# Patient Record
Sex: Female | Born: 1945
Health system: Southern US, Community
[De-identification: ages and names within clinical notes are randomized; demographics above are authoritative.]

## PROBLEM LIST (undated history)

## (undated) DIAGNOSIS — R51 Headache: Secondary | ICD-10-CM

## (undated) DIAGNOSIS — G8929 Other chronic pain: Secondary | ICD-10-CM

## (undated) DIAGNOSIS — M47815 Spondylosis without myelopathy or radiculopathy, thoracolumbar region: Secondary | ICD-10-CM

## (undated) DIAGNOSIS — Z860100 Personal history of colon polyps, unspecified: Secondary | ICD-10-CM

## (undated) DIAGNOSIS — R350 Frequency of micturition: Secondary | ICD-10-CM

## (undated) DIAGNOSIS — M549 Dorsalgia, unspecified: Secondary | ICD-10-CM

## (undated) DIAGNOSIS — R112 Nausea with vomiting, unspecified: Secondary | ICD-10-CM

## (undated) DIAGNOSIS — I219 Acute myocardial infarction, unspecified: Secondary | ICD-10-CM

## (undated) DIAGNOSIS — I10 Essential (primary) hypertension: Secondary | ICD-10-CM

## (undated) DIAGNOSIS — R252 Cramp and spasm: Secondary | ICD-10-CM

## (undated) DIAGNOSIS — E785 Hyperlipidemia, unspecified: Secondary | ICD-10-CM

## (undated) DIAGNOSIS — K769 Liver disease, unspecified: Secondary | ICD-10-CM

## (undated) DIAGNOSIS — M779 Enthesopathy, unspecified: Secondary | ICD-10-CM

## (undated) DIAGNOSIS — M4310 Spondylolisthesis, site unspecified: Secondary | ICD-10-CM

## (undated) DIAGNOSIS — E039 Hypothyroidism, unspecified: Secondary | ICD-10-CM

## (undated) HISTORY — DX: Hyperlipidemia, unspecified: E78.5

## (undated) HISTORY — DX: Spondylosis without myelopathy or radiculopathy, thoracolumbar region: M47.815

## (undated) HISTORY — DX: Liver disease, unspecified: K76.9

## (undated) HISTORY — DX: Hypothyroidism, unspecified: E03.9

## (undated) HISTORY — PX: COLONOSCOPY: SHX174

## (undated) NOTE — Unmapped External Note (Signed)
 Formatting of this note might be different from the original. VSS.  No acute changes.  Pt is on RA.  She has denied any chest pain, dizziness or heart palpitations.  Cleared to discharge by physician.  All belongings are w/ pt.  She has been educated on all new medications and follow-up appointments.  Problem: Infection Risk Goal: Stabilize- Infection Risk Description: Increased chance of contamination with disease-producing germs. Outcome: Adequate for Discharge  Problem: Fall Risk Goal: Stabilize- Fall Risk Description: Increased chance of conditions that results in falls. Outcome: Adequate for Discharge  Electronically signed by Almetta Fitch, RN at 10/19/2023  2:51 PM CDT

## (undated) NOTE — ED Provider Notes (Signed)
 Formatting of this note is different from the original.  History of Present Illness  Mode of arrival: EMS History was obtained from: Patient   Chief Complaint  Patient presents with   Chest Pain    Chest heaviness started during the night with diaphoresis, had MI in December 2024, stent in place   60 y.o. female patient visiting from [place] , with a PMHx of MI 01/2023 with 2 stent placement requirement, who is brought to ED by EMS due to chest heaviness that started this morning at around 4 AM. States she felt non-radiating chest heaviness, nausea, did vomit, and became diaphoretic. She became concerned as her prior MI presented somewhat similarly. Endorses SOB stating she has been SOB ever since last MI. States she recently had an appointment with a pulmonologist back in [place] to establish care. Denies use of CPAP and/or other oxygen  supplementation requirement. Denies cough, sore throat, fever, chills. Denies falls, and/or chest wall trauma. Denies abdominal pain, urinary symptoms. No other concerns.   My review of outside medical records: Unavailable.  PMHx   Past Medical History[1] No current outpatient medications on file.   PSHx   Past Surgical History[2]  Family Hx   Reviewed. None pertinent.  Family History[3]  Social Hx   Non-smoker, Denies drug use, DeniesEtOH use Social History[4]  Allergies   Reviewed by me.  Allergies[5]   Review of Systems   Please see HPI for all pertinent positives and negatives. All other systems reviewed and are negative.  Physical Exam  BP 128/75 (BP Location: Right arm, Patient Position: Lying)   Pulse 69   Temp 98.8 F (37.1 C) (Oral)   Resp 17   Ht 1.626 m (5' 4)   Wt 47.5 kg (104 lb 12.8 oz)   SpO2 97%   BMI 17.99 kg/m   Constitutional: Alert and Oriented x 3. Appears well-developed and well-nourished. No acute distress.  HEENT:  Head: Normocephalic and atraumatic.  Eyes: Conjunctivae without abnormality. EOM  intact.  Ears: Normal external inspection.  Nose: Normal external inspection. Mouth/Throat: External mouth normal.  Neck: Normal range of motion. Neck supple.  Cardiovascular: Normal rate, regular rhythm. No murmurs, rubs or gallops. Bilateral radial pulses present and equal. Pulmonary/Chest: No respiratory distress. Clear to ascultation bilaterally. No wheezes, rales, or rhonchi. Abdominal: abdomen soft, non-distended, non-tender to palpation.  Musculoskeletal: Normal ROM. Steady gait. No edema.  Neurological: Alert. Normal coordination.  Skin: Skin is warm. No rash noted.  Psychiatric: Affect is appropriate.   Nursing notes and vitals reviewed.  LABS   Labs Reviewed  COMPLETE BLOOD COUNT WITH DIFFERENTIAL - Abnormal; Notable for the following components:      Result Value   WBC 12.5 (*)    Neutrophils Absolute 9.34 (*)    Monocytes # 1.04 (*)    All other components within normal limits  COMPREHENSIVE METABOLIC PANEL - Abnormal; Notable for the following components:   Glucose 153 (*)    Anion Gap 16 (*)    All other components within normal limits  D-DIMER QUANTITATIVE - Abnormal; Notable for the following components:   D Dimer Quant 0.64 (*)    All other components within normal limits   Narrative:    The quantitative D-Dimer assay has a high NEGATIVE predictive value for deep vein thrombosis and acute pulmonary embolus, however, the positive predictive value is poor. If the quantitative D-Dimer is less than 0.5 mg/L FEU then there is a low probability of PE and DVT.  Elevated D-Dimer  may have multiple other etiologies, for example, surgery, liver disease, trauma, DIC, pregnancy, etc.  For work-up of DIC, please order the DIC evaluation.   TROPONIN I HIGH SENSITIVITY - Abnormal; Notable for the following components:   Troponin I High Sensitivity 37 (*)    All other components within normal limits  TROPONIN I HIGH SENSITIVITY - Abnormal; Notable for the following components:    Troponin I High Sensitivity 43 (*)    All other components within normal limits  TSH + T4 THYROXINE FREE - Abnormal; Notable for the following components:   Thyroid  Stimulating Hormone 0.054 (*)    All other components within normal limits  TROPONIN I HIGH SENSITIVITY - Abnormal; Notable for the following components:   Troponin I High Sensitivity 42 (*)    All other components within normal limits  COMPLETE BLOOD COUNT WITH DIFFERENTIAL - Abnormal; Notable for the following components:   Monocytes # 0.90 (*)    All other components within normal limits  BASIC METABOLIC PANEL - Abnormal; Notable for the following components:   Glucose 169 (*)    All other components within normal limits  TROPONIN I HIGH SENSITIVITY - Normal  VENOUS BLOOD GASES, LAB - Normal  NT-PRO B-TYPE BNP   IMAGING   CTA Chest PE protocol w Contrast  Final Result  Impression:  No evidence of pulmonary embolus.     Note: This CT exam was performed using one or more of the following  dose reduction techniques: Automated exposure control, adjustment of  the mA and/or kV according to patient size, or use of iterative  reconstruction technique   Signed by: Dorn Finney MD on 10/19/2023 11:08 AM MDT  Reading Workstation:[id number]   X-ray Portable Chest 1 view  Final Result  Findings and Impression:       No evidence of focal consolidation. The cardiomediastinal  silhouette is within normal limits.  Leads overlie the right lower  chest and there appears to be a spinal stimulator in the midthoracic  region.     Signed by: Dorn Finney MD on 10/18/2023 11:24 AM MDT  Reading Workstation:[id number]    EKG   10/18/2023 10:54 AM - [facility], [facility] Orders And Imaging Results In      Result Text     Sinus rhythm with premature atrial complexes Anterior infarct , age undetermined Abnormal ECG No previous ECGs available Confirmed by Carlette Rigg ([id number]) on 10/18/2023 10:54:23  AM      ED MEDS   All Medication Orders (From admission, onward)    Start Ordered     Status Ordering Provider   10/19/23 1003 10/19/23 1003  iopamidol (ISOVUE-370) 76 % injection 100 mL  IMG once as needed       Route: Intravenous  Ordered Dose: 100 mL    Last MAR action: Given PITEO, BRIAN   10/19/23 0900 10/18/23 1513  tiotropium (Spiriva  HandiHaler) capsule 18 mcg  Daily (Respiratory Use)       Route: Inhalation  Ordered Dose: 18 mcg    Last MAR action: Given DE SOUZA SANTOS, MARIA   10/19/23 0900 10/19/23 0800  amLODIPine  (Norvasc ) tablet 5 mg  Daily       Route: Oral  Ordered Dose: 5 mg    Last MAR action: Given Late PITEO, BRIAN   10/19/23 0800 10/18/23 1513  gabapentin  (Neurontin ) capsule 300 mg  3 times daily       Route: Oral  Ordered Dose: 300 mg    Last MAR action: Given  DE SOUZA SANTOS, MARIA   10/19/23 0630 10/18/23 1513  levothyroxine  (Synthroid ) tablet 100 mcg  Every morning at 0630       Route: Oral  Ordered Dose: 100 mcg    Last MAR action: Given DE SOUZA SANTOS, MARIA   10/18/23 2100 10/18/23 1513  losartan  (Cozaar ) tablet 100 mg  Nightly       Route: Oral  Ordered Dose: 100 mg    Last MAR action: Given DE SOUZA SANTOS, MARIA   10/18/23 2100 10/18/23 1513  metoprolol  succinate (Toprol -XL) 24 hr tablet 25 mg  Nightly       Route: Oral  Ordered Dose: 25 mg    Last MAR action: Given DE SOUZA SANTOS, MARIA   10/18/23 2100 10/18/23 1513  prasugrel  (Effient ) tablet 10 mg  Nightly       Route: Oral  Ordered Dose: 10 mg    Last MAR action: Given DE SOUZA SANTOS, MARIA   10/18/23 2100 10/18/23 1513  hydrOXYzine  (Atarax ) tablet 25 mg  Nightly PRN       Route: Oral  Ordered Dose: 25 mg    Last MAR action: Given DE SOUZA SANTOS, MARIA   10/18/23 1536 10/18/23 1536  ondansetron  (Zofran ) injection 4 mg  Every 6 hours PRN       Route: Intravenous  Ordered Dose: 4 mg    Acknowledged DE SOUZA SANTOS, MARIA   10/18/23 1515 10/18/23 1513  aspirin  chewable tablet 81  mg  Daily       Route: Oral  Ordered Dose: 81 mg    Last MAR action: Given DE SOUZA SANTOS, MARIA   10/18/23 1515 10/18/23 1513  DULoxetine  (Cymbalta ) DR capsule 60 mg  Daily       Route: Oral  Ordered Dose: 60 mg    Last MAR action: Given DE SOUZA SANTOS, MARIA   10/18/23 1515 10/18/23 1513  rosuvastatin  (Crestor ) tablet 20 mg  Daily       Route: Oral  Ordered Dose: 20 mg    Last MAR action: Given DE SOUZA SANTOS, MARIA   10/18/23 1512 10/18/23 1513  oxyCODONE -acetaminophen  (Percocet) 5-325 MG per tablet 1 tablet  2 times daily PRN       Route: Oral  Ordered Dose: 1 tablet    Last MAR action: Given DE SOUZA SANTOS, MARIA   10/18/23 1512 10/18/23 1513  hydrALAZINE  (Apresoline ) tablet 25 mg  3 times daily PRN       Route: Oral  Ordered Dose: 25 mg    Last MAR action: Given DE SOUZA SANTOS, MARIA   10/18/23 1510 10/18/23 1507  enoxaparin (Lovenox) syringe 40 mg  Daily       Route: Subcutaneous  Ordered Dose: 40 mg    Last MAR action: Given DE ELIN HEROLD IP     ED COURSE     ED Course as of 10/19/23 1340  Sun Oct 18, 2023  0924 Complete Blood Count with Differential(!) CBC/Diff with mild leukocytosis, negative for anemia requiring ED intervention. [CM]  1019 Plan of care discussed with Dr. Ottie [CM]  1023 EKG 12 lead My independent review and interpretation of EKG indicative of sinus rhythm, heart rate of 82 bpm, no ST elevations.  Left axis deviation. [CM]  1024 Comprehensive Metabolic Panel(!) CMP largely within normal limits.  Good renal function.  Negative for transaminitis. [CM]  1024 Troponin I High Sensitivity High-sensitivity troponin 34  [CM]  1122 Pro BNP [CM]  1123 D-Dimer Quantitative(!): 0.64 D-dimer mildly elevated per  years algorithm, PE can be excluded.  Given patient's clinical presentation there is very low index suspicion for PE. [CM]  1124 X-ray Portable Chest 1 view My independent review interpretation of chest x-ray imaging, bilateral lungs are  visualized and well-expanded. Negative for pneumothorax.  Negative for osseous misalignment.  Cardiomediastinal silhouette within normal limits.  [CM]  1212 Troponin I High Sensitivity(!!): 37 Repeat troponin 37, slightly higher than previous obtained which was at 34.  Discussed with patient.  Shared decision making conversation regarding admission.  Patient is agreeable to plan. [CM]  1243 Plan for admission discussed with hospitalist.  All amenable to plan. [CM]   ED Course User Index [CM] Marin-Villalobos, Will RIGGERS   MDM  This is a 72 y.o. female patient visiting from [place] , with a PMHx of HLD, HTN, MI 01/2023 with 2 stent placement requirement, who is brought to ED by EMS due to chest heaviness that started this morning. Patient's history and presentation raise concern for MI/ACS, vs./Less likely PE, vs. pneumonia, and/or other acute infectious process. Considering patient's history of MI, and high risk for ACS as well as patient subjectively reporting her symptoms are similar to current symptoms. Patient is considered for admission for further cardiology evaluation.  Please see ED course for my independent review of radiology images, laboratory results, and disposition.   The decision to admit the patient involves complex medical judgment, taking into account their medical history, comorbidities, symptom severity, potential risks of non-admission, and current medical needs. Mrs. Cerreta's signs and symptoms are severe enough to require inpatient care that can be provided safely and effectively. Shared decision making conversation with the patient on decisions regarding care took place. She is amenable to plan.  My interpretation of bedside monitor: Normal sinus rhythm. Normal rate. No cardiac events noted during ED stay.   ED return precautions: Patient was advised to return to Emergency Department with new, worsening, and/or concerning symptoms.      Clinical Impression   1.  Chest heaviness     2. History of MI (myocardial infarction)      ED Dispo  Admit  Dr.Kinlaw was available for supervision.  Current Discharge Medication List     Follow-up Information     [facility]. Go today.   Specialty: Emergency Medicine Why: If symptoms worsen Contact information: [address] [place]  [address] [phone]          Dragon Disclaimer: This document was created using Dragon voice to text technology. Please excuse minor typos, word substitutions and errors in grammar and punctuation. Barring these minor misprints, the content of this document is true to the context of patient history and the care episode. Please contact me if corrections or clarifications are necessary.     [1] History reviewed.  No pertinent past medical history. [2] No past surgical history on file. [3] No family history on file. [4]  Social History Tobacco Use   Smoking status: Never   Smokeless tobacco: Never  Substance Use Topics   Alcohol  use: Never   Drug use: Never  [5]  Allergies Allergen Reactions   Penicillins Itching   Bee Venom Itching, Rash and Swelling   Sulfisoxazole Itching    Marin-Villalobos, Will, PA-C 10/19/23 1340  Electronically signed by Mina Will, PA-C at 10/19/2023  2:40 PM CDT

## (undated) NOTE — Progress Notes (Signed)
 Formatting of this note might be different from the original. Care Management Initial Assessment Patient Name: Amanda Kim MRN: [id number] Visit Number (CSN): [id number] Insurance: Payor: /   Plan:  Patient's information was reviewed by chart review. Patient is being followed for Chest heaviness [R07.89] NSTEMI (non-ST elevated myocardial infarction) (HC Category) [I21.4] History of MI (myocardial infarction) [I25.2] .   She is admitted under Observation  status. Current risk of readmission is 9 %.   Met with patient at bedside. She is a 54 y/o A/O Female who resides in [place]  . Patient lives with spouse . Verbal consent was given from patient to speak to husband if needed.  Patient states She has a good support system with her husband.   Patient reports that prior to this admission he was with Independent  ADL's and used No DME      Primary care physician is not collected. Health care coverage is through  Medicare advantage plan .    Discharge plan to be determined dependent on pt's clinical progress. CM discussed discharge care levels which include IRU ,SNF, outpatient therapy and home health services (guide to the next level of care given).    CM anticipating discharge Home     Family to Provide Transport  on discharge.   CM updated white board with Case Manager's name, contact information, and discharge planning information.   CM will continue to follow patient for any discharge needs.   Assessed Patient By Assessed the patient by: (P) In person interview;Medical record review Name: (P) Emmarie Contact's Relationship to Patient: (P) Patient  Interpreter Services Does the patient or family require interpretation?: (P) No Did the patient decline [facility] approved interpretation?: (P) No  Demographics Admitted from: (P) ED Who assists you with performing your ADLs?: (P) Self  Patient Information ADL Prior to Hospitalization: (P) Independent Current  living situation: (P) House Living Arrangements: (P) Spouse / Significant Other Support System: (P) Immediate family Responsibilities/Dependents at home: (P) No Currently using DME?: (P) No Needs additional DME?: (P) No Current Post-Acute Services: (P) N/A Currently receiving outpatient dialysis?: (P) No  Discharge Plan Anticipated discharge plan: (P) Home Transport to discharge destination is: (P) Health and safety inspector arrangements finalized?: (P) Yes  Financial Information Insurance listed correct?: (P) No Insurance actions taken: (P) Registration needs to collect information. Barriers to obtaining healthcare: (P) No Resources provided?: (P) No  Clinical Information / Patient History Have you been hospitalized within the last year?: (P) Yes How long ago?: (P) 6 months What were you admitted for?: (P) Heart attack and neck surgery At what hospital were you admitted?: (P) [facility] Patient and/or family were provided with choice of facilities/services that are available and appropriate to meet post hospital care needs?: (P) Home - no needs Patient/Family agree with discharge plan?: (P) Yes  Completed by: Fonda Bales, LMSW   [facility] [address] [place] [address] Phone: [phone] Fax: [phone]  Electronically signed by Bales Fonda, LMSW at 10/19/2023 10:29 AM CDT

## (undated) NOTE — ED Notes (Signed)
 Formatting of this note might be different from the original. Bed: ED23 Expected date:  Expected time:  Means of arrival:  Comments: ems  Detra Iha, RN 10/18/23 9145  Electronically signed by Detra Iha, RN at 10/18/2023  9:54 AM CDT

## (undated) NOTE — Unmapped External Note (Signed)
 Formatting of this note might be different from the original.  Problem: Infection Risk Goal: Stabilize- Infection Risk Description: Increased chance of contamination with disease-producing germs. Outcome: Stabilized  Problem: Fall Risk Goal: Stabilize- Fall Risk Description: Increased chance of conditions that results in falls. Outcome: Stabilized  Electronically signed by Laymon Seltzer., RN at 10/18/2023  6:41 PM CDT

## (undated) NOTE — Discharge Summary (Signed)
 Formatting of this note is different from the original.  Physician Discharge Summary  Patient ID: Amanda Kim [id number] 78 y.o. [birth year]  Admit date: 10/18/2023  Discharge date:10/19/2023  Admitting Physician: Hadassah everitt Elin Herold, PA-C   Discharge Physician: Redell Muir, DO  Admission Diagnoses: Chest heaviness [R07.89] NSTEMI (non-ST elevated myocardial infarction) (HC Category) [I21.4] History of MI (myocardial infarction) [I25.2]  Discharge Diagnoses: Principal Problem:   NSTEMI (non-ST elevated myocardial infarction) (HC Category) Active Problems:   CAD (coronary artery disease)   Essential hypertension   Hx of chronic arthritis   Hypothyroid   Hyperlipidemia   Acute anxiety   Chronic pain   Depression  RR:[id number] (non-ST elevated myocardial infarction) [facility] Category)  Hospital Course: Patient presented to the emergency department with general unwellness and nausea.  These are symptoms similar to when she had previous MI.  She went out to dinner and did not take her blood pressure medication.  When she checked her blood pressures her systolics were in the 200s.  In the emergency department she had a slight elevation in her troponins that was thought to be secondary to her elevated blood pressure.  CT PA was negative.  Patient was back to her normal state of health on discharge and her blood pressures were controlled.  At this point, I feel patient has reached maximum benefit from this hospitalization and can be safely discharged. Patient and family were counseled about the diagnosis, prognosis and medications side effects. They have been asked to return to ER with any new or worsening symptoms. They voiced understanding and agree to comply.  Patient was seen and examined on the day of discharge and voices no complaints  Follow-up needed: -Follow-up with cardiology  Physical Exam: Vitals: Temp:  [36.7 C (98.1 F)-37.2 C (99 F)] 37.1 C (98.8 F) Heart  Rate:  [69-78] 69 Resp:  [17-18] 17 BP: (128-175)/(68-102) 128/75 GEN: well developed, well nourished, in NAD  EYES:  EOMI, no scleral icterus ENT: MMM HEART: Regular rate LUNGS: respiratory effort non-labored ABDOMEN: Nondistended SKIN: warm, dry, NEURO: No aphasia no facial asymmetry  Discharged Condition: good  Discharge Prognosis:  good  Diet: The patient is asked to make an attempt to improve diet and exercise patterns to aid in medical management of this problem. cardiac diet  Follow up with: PCP  Discharge Meds:   Medication List    CONTINUE taking these medications    aspirin  81 mg chewable tablet  DULoxetine  60 MG delayed-release particle capsule Commonly known as: Cymbalta   Effient  10 MG tablet Generic drug: prasugrel   gabapentin  300 MG capsule Commonly known as: Neurontin   hydrALAZINE  25 MG tablet Commonly known as: Apresoline   hydrOXYzine  25 MG tablet Commonly known as: Atarax   levothyroxine  100 MCG tablet Commonly known as: Synthroid   losartan  100 MG tablet Commonly known as: Cozaar   metoprolol  succinate 25 MG 24 hr sustained-release tablet Commonly known as: Toprol -XL  oxyCODONE -acetaminophen  5-325 MG tablet Commonly known as: Percocet  rosuvastatin  20 MG tablet Commonly known as: Crestor   Spiriva  Respimat 1.25 MCG/ACT aerosol inhaler Generic drug: tiotropium     Radiology/Imaging CTA Chest PE protocol w Contrast Result Date: 10/19/2023 CTA chest with contrast Clinical History: Chest heaviness;History of MI (myocardial infarction) Technique: CT angiogram of the chest with 100cc of intravenous Isovue 370 contrast. Coronal MIP images also obtained. Comparison: None. Findings: There is no pulmonary embolus. Small mediastinal and hilar lymph nodes. No pulmonary nodules or masses. Mild bilateral atelectasis. Coronary atherosclerotic disease with coronary stent in the  LAD. Gallbladder is surgically absent.  Impression: No evidence of  pulmonary embolus. Note: This CT exam was performed using one or more of the following dose reduction techniques: Automated exposure control, adjustment of the mA and/or kV according to patient size, or use of iterative reconstruction technique Signed by: Dorn Finney MD on 10/19/2023 11:08 AM MDT Reading Workstation:[id number]  X-ray Portable Chest 1 view Result Date: 10/18/2023  XR CHEST 1 VIEW PORTABLE History:  Chest pain Chest pain Comparison:  none   Findings and Impression:     No evidence of focal consolidation. The cardiomediastinal silhouette is within normal limits.  Leads overlie the right lower chest and there appears to be a spinal stimulator in the midthoracic region. Signed by: Dorn Finney MD on 10/18/2023 11:24 AM MDT Reading Workstation:[id number]  Activity: activity as tolerated  I have seen and examined this patient on the day of discharge.  Total time spent on med rec, discharge planning, with over half of the time in direct patient care >71min  Signed: Brian Piteo, DO 10/19/2023 1:49 PM MDT  Speech recognition transcription software was used to create portions of this document. An attempt at proofreading has been made to minimize errors. Please call for corrections.  Electronically signed by Laine Rogue, DO at 10/19/2023  2:54 PM CDT

## (undated) NOTE — ED Provider Notes (Signed)
 Formatting of this note is different from the original.  Attending Physician Addendum Note   I provided a substantive portion of the care of this patient. I personally performed the entirety of the MDM for this encounter. See below for pertinent documentation to the extent needed to support the assigned E/M code.  MDM   I personally made the management plan and take responsibility for the patient management.  I have interviewed the patient, reviewed the case with my physicians assistant Oscar Boas.  Patient relatively high risk for acute coronary syndrome.  Presents with chest pain nausea and vomiting.  Troponins stable however due to the patient's high heart score recommend admission to the hospital for continued observation, serial troponins and telemetry.  The patient is comfortable with this admission plan    Diagnosis   1. Chest heaviness     2. History of MI (myocardial infarction)      Disposition  Admit  Elsie MARLA Macadam, MD  Macadam Elsie MARLA, MD Physician 10/18/23 [phone]  Electronically signed by Macadam Elsie MARLA, MD at 10/18/2023  7:05 PM CDT

## (undated) NOTE — H&P (Signed)
 Formatting of this note is different from the original. Hospitalist Admission History & Physical  PCP: Csv No Pcp, Per Patient  Chief Complaint: Nausea chest pressure  HPI: Amanda Kim is a 53 y.o. female with a past medical history that includes coronary artery disease (reports 2 heart attacks within the past year, second was in December and required catheterization with placement of 2 stents), dyslipidemia, hypertension, hypothyroidism, chronic pain, COPD(?), depression who is presenting to the emergency room this morning for feeling of general unwellness and nausea.  Reports symptoms are similar to what she has had when she had her prior MI.   States went out to dinner last night and had a drink with friends, did not take her blood pressure medications.  Measured her blood pressure and it was in the 200s. This morning went to [facility] and was feeling nauseous and just felt that something was off so told her husband that she thought that should go to the hospital.  Denies chest pain but does endorse some chest pressure.   Note is visiting from [place] .   Past Medical History: Past Medical History[1]  Past Surgical History: Past Surgical History[2]  Allergies: Allergies[3]  Meds:  Prior to Admission medications  Not on File   Social History:  Social History   Tobacco Use   Smoking status: Not on file   Smokeless tobacco: Not on file  Substance Use Topics   Alcohol  use: Not on file   No existing history information found.No existing history information found.No existing history information found.No existing history information found.  Family History: Family History[4]    Review of Systems: Review of Systems  Constitutional: Negative.   HENT: Negative.    Respiratory: Negative.    Cardiovascular:  Negative for chest pain.  Gastrointestinal:  Positive for nausea.  Genitourinary: Negative.   Neurological: Negative.    Physical Exam: Vitals:Temp:   [36.8 C (98.2 F)] 36.8 C (98.2 F) Heart Rate:  [78-94] 94 Resp:  [14-16] 16 BP: (109-172)/(74-94) 172/82  Constitutional: She  is alert. No distress.  HENT:  Head: Normocephalic.  Eyes: Pupils are equal, round, and reactive to light.  Cardiovascular: Normal rate, regular rhythm and intact distal pulses.  Pulmonary/Chest: Effort normal. No respiratory distress.  Abdominal: Soft. Bowel sounds are normal. There is no abdominal tenderness.  Musculoskeletal:        General: No edema.     Cervical back: Normal range of motion.   Labs: CBC Lab Results  Component Value Date   WBC 12.5 (H) 10/18/2023   HGB 14.0 10/18/2023   HCT 39.7 10/18/2023   MCV 88.8 10/18/2023   PLT 283 10/18/2023   RENAL PROFILE Recent Labs  Lab 10/18/23 0907  GLU 153*  CALCIUM  9.6  NA 141  K 3.4  CO2 21  CL 104  BUN 13  CREATININE 0.80   Labs reviewed  Imaging:  X-ray Portable Chest 1 view Narrative:  XR CHEST 1 VIEW PORTABLE  History:  Chest pain Chest pain  Comparison:  none Impression: Findings and Impression:      No evidence of focal consolidation. The cardiomediastinal silhouette is within normal limits.  Leads overlie the right lower chest and there appears to be a spinal stimulator in the midthoracic region.  Signed by: Dorn Finney MD on 10/18/2023 11:24 AM MDT Reading Workstation:[id number] EKG 12 lead Sinus rhythm with premature atrial complexes Anterior infarct , age undetermined Abnormal ECG No previous ECGs available Confirmed by Carlette Rigg ([id number]) on 10/18/2023  10:54:23 AM  Imaging and radiology reports reviewed  Assessment & Plan:  78 y.o. female with a past medical history that includes coronary artery disease, hypertension who is presenting for nausea and chest pressure-with slight elevation in troponin.   NSTEMI History of coronary artery disease Dyslipidemia - Type II - Troponin 34-37, continue to trend troponin -Monitor on telemetry -  Continue with aspirin  and Effient  - Continue with statin - Continue with home antihypertensives: metoprolol , Cozaar , hydralazine  as needed - Suspect elevation is secondary to increased blood pressure  Chronic pain Depression Insomnia Anxiety -Continue with home duloxetine , hydroxyzine  as needed - Continue with Percocet 3 times daily as needed  Possible COPD - [place] was recently started on tiotropium - Is unclear if she has an official diagnosis of COPD however mentions emphysema - Continue with tiotropium  Hypothyroidism -Continue with home Synthroid  -Check thyroid  function studies  DVT prophylaxis: Lovenox SQ Code status: Patient is full code. Surrogate decision maker is Sister Glendale Burkes [phone]  Please also refer to Dr. Leann attestation.  Hadassah Everitt Elin Herold, [place]  OBSERVATION STATEMENT:  I certify that based upon my best clinical judgment that the patient's condition as documented meets the requirement for OBSERVATION level of care.  Teale Goodgame Knag[place] requires treatment and I anticipate the need for hospital services to be less than 2 midnights at this time.  Speech recognition transcription software was used to create portions of this document. An attempt at proofreading has been made to minimize errors. Please call for corrections.    [1] History reviewed.  No pertinent past medical history. [2] No past surgical history on file. [3]  Allergies Allergen Reactions   Penicillins Itching   Bee Venom Itching, Rash and Swelling   Sulfisoxazole Itching  [4] No family history on file.  Cosigned by Leann Fonda BIRCH, MD at 10/20/2023  5:32 PM MDT Electronically signed by everitt Elin Herold Hadassah, PA-C at 10/19/2023  8:02 AM CDT Electronically signed by Leann Fonda BIRCH, MD at 10/20/2023  6:32 PM CDT  Associated attestation - Sifuentes, Fonda BIRCH, MD - 10/20/2023  6:32 PM CDT Formatting of this note might be different from the original. .I  have discussed the plan of care with de Elin Herold, Hadassah PA I have reviewed the patient's documentation, and agree with the plan of care as written below.

---

## 1979-02-18 HISTORY — PX: TUBAL LIGATION: SHX77

## 1997-11-27 ENCOUNTER — Other Ambulatory Visit: Admission: RE | Admit: 1997-11-27 | Discharge: 1997-11-27 | Payer: Self-pay | Admitting: *Deleted

## 1998-02-12 ENCOUNTER — Ambulatory Visit (HOSPITAL_COMMUNITY): Admission: RE | Admit: 1998-02-12 | Discharge: 1998-02-12 | Payer: Self-pay | Admitting: *Deleted

## 2007-06-18 ENCOUNTER — Encounter: Admission: RE | Admit: 2007-06-18 | Discharge: 2007-06-18 | Payer: Self-pay | Admitting: Family Medicine

## 2008-02-29 ENCOUNTER — Emergency Department (HOSPITAL_COMMUNITY): Admission: EM | Admit: 2008-02-29 | Discharge: 2008-02-29 | Payer: Self-pay | Admitting: Emergency Medicine

## 2008-03-07 ENCOUNTER — Emergency Department (HOSPITAL_COMMUNITY): Admission: EM | Admit: 2008-03-07 | Discharge: 2008-03-07 | Payer: Self-pay | Admitting: Family Medicine

## 2008-07-13 ENCOUNTER — Encounter: Admission: RE | Admit: 2008-07-13 | Discharge: 2008-07-13 | Payer: Self-pay | Admitting: *Deleted

## 2008-07-14 ENCOUNTER — Encounter: Admission: RE | Admit: 2008-07-14 | Discharge: 2008-07-14 | Payer: Self-pay | Admitting: *Deleted

## 2008-07-21 ENCOUNTER — Encounter: Admission: RE | Admit: 2008-07-21 | Discharge: 2008-07-21 | Payer: Self-pay | Admitting: *Deleted

## 2010-03-11 ENCOUNTER — Encounter: Payer: Self-pay | Admitting: *Deleted

## 2010-04-30 ENCOUNTER — Other Ambulatory Visit: Payer: Self-pay | Admitting: *Deleted

## 2010-04-30 DIAGNOSIS — Z1231 Encounter for screening mammogram for malignant neoplasm of breast: Secondary | ICD-10-CM

## 2010-05-02 ENCOUNTER — Ambulatory Visit
Admission: RE | Admit: 2010-05-02 | Discharge: 2010-05-02 | Disposition: A | Discharge status: Home / Self Care | Payer: BC Managed Care – PPO | Source: Ambulatory Visit | Attending: *Deleted | Admitting: *Deleted

## 2010-05-02 DIAGNOSIS — Z1231 Encounter for screening mammogram for malignant neoplasm of breast: Secondary | ICD-10-CM

## 2010-05-06 ENCOUNTER — Other Ambulatory Visit: Payer: Self-pay | Admitting: *Deleted

## 2010-05-06 DIAGNOSIS — R928 Other abnormal and inconclusive findings on diagnostic imaging of breast: Secondary | ICD-10-CM

## 2010-06-13 ENCOUNTER — Ambulatory Visit
Admission: RE | Admit: 2010-06-13 | Discharge: 2010-06-13 | Disposition: A | Discharge status: Home / Self Care | Payer: BC Managed Care – PPO | Source: Ambulatory Visit | Attending: *Deleted | Admitting: *Deleted

## 2010-06-13 ENCOUNTER — Other Ambulatory Visit: Payer: Self-pay | Admitting: Family Medicine

## 2010-06-13 DIAGNOSIS — R928 Other abnormal and inconclusive findings on diagnostic imaging of breast: Secondary | ICD-10-CM

## 2010-12-23 ENCOUNTER — Other Ambulatory Visit: Payer: Self-pay | Admitting: Family Medicine

## 2010-12-23 ENCOUNTER — Other Ambulatory Visit (HOSPITAL_COMMUNITY): Payer: Self-pay | Admitting: Family Medicine

## 2010-12-23 DIAGNOSIS — N63 Unspecified lump in unspecified breast: Secondary | ICD-10-CM

## 2010-12-23 DIAGNOSIS — Z1231 Encounter for screening mammogram for malignant neoplasm of breast: Secondary | ICD-10-CM

## 2011-01-03 ENCOUNTER — Ambulatory Visit
Admission: RE | Admit: 2011-01-03 | Discharge: 2011-01-03 | Disposition: A | Discharge status: Home / Self Care | Payer: BC Managed Care – PPO | Source: Ambulatory Visit | Attending: Family Medicine | Admitting: Family Medicine

## 2011-01-03 DIAGNOSIS — N63 Unspecified lump in unspecified breast: Secondary | ICD-10-CM

## 2011-04-22 ENCOUNTER — Other Ambulatory Visit: Payer: Self-pay | Admitting: Family Medicine

## 2011-04-22 DIAGNOSIS — N63 Unspecified lump in unspecified breast: Secondary | ICD-10-CM

## 2011-05-02 ENCOUNTER — Ambulatory Visit
Admission: RE | Admit: 2011-05-02 | Discharge: 2011-05-02 | Disposition: A | Discharge status: Home / Self Care | Payer: Medicare Other | Source: Ambulatory Visit | Attending: Family Medicine | Admitting: Family Medicine

## 2011-05-02 DIAGNOSIS — N63 Unspecified lump in unspecified breast: Secondary | ICD-10-CM

## 2011-05-02 DIAGNOSIS — R928 Other abnormal and inconclusive findings on diagnostic imaging of breast: Secondary | ICD-10-CM | POA: Diagnosis not present

## 2011-05-07 ENCOUNTER — Encounter: Payer: Self-pay | Admitting: Family Medicine

## 2011-05-07 ENCOUNTER — Other Ambulatory Visit: Payer: Self-pay | Admitting: Family Medicine

## 2011-05-07 DIAGNOSIS — E039 Hypothyroidism, unspecified: Secondary | ICD-10-CM | POA: Insufficient documentation

## 2011-05-07 DIAGNOSIS — G8929 Other chronic pain: Secondary | ICD-10-CM | POA: Insufficient documentation

## 2011-05-07 HISTORY — DX: Hypothyroidism, unspecified: E03.9

## 2011-05-07 MED ORDER — AMITRIPTYLINE HCL 25 MG PO TABS: 25.0000 mg | ORAL_TABLET | Freq: Every day | ORAL | Status: DC

## 2011-05-07 MED ORDER — LEVOTHYROXINE SODIUM 100 MCG PO TABS: 100.0000 ug | ORAL_TABLET | Freq: Every day | ORAL | Status: DC

## 2011-05-07 MED ORDER — CYCLOBENZAPRINE HCL 10 MG PO TABS: 10.0000 mg | ORAL_TABLET | Freq: Three times a day (TID) | ORAL | Status: DC | PRN

## 2011-05-07 MED ORDER — TRAMADOL HCL 50 MG PO TABS: 50.0000 mg | ORAL_TABLET | Freq: Three times a day (TID) | ORAL | Status: DC | PRN

## 2011-05-20 ENCOUNTER — Ambulatory Visit (INDEPENDENT_AMBULATORY_CARE_PROVIDER_SITE_OTHER): Payer: Medicare Other | Admitting: Family Medicine

## 2011-05-20 ENCOUNTER — Encounter: Payer: Self-pay | Admitting: Family Medicine

## 2011-05-20 ENCOUNTER — Other Ambulatory Visit: Payer: Self-pay | Admitting: Family Medicine

## 2011-05-20 VITALS — BP 122/80 | HR 74 | Temp 98.3°F | Ht 63.75 in | Wt 159.0 lb

## 2011-05-20 DIAGNOSIS — Z13220 Encounter for screening for lipoid disorders: Secondary | ICD-10-CM

## 2011-05-20 DIAGNOSIS — R739 Hyperglycemia, unspecified: Secondary | ICD-10-CM

## 2011-05-20 DIAGNOSIS — E559 Vitamin D deficiency, unspecified: Secondary | ICD-10-CM | POA: Diagnosis not present

## 2011-05-20 DIAGNOSIS — F17200 Nicotine dependence, unspecified, uncomplicated: Secondary | ICD-10-CM

## 2011-05-20 DIAGNOSIS — R5383 Other fatigue: Secondary | ICD-10-CM

## 2011-05-20 DIAGNOSIS — Z136 Encounter for screening for cardiovascular disorders: Secondary | ICD-10-CM

## 2011-05-20 DIAGNOSIS — R7309 Other abnormal glucose: Secondary | ICD-10-CM | POA: Diagnosis not present

## 2011-05-20 DIAGNOSIS — E039 Hypothyroidism, unspecified: Secondary | ICD-10-CM

## 2011-05-20 DIAGNOSIS — Z720 Tobacco use: Secondary | ICD-10-CM

## 2011-05-20 DIAGNOSIS — R5381 Other malaise: Secondary | ICD-10-CM | POA: Diagnosis not present

## 2011-05-20 DIAGNOSIS — G8929 Other chronic pain: Secondary | ICD-10-CM

## 2011-05-20 LAB — CBC WITH DIFFERENTIAL/PLATELET
Eosinophils Absolute: 0.1 10*3/uL (ref 0.0–0.7)
Eosinophils Relative: 1 % (ref 0–5)
HCT: 43.8 % (ref 36.0–46.0)
Lymphs Abs: 2.2 10*3/uL (ref 0.7–4.0)
MCH: 31.1 pg (ref 26.0–34.0)
MCV: 94.6 fL (ref 78.0–100.0)
Monocytes Absolute: 0.8 10*3/uL (ref 0.1–1.0)
Platelets: 290 10*3/uL (ref 150–400)
RBC: 4.63 MIL/uL (ref 3.87–5.11)
RDW: 13.2 % (ref 11.5–15.5)

## 2011-05-20 LAB — LIPID PANEL
Cholesterol: 229 mg/dL — ABNORMAL HIGH (ref 0–200)
Total CHOL/HDL Ratio: 5.9 Ratio
Triglycerides: 205 mg/dL — ABNORMAL HIGH (ref <150)
VLDL: 41 mg/dL — ABNORMAL HIGH (ref 0–40)

## 2011-05-20 LAB — COMPREHENSIVE METABOLIC PANEL
Alkaline Phosphatase: 104 U/L (ref 39–117)
BUN: 14 mg/dL (ref 6–23)
Creat: 0.85 mg/dL (ref 0.50–1.10)
Glucose, Bld: 122 mg/dL — ABNORMAL HIGH (ref 70–99)
Total Bilirubin: 0.5 mg/dL (ref 0.3–1.2)

## 2011-05-20 LAB — TSH: TSH: 2.518 u[IU]/mL (ref 0.350–4.500)

## 2011-05-20 MED ORDER — VENLAFAXINE HCL ER 37.5 MG PO CP24: ORAL_CAPSULE | ORAL | Status: DC

## 2011-05-20 NOTE — Patient Instructions (Signed)
It is very nice to meet you neighbor. I will get some labs and I will call you with the results. I'm giving you a new medicine to try called Effexor. I want you to take one pill daily for the first week then 2 pills daily thereafter. If you need a refill of any of your other medications please just call or have the pharmacy call please. I when she to schedule appointment with Rosalita Chessman for you're welcome to Medicare visit. Let's make this visit in 2-4 weeks Also make an appointment with Dr. Raymondo Band to help you with quit smoking. Remember your quit date is June 1.

## 2011-05-20 NOTE — Assessment & Plan Note (Signed)
We'll get TSH today and adjust accordingly will call patient with results.

## 2011-05-20 NOTE — Assessment & Plan Note (Signed)
Patient is a relatively healthy 66 year old female other than her continuing to smoke. Patient still concerned that she might gain weight after smoking. Hopefully the Effexor will help with that potential side effect. Patient will follow up with Dr. Raymondo Band in the near future for smoking cessation.

## 2011-05-20 NOTE — Telephone Encounter (Signed)
Rx for Vagifem 1 per day was missing, also for generic Synthroid.  She did get the Rx for Effexor, it would better to go through mail order also.  [facility] ID [id number], [phone].

## 2011-05-20 NOTE — Assessment & Plan Note (Signed)
Patient still has the pain intermittently We will attempt to use Effexor for chronic pain syndrome. Continue Flexeril as needed at night as well as tramadol.

## 2011-05-20 NOTE — Progress Notes (Signed)
  Subjective:    Patient ID: Amanda Kim, female    DOB: Feb 10, 1946, 66 y.o.   MRN: [id number]  HPI 66 year old female here to establish care.  #1 hypothyroidism-patient states she was diagnosed with this decades ago, has had this problem in her family as well. Patient has been on Synthroid for multiple years out from 100 mcg 225 mcg Bextra 100 mcg. Last time TSH was checked and was greater than a year ago. Patient also states that she has had some weight gain and increased fatigue but denies any large or any swelling or any hoarseness of voice.  #2 tobacco dependence-patient is still smoking approximately half pack per day. Patient has quit in the past and even quit for approximately 10 years before. Patient states that she didn't based on willpower. Patient has attempted to quit after those 10 years multiple times including the use of Chantix and Wellbutrin but did not work. Patient has never tried patches. Patient is motivated to try to quit he would like to discuss this with Dr. Raymondo Band patient is putting a quit date of June 1.  #3 chronic pain-patient fell off a horse back in 1981 had significant back pain lower back pain and significant hematoma. Patient had been taking pain medications including Flexeril as well as Vicodin on an as needed basis. Patient attempted to take amitriptyline as well but did not notice any benefit. Patient states that the pain is mostly right lower back intermittent with mild radiation down the leg but no numbness weakness and no bowel or bladder incontinence. Patient is able to do all activities of daily living without any trouble including working out 3-4 times a week and gardening.   Review of Systems Denies fever, chills, nausea vomiting abdominal pain, dysuria, chest pain, shortness of breath dyspnea on exertion or numbness in extremities Past Medical History  Diagnosis Date  . Hypothyroid 05/07/2011    Dx in 2003 on synthroid 100   /psh Family History    Problem Relation Age of Onset  . Hypertension Mother   . Alcohol abuse Mother   . Depression Father   . Bipolar disorder Father   . Colon cancer Paternal Grandmother   . Cancer Paternal Grandmother     colon  . Heart attack Paternal Grandfather   . Heart disease Paternal Uncle   . Hyperlipidemia Paternal Uncle        Objective:   Physical Exam  Constitutional: She is oriented to person, place, and time. She appears well-developed and well-nourished.  HENT:  Head: Normocephalic.  Right Ear: External ear normal.  Left Ear: External ear normal.  Eyes: Conjunctivae and EOM are normal. Pupils are equal, round, and reactive to light.  Neck: Normal range of motion. Neck supple. No thyromegaly present.  Cardiovascular: Normal rate, regular rhythm and normal heart sounds.   No murmur heard. Pulmonary/Chest: Effort normal and breath sounds normal. No respiratory distress.  Abdominal: Soft. Bowel sounds are normal.  Musculoskeletal: Normal range of motion. She exhibits no edema.  Lymphadenopathy:    She has no cervical adenopathy.  Neurological: She is alert and oriented to person, place, and time. No cranial nerve deficit.  Skin: Skin is warm and dry.  Psychiatric: She has a normal mood and affect.      Assessment & Plan:

## 2011-05-21 MED ORDER — LEVOTHYROXINE SODIUM 100 MCG PO TABS: 100.0000 ug | ORAL_TABLET | Freq: Every day | ORAL | Status: DC

## 2011-05-21 MED ORDER — ESTRADIOL 10 MCG VA TABS: ORAL_TABLET | VAGINAL | Status: DC

## 2011-05-21 NOTE — Telephone Encounter (Signed)
Done I will call patient and tell her. Left message.

## 2011-05-23 ENCOUNTER — Telehealth: Payer: Self-pay | Admitting: Family Medicine

## 2011-05-23 NOTE — Telephone Encounter (Signed)
Message copied by Judi Saa on Fri May 23, 2011  4:59 PM ------      Message from: [place], SARAH T      Created: Wed May 21, 2011  9:43 PM                   ----- Message -----         From: [facility] Interface         Sent: 05/20/2011   9:48 PM           To: Sarah T [place], MD

## 2011-05-23 NOTE — Telephone Encounter (Signed)
Called pt back and told her that the labs show elevation in cholesterol and to try red yeast rice and fish oil with diet and exercise and will recheck in 6-8 weeks.  Will run A1c on reflex,  Have pt back in 8 weeks.

## 2011-05-23 NOTE — Progress Notes (Signed)
Addended by: Judi Saa on: 05/23/2011 05:11 PM   Modules accepted: Orders

## 2011-06-02 ENCOUNTER — Ambulatory Visit (INDEPENDENT_AMBULATORY_CARE_PROVIDER_SITE_OTHER): Payer: Medicare Other | Admitting: Pharmacist

## 2011-06-02 ENCOUNTER — Encounter: Payer: Self-pay | Admitting: Pharmacist

## 2011-06-02 VITALS — BP 150/76 | HR 71 | Temp 99.1°F | Ht 63.75 in | Wt 155.0 lb

## 2011-06-02 DIAGNOSIS — Z720 Tobacco use: Secondary | ICD-10-CM

## 2011-06-02 DIAGNOSIS — F17200 Nicotine dependence, unspecified, uncomplicated: Secondary | ICD-10-CM | POA: Diagnosis not present

## 2011-06-02 MED ORDER — VARENICLINE TARTRATE 0.5 MG X 11 & 1 MG X 42 PO MISC: ORAL | Status: DC

## 2011-06-02 NOTE — Assessment & Plan Note (Signed)
moderate Nicotine Dependence of >45 years duration in a patient who is good candidate for success b/c of current level of motivation AND success in the past with quitting.  Multiple successful attempts AND relapses is concerning.  Called new Rx for varenicline tx to be used at 1mg  daily after fist week to attempt to avoid dysphoric symptoms experienced on second use. Patient counseled on purpose, proper use, and potential adverse effects, including GI upset, and potential change in mood.   Written information provided. Provided information on 1 800-QUIT NOW support program.  F/U Rx Clinic Visit May 14th.   Total time in face-to-face counseling 60 minutes.

## 2011-06-02 NOTE — Patient Instructions (Signed)
Chantix to start after pick-up.  Decrease cigarettes to 10 per day by May 1st.   Then follow up in May with Pharmacy Clinic May 14th  Quit date is scheduled for June 1st.

## 2011-06-02 NOTE — Progress Notes (Signed)
  Subjective:    Patient ID: Amanda Kim, female    DOB: [birth year], 66 y.o.   MRN: [id number]  HPI  Age when started using tobacco on a daily basis 21 (started trying cigs at age 58). Number of Cigarettes per day 13-15. Brand smoked Vantage (lights) now [employer]. . Estimated Nicotine Content per Cigarette (mg) 0.5-0.8.  Estimated Nicotine intake per day 8-12 mg.   Smokes first cigarette 15 minutes after waking. Denies waking to smoke.   First Cigarette with IPAD use in AM   Stress response may include relaxation with READING.  Estimated Fagerstrom Score 5-6/10.  Most recent quit attempt 1 year ago (with Chantix) Longest time ever been tobacco free 10 years - many years ago. . What Medications (NRT, bupropion, varenicline) used in past includes nicotine patches for stress - has used chantix in the past with success. Chantix quit attempt #1 (6 years ago) quit for 8 months AND lost job and more recent quit attempt~1 year ago (did NOT quit at all). Rates IMPORTANCE of quitting tobacco on 1-10 scale of 9. Rates READINESS of quitting tobacco on 1-10 scale of 10. Rates CONFIDENCE of quitting tobacco on 1-10 scale of 7. Triggers to use tobacco include; after meals, stress, boredom  Very concerned with the hygiene of smoking.   Hates feeling addicted.   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   Has used Chantix in the past and QUIT for 8 months with use.  Then failed with second attempted use of Chantix due to dysphoria.   Review of Systems     Objective:   Physical Exam        Assessment & Plan:  moderate Nicotine Dependence of >45 years duration in a patient who is good candidate for success b/c of current level of motivation AND success in the past with quitting.  Multiple successful attempts AND relapses is concerning.  Called new Rx for varenicline tx to be used at 1mg  daily after fist week to attempt to avoid dysphoric symptoms experienced on second  use. Patient counseled on purpose, proper use, and potential adverse effects, including GI upset, and potential change in mood.   Written information provided. Provided information on 1 800-QUIT NOW support program.  F/U Rx Clinic Visit May 14th.   Total time in face-to-face counseling 60 minutes.

## 2011-06-05 NOTE — Progress Notes (Signed)
  Subjective:    Patient ID: Amanda Kim, female    DOB: May 31, 1945, 66 y.o.   MRN: [id number]  HPI Reviewed and agree with Dr. Macky Lower management    Review of Systems     Objective:   Physical Exam        Assessment & Plan:

## 2011-06-06 ENCOUNTER — Telehealth: Payer: Self-pay | Admitting: Pharmacist

## 2011-06-06 NOTE — Telephone Encounter (Signed)
Patient needs mail order prescription letter to help change copay level for a medication to generic.

## 2011-06-09 ENCOUNTER — Other Ambulatory Visit: Payer: Self-pay | Admitting: Family Medicine

## 2011-06-09 MED ORDER — VENLAFAXINE HCL ER 37.5 MG PO CP24: 75.0000 mg | ORAL_CAPSULE | Freq: Every day | ORAL | Status: DC

## 2011-06-09 MED ORDER — ESTRADIOL 10 MCG VA TABS: ORAL_TABLET | VAGINAL | Status: DC

## 2011-06-09 MED ORDER — LEVOTHYROXINE SODIUM 100 MCG PO TABS: 100.0000 ug | ORAL_TABLET | Freq: Every day | ORAL | Status: DC

## 2011-06-16 ENCOUNTER — Telehealth: Payer: Self-pay | Admitting: *Deleted

## 2011-06-16 NOTE — Telephone Encounter (Signed)
Pt called back telephone line today.  States that the med's that we sent in the other day, need to be the generic instead of name brand.  States that the generic name must be on the Rx and it also must say DAW (dispense as written).  She will need new rx for Effexor and Synthroid sent in.

## 2011-06-17 MED ORDER — VENLAFAXINE HCL ER 37.5 MG PO CP24: 75.0000 mg | ORAL_CAPSULE | Freq: Every day | ORAL | Status: DC

## 2011-06-17 MED ORDER — LEVOTHYROXINE SODIUM 100 MCG PO TABS: 100.0000 ug | ORAL_TABLET | Freq: Every day | ORAL | Status: DC

## 2011-06-17 NOTE — Telephone Encounter (Signed)
Will try again. Please call patient to tell her.

## 2011-06-18 ENCOUNTER — Other Ambulatory Visit: Payer: Self-pay | Admitting: Family Medicine

## 2011-06-18 ENCOUNTER — Encounter: Payer: Self-pay | Admitting: Home Health Services

## 2011-06-18 ENCOUNTER — Ambulatory Visit (INDEPENDENT_AMBULATORY_CARE_PROVIDER_SITE_OTHER): Payer: Medicare Other | Admitting: Home Health Services

## 2011-06-18 VITALS — BP 117/75 | HR 60 | Temp 98.0°F | Ht 63.75 in | Wt 153.5 lb

## 2011-06-18 DIAGNOSIS — Z139 Encounter for screening, unspecified: Secondary | ICD-10-CM | POA: Diagnosis not present

## 2011-06-18 DIAGNOSIS — Z0000 Encounter for general adult medical examination without abnormal findings: Secondary | ICD-10-CM

## 2011-06-18 DIAGNOSIS — Z23 Encounter for immunization: Secondary | ICD-10-CM

## 2011-06-18 MED ORDER — GABAPENTIN 100 MG PO CAPS: 100.0000 mg | ORAL_CAPSULE | Freq: Three times a day (TID) | ORAL | Status: DC

## 2011-06-18 MED ORDER — CYCLOBENZAPRINE HCL 5 MG PO TABS: 5.0000 mg | ORAL_TABLET | Freq: Three times a day (TID) | ORAL | Status: DC | PRN

## 2011-06-18 NOTE — Telephone Encounter (Signed)
Pt informed. ,  Amanda Kim  

## 2011-06-18 NOTE — Patient Instructions (Signed)
1. Continue to focus on eating fruits and vegetables.  2. Considering working out 150 minutes a week. 3. Continue to work on reducing cigarettes smoked daily.  Quit date July 19, 2011. 4. Please bring in a copy of your living will for Dr. Katrinka Blazing.

## 2011-06-18 NOTE — Progress Notes (Signed)
Patient here for annual wellness visit, patient reports: Risk Factors/Conditions needing evaluation or treatment: Pt does not have any risk factors that need evaluation. Home Safety: Pt lives by self in 1 story home. Pt reports having smoke detectors and adaptive equipment in bathroom. Other Information: Corrective lens: Pt wears daily corrective lens.  Pt visits eye dr every 2 years. Dentures: Pt does not have dentures.  Pt sees her brother Engineer, agricultural) every year. Memory: Pt denies memory problems. Patient's Mini Mental Score (recorded in doc. flowsheet): 30  Balance/Gait: Pt does not have any noticeable impairment Balance Abnormal Patient value  Sitting balance    Sit to stand    Attempts to arise    Immediate standing balance    Standing balance    Nudge    Eyes closed- Romberg    Tandem stance    Back lean    Neck Rotation    360 degree turn    Sitting down     Gait Abnormal Patient value  Initiation of gait    Step length-left    Step length-right    Step height-left    Step height-right    Step symmetry    Step continuity    Path deviation    Trunk movement    Walking stance        Annual Wellness Visit Requirements Recorded Today In  Medical, family, social history Past Medical, Family, Social History Section  Current providers Care team  Current medications Medications  Wt, BP, Ht, BMI Vital signs  Visual acuity (welcome visit) declined  Hearing assessment (welcome visit) declined  Tobacco, alcohol, illicit drug use History  ADL Nurse Assessment  Depression Screening Nurse Assessment  Cognitive impairment Nurse Assessment  Mini Mental Status Document Flowsheet  Fall Risk Nurse Assessment  Home Safety Progress Note  End of Life Planning (welcome visit) Social Documentation  Medicare preventative services Progress Note  Risk factors/conditions needing evaluation/treatment Progress Note  Personalized health advice Patient Instructions, goals, letter  Diet  & Exercise Social Documentation  Emergency Contact Social Documentation  Seat Belts Social Documentation  Sun exposure/protection Social Documentation    Prevention Plan:   Recommended Medicare Prevention Screenings Women over 65 Test For Frequency Date of Last- BOLD if needed  Breast Cancer 1-2 yrs 3/13  Cervical Cancer 1-3 yrs discussed  Colorectal Cancer 1-10 yrs Pt reported done in 2010  Osteoporosis once recommended  Cholesterol 5 yrs 4/13  Diabetes yearly 4/13  HIV yearly declined  Influenza Shot yearly 2012  Pneumonia Shot once 06/18/11  Zostavax Shot once Reported done, asked to bring in immunization record

## 2011-06-18 NOTE — Progress Notes (Signed)
Patient ID: Amanda Kim, female   DOB: Apr 23, 1945, 66 y.o.   MRN: [id number]  I have reviewed this visit and discussed with Arlys John and agree with her documentation.  please see orders only for medication changes. Added Neurontin for patient's leg pain and piriformis syndrome.

## 2011-07-01 ENCOUNTER — Encounter: Payer: Self-pay | Admitting: Pharmacist

## 2011-07-01 ENCOUNTER — Other Ambulatory Visit: Payer: Self-pay | Admitting: Family Medicine

## 2011-07-01 ENCOUNTER — Ambulatory Visit (INDEPENDENT_AMBULATORY_CARE_PROVIDER_SITE_OTHER): Payer: Medicare Other | Admitting: Pharmacist

## 2011-07-01 VITALS — BP 129/79 | HR 64 | Ht 64.0 in | Wt 155.2 lb

## 2011-07-01 DIAGNOSIS — F17200 Nicotine dependence, unspecified, uncomplicated: Secondary | ICD-10-CM | POA: Diagnosis not present

## 2011-07-01 DIAGNOSIS — Z720 Tobacco use: Secondary | ICD-10-CM

## 2011-07-01 NOTE — Patient Instructions (Addendum)
Good to see you today! - Target date of June 1st as stop date - Continue Chantix 2 mg daily (1mg  twice daily with food) as long as no side effects occur - Plan for E-cigarettes set goal of using the "zero" in the month of June and goal to eventually discontinue after June - Nicotine patches - goal of using only in emergencies, i.e. When out all day. Can cut these in half and after June 1st goal of no more than using half of a 21 mg patch - Smoke away and herbal supplements can be continued as helpful to you. Goal to continue through the end of June - continue to work on diet control and exercise with a goal of 1-2 lb weight loss per week - try to avoid triggers and stressors by staying active. Triggers:being around other smokers, driving, sitting down, and family stress - Return for follow up June 25th @ 11:00

## 2011-07-01 NOTE — Progress Notes (Signed)
  Subjective:    Patient ID: Amanda Kim, female    DOB: 01/19/1946, 66 y.o.   MRN: [id number]  HPI  Patient arrives in great spirits and appears to have met goal of smoking < 10 cigs per day by mid May.    She reports stress with her daughter that has increased her smoking on certain days in the last week but she notes smoking as little as 4 per days in the last two weeks when she is busy and out of the house.   She denies smoking while away from her home.   Her son provided her Smoke Away and she is continuing to use the e-cigarette as well.   She continues to use her nicotine patch on busy days when she is working.    She is tolerating the higher dose (1mg  BID) of varenicline and desires to continue this higher dose at this time.   She plans to completely stop smoking on SaturdayJune 1st.    Review of Systems     Objective:   Physical Exam        Assessment & Plan:  moderate Nicotine Dependence of many years duration in a patient who is good candidate for success b/c of current level of motivation and well developed treatment plan.    Continue varenicline tx. Patient denies any adverse effects.  Patient will minimize use of patches and attempt to only use 1/2 patch as dose if she uses this in June.   She will consider using Smoke Away. Patient to only use e-cigs with zero mg of nicotine after June 1st.  Written information provided.  F/U Rx Clinic Visit at the end of June.    Total time in face-to-face counseling 40  minutes.  Patient seen with Melynda Ripple, PharmD. Candidate and Janace Litten, PharmD, Pharmacy Resident.

## 2011-07-01 NOTE — Progress Notes (Signed)
Patient ID: Amanda Kim, female   DOB: 04-Feb-1946, 66 y.o.   MRN: [id number] Reviewed and agree with Dr. Macky Lower management.

## 2011-07-01 NOTE — Assessment & Plan Note (Signed)
moderate Nicotine Dependence of many years duration in a patient who is good candidate for success b/c of current level of motivation and well developed treatment plan.    Continue varenicline tx. Patient denies any adverse effects.  Patient will minimize use of patches and attempt to only use 1/2 patch as dose if she uses this in June.   She will consider using Smoke Away. Patient to only use e-cigs with zero mg of nicotine after June 1st.  Written information provided.  F/U Rx Clinic Visit at the end of June.    Total time in face-to-face counseling 40  minutes.  Patient seen with Melynda Ripple, PharmD. Candidate and Janace Litten, PharmD, Pharmacy Resident.

## 2011-07-03 NOTE — Telephone Encounter (Signed)
Pt is going out of town tomorrow and needs a refill on this

## 2011-07-04 ENCOUNTER — Other Ambulatory Visit: Payer: Self-pay | Admitting: Family Medicine

## 2011-07-04 DIAGNOSIS — Z720 Tobacco use: Secondary | ICD-10-CM

## 2011-07-04 MED ORDER — VARENICLINE TARTRATE 1 MG PO TABS: 1.0000 mg | ORAL_TABLET | Freq: Two times a day (BID) | ORAL | Status: DC

## 2011-08-12 ENCOUNTER — Ambulatory Visit: Payer: Medicare Other | Admitting: Pharmacist

## 2011-08-25 ENCOUNTER — Encounter: Payer: Self-pay | Admitting: Pharmacist

## 2011-08-25 ENCOUNTER — Ambulatory Visit (INDEPENDENT_AMBULATORY_CARE_PROVIDER_SITE_OTHER): Payer: Medicare Other | Admitting: Pharmacist

## 2011-08-25 VITALS — BP 143/77 | HR 60 | Ht 64.0 in | Wt 157.3 lb

## 2011-08-25 DIAGNOSIS — F17200 Nicotine dependence, unspecified, uncomplicated: Secondary | ICD-10-CM | POA: Diagnosis not present

## 2011-08-25 DIAGNOSIS — Z720 Tobacco use: Secondary | ICD-10-CM

## 2011-08-25 NOTE — Progress Notes (Signed)
Patient ID: Amanda Kim, female   DOB: 31-Oct-1945, 66 y.o.   MRN: [id number] Reviewed and agree with Dr. Macky Lower documentation and management.

## 2011-08-25 NOTE — Assessment & Plan Note (Signed)
moderate Nicotine Dependence of many years duration in a patient who is excellent candidate for long term - success with recent quit of ~5 weeks. She has stopped varenicline. Encouraged continued abstinence and discussed major triggers for restart or relapse.  Written information provided.   F/U phone call in 4-6 weeks.   F/U Rx Clinic Visit as needed at the request of Dr. Konrad Dolores.   Total time in face-to-face counseling 35 minutes.

## 2011-08-25 NOTE — Patient Instructions (Addendum)
Congratulations on Quitting!  Keep up the great work on staying quit.   Next visit with Dr. Konrad Dolores.

## 2011-08-25 NOTE — Progress Notes (Signed)
  Subjective:    Patient ID: Amanda Kim, female    DOB: 09-02-1945, 66 y.o.   MRN: [id number]  HPI  Patient with long history of smoking AND previous quit of > 10 years arrives to visit in good mood.   She states she has quit smoking since May 30th 2013.   She is no longer using Chantix.   She used this for ~ 6 weeks total.   She has been going to a laser therapy pain treatment center in [facility] and has stopped gabapentin, flexeril and venlafaxine. She may restart these in the future.    Review of Systems     Objective:   Physical Exam        Assessment & Plan:  moderate Nicotine Dependence of many years duration in a patient who is excellent candidate for long term - success with recent quit of ~5 weeks. She has stopped varenicline. Encouraged continued abstinence and discussed major triggers for restart or relapse.  Written information provided.   F/U phone call in 4-6 weeks.   F/U Rx Clinic Visit as needed at the request of Dr. Konrad Dolores.   Total time in face-to-face counseling 35 minutes.

## 2011-09-05 ENCOUNTER — Encounter: Payer: Self-pay | Admitting: Family Medicine

## 2011-09-05 ENCOUNTER — Ambulatory Visit (INDEPENDENT_AMBULATORY_CARE_PROVIDER_SITE_OTHER): Payer: Medicare Other | Admitting: Family Medicine

## 2011-09-05 VITALS — BP 134/78 | HR 58 | Temp 98.5°F | Ht 64.0 in | Wt 160.0 lb

## 2011-09-05 DIAGNOSIS — G8929 Other chronic pain: Secondary | ICD-10-CM | POA: Diagnosis not present

## 2011-09-05 DIAGNOSIS — R635 Abnormal weight gain: Secondary | ICD-10-CM

## 2011-09-05 DIAGNOSIS — F17200 Nicotine dependence, unspecified, uncomplicated: Secondary | ICD-10-CM

## 2011-09-05 DIAGNOSIS — E785 Hyperlipidemia, unspecified: Secondary | ICD-10-CM | POA: Diagnosis not present

## 2011-09-05 DIAGNOSIS — R7309 Other abnormal glucose: Secondary | ICD-10-CM

## 2011-09-05 DIAGNOSIS — Z720 Tobacco use: Secondary | ICD-10-CM

## 2011-09-05 DIAGNOSIS — E039 Hypothyroidism, unspecified: Secondary | ICD-10-CM

## 2011-09-05 DIAGNOSIS — R739 Hyperglycemia, unspecified: Secondary | ICD-10-CM

## 2011-09-05 NOTE — Patient Instructions (Addendum)
Thank you for coming in today. Please come in to have your labwork drawn sometime next week. Please come fasting. Dr. Gerilyn Pilgrim will call you to set up a time to review nutrition and exercise.  I will let you know what your lab results are if they are abnormal Please let me know what dermatologist you would like to go see and I will fill out a referral.   Have a great day.

## 2011-09-09 NOTE — Assessment & Plan Note (Signed)
Fasting lipid panel ordered. Will followup.

## 2011-09-09 NOTE — Assessment & Plan Note (Signed)
May 155 pounds June 1 160 pounds. Likely secondary to decreased activity and unchanged caloric intake as patient has not exercised in 5 weeks. Patient very interested in formal nutrition counseling. Referral to inventory dietitian, Dr. Wyona Almas.

## 2011-09-09 NOTE — Progress Notes (Signed)
  Subjective:    Patient ID: Amanda Kim, female    DOB: 10-Mar-1945, 66 y.o.   MRN: [id number]  HPI Chief complaint: Weight gain  Weight gain: Patient reports smoking cessation as of 07/18/2011. Feels better overall, but is concerned about weight gain. Patient reports approximately 5 pound weight gain over this period of time. Patient has not been to the gym in 5 weeks. Patient has noticed an increase in her appetite as well as an increase in her ability to taste food which has made food more desirable since she stopped smoking. Denies decrease in energy level or fatigue. Interested in diet pills. Has never had any formal dietary dictation. Has tried extensive laser treatments outside facility for chronic pain complaints..  Hyperglycemia: Patient concerned for diabetes as previously noted to have elevated blood sugars on lab work. No family history of diabetes. Weight gain as noted above. Denies signs or symptoms associated with hypo-or height her glycemic events. No previous A1c.  Hyperlipidemia: Patient concerned about previously elevated lipids as noted in lab results from April 2013. Denies chest pain, shortness of breath, lightheadedness, syncope, palpitations. Family history reviewed and positive for paternal uncle with heart disease. Patient has been compliant with red yeast, fish oil, and coenzyme Q10 3 times a day since her last appointment. Tolerating these well. Denies previous use of statin.   Review of Systems Per history of present illness    Objective:   Physical Exam   General: No acute distress, pleasant, obese Cardiovascular: Regular rate and rhythm, no murmurs rubs or gallops     Assessment & Plan:

## 2011-09-09 NOTE — Assessment & Plan Note (Signed)
A1c and BMP

## 2011-09-09 NOTE — Assessment & Plan Note (Signed)
[place]ively 2 [place] Patient likely to stop going to these

## 2011-09-09 NOTE — Assessment & Plan Note (Signed)
Last thyroid level reviewed and normal. Likely not the cause of patient's weight gain.

## 2011-09-11 ENCOUNTER — Other Ambulatory Visit (INDEPENDENT_AMBULATORY_CARE_PROVIDER_SITE_OTHER): Payer: Medicare Other

## 2011-09-11 DIAGNOSIS — R7309 Other abnormal glucose: Secondary | ICD-10-CM | POA: Diagnosis not present

## 2011-09-11 DIAGNOSIS — E785 Hyperlipidemia, unspecified: Secondary | ICD-10-CM

## 2011-09-11 DIAGNOSIS — R739 Hyperglycemia, unspecified: Secondary | ICD-10-CM

## 2011-09-11 LAB — POCT GLYCOSYLATED HEMOGLOBIN (HGB A1C): Hemoglobin A1C: 5.9

## 2011-09-11 LAB — BASIC METABOLIC PANEL
CO2: 26 mEq/L (ref 19–32)
Calcium: 9.2 mg/dL (ref 8.4–10.5)
Creat: 0.7 mg/dL (ref 0.50–1.10)

## 2011-09-11 LAB — LIPID PANEL
HDL: 42 mg/dL (ref >39)
LDL Cholesterol: 105 mg/dL — ABNORMAL HIGH (ref 0–99)
Triglycerides: 138 mg/dL (ref <150)

## 2011-09-11 NOTE — Progress Notes (Signed)
Drew BMP, FLP, A1c BAJORDAN, MLS

## 2011-09-30 ENCOUNTER — Telehealth: Payer: Self-pay | Admitting: Family Medicine

## 2011-09-30 NOTE — Telephone Encounter (Signed)
Pt is asking for results of her labs from this time and last time.  Would like them sent to her. [address] [place] [phone]

## 2011-09-30 NOTE — Telephone Encounter (Signed)
Amanda Kim, Amanda Kim

## 2011-10-07 ENCOUNTER — Telehealth: Payer: Self-pay | Admitting: Pharmacist

## 2011-10-07 NOTE — Telephone Encounter (Signed)
Patient continues to remai smoke free.   she moved last week and this was stressful however, she did NOT smoke.   Encouraged continued abstinence.

## 2011-10-10 DIAGNOSIS — H02839 Dermatochalasis of unspecified eye, unspecified eyelid: Secondary | ICD-10-CM | POA: Diagnosis not present

## 2011-11-01 DIAGNOSIS — Z23 Encounter for immunization: Secondary | ICD-10-CM | POA: Diagnosis not present

## 2011-11-06 ENCOUNTER — Ambulatory Visit (INDEPENDENT_AMBULATORY_CARE_PROVIDER_SITE_OTHER): Payer: Medicare Other | Admitting: Family Medicine

## 2011-11-06 ENCOUNTER — Encounter: Payer: Self-pay | Admitting: Family Medicine

## 2011-11-06 VITALS — BP 126/73 | HR 74 | Temp 99.1°F | Ht 64.0 in | Wt 161.0 lb

## 2011-11-06 DIAGNOSIS — L570 Actinic keratosis: Secondary | ICD-10-CM

## 2011-11-06 DIAGNOSIS — M25579 Pain in unspecified ankle and joints of unspecified foot: Secondary | ICD-10-CM | POA: Insufficient documentation

## 2011-11-06 DIAGNOSIS — F17200 Nicotine dependence, unspecified, uncomplicated: Secondary | ICD-10-CM | POA: Diagnosis not present

## 2011-11-06 DIAGNOSIS — H16139 Photokeratitis, unspecified eye: Secondary | ICD-10-CM | POA: Insufficient documentation

## 2011-11-06 DIAGNOSIS — Z720 Tobacco use: Secondary | ICD-10-CM

## 2011-11-06 MED ORDER — MELOXICAM 15 MG PO TABS: 15.0000 mg | ORAL_TABLET | Freq: Every day | ORAL | Status: DC

## 2011-11-06 NOTE — Patient Instructions (Signed)
Thank you for coming in today Wrap your ankle as needed for support and wear shoes with lots of support I think your ankle should feel much better with rest and meloxicam Please come back to have your nose treated Have a great vacation

## 2011-11-11 ENCOUNTER — Encounter: Payer: Self-pay | Admitting: Family Medicine

## 2011-11-11 NOTE — Assessment & Plan Note (Signed)
AK of L nares. Plan cryodestruction after return from vacation

## 2011-11-11 NOTE — Assessment & Plan Note (Signed)
Using E cigarettes to stave off temptation to restart

## 2011-11-11 NOTE — Progress Notes (Signed)
  Subjective:    Patient ID: Amanda Kim, female    DOB: 09-02-1945, 66 y.o.   MRN: [id number]  HPI CC: Ankle pain. L nares bleeding  ANkle pain: started on Saturday suddenly Now constant ache. But improving. Denies any recent or pas trauma to ankle. Aleve 400mg  w/o much benefit. Improves w/ rest. No h/o arthritis. Denies any recent fever, rash, trauma as above  NOse bleed: Small growing scaly portion of L nares for last couple of years. Became sore last week and started to bleed. Denies any unintentional wt loss, fevers, night sweats. No h/o skin malignancy but signigficant sun exposure over lifetime  Past medical and social history reviwed and documented  Review of Systems Per hpi    Objective:   Physical Exam  Gen: NAD Musc: mild pain on deep palpation of maleous in ankle joint. No effusion. Non-erythematous SKin: SMall scaly patches on L lateral nares w/ scab formation and minimal erythema.       Assessment & Plan:

## 2011-12-01 ENCOUNTER — Encounter: Payer: Self-pay | Admitting: Family Medicine

## 2011-12-01 ENCOUNTER — Ambulatory Visit (INDEPENDENT_AMBULATORY_CARE_PROVIDER_SITE_OTHER): Payer: Medicare Other | Admitting: Family Medicine

## 2011-12-01 VITALS — BP 138/70 | HR 62 | Temp 98.9°F | Wt 164.0 lb

## 2011-12-01 DIAGNOSIS — M79622 Pain in left upper arm: Secondary | ICD-10-CM | POA: Insufficient documentation

## 2011-12-01 DIAGNOSIS — M79609 Pain in unspecified limb: Secondary | ICD-10-CM | POA: Diagnosis not present

## 2011-12-01 DIAGNOSIS — M25579 Pain in unspecified ankle and joints of unspecified foot: Secondary | ICD-10-CM | POA: Diagnosis not present

## 2011-12-01 MED ORDER — MELOXICAM 15 MG PO TABS: 15.0000 mg | ORAL_TABLET | Freq: Every day | ORAL | Status: DC

## 2011-12-01 MED ORDER — AZITHROMYCIN 250 MG PO TABS: 500.0000 mg | ORAL_TABLET | Freq: Every day | ORAL | Status: DC

## 2011-12-01 NOTE — Progress Notes (Signed)
  Subjective:    Patient ID: Amanda Kim, female    DOB: 04-15-1945, 66 y.o.   MRN: [id number]  HPI  1. left axillary pain. Patient noticed a "trigger point" in her left axillary area near the breast margin several days ago. She has trouble feeling a lump, but notes a sharp pain whenever she runs her fingers across the area. She also notes a smaller area on the right breast margin that correlates to a tiny scratch. She also complains of some mild left arm/finger edema. She has a new cat for the past few months that has been scratching her arm and bit her last week. She endorses some fatigue, chills at night, and is generally anxious regarding her health after she discovered that her friend was diagnosed with late stage cancer unexpectedly.   Does have history of small nodule on left mid-breast followed by [place] and mammograms yearly-no change on March testing, this area is separate from the area of interest today.   Review of Systems Denies any fever, cough, sore throat, night sweats, nipple discharge, neck lymph nodes, ulcerations, leg edema, abdominal pain, nausea.     Objective:   Physical Exam  Vitals reviewed. Constitutional: She is oriented to person, place, and time. She appears well-developed and well-nourished. No distress.  HENT:  Head: Normocephalic and atraumatic.  Nose: Nose normal.  Mouth/Throat: Oropharynx is clear and moist. No oropharyngeal exudate.  Eyes: EOM are normal. Pupils are equal, round, and reactive to light.  Neck: Normal range of motion. Neck supple. No thyromegaly present.  Cardiovascular: Normal rate, regular rhythm and normal heart sounds.   No murmur heard. Pulmonary/Chest: Effort normal and breath sounds normal. No respiratory distress. She has no wheezes. She has no rales.  Abdominal: Soft. Bowel sounds are normal. There is no tenderness.  Musculoskeletal: She exhibits no edema and no tenderness.       No evidence of edema, her ring fits tightly. No  joint tenderness or decreased ROM in hands. Has multiple shallow healing scratches on bilateral hands and forearms. No forearm or axillary LAD palpated.   She is TTP in one area of left axillae adjacent to breast tissue at 1-2 oclock position. No lumps palpated.   Tiny superficial ulceration on right breast margin/chest wall at 6 oclock.   No lumps/masses or skin changes on breasts. No nipple discharge. No cervical or supraclavicular LAD.    Lymphadenopathy:    She has no cervical adenopathy.  Neurological: She is alert and oriented to person, place, and time.  Skin: No rash noted. She is not diaphoretic.       Superficial scratches on bilateral UEs. Forearms.   Psychiatric: She has a normal mood and affect.       Assessment & Plan:

## 2011-12-01 NOTE — Patient Instructions (Addendum)
Nice to meet you. Its possible your pain is related to a lymph node, reaction from cat scratches. Try to use warm compresses three times a day. If not improving in 2 days, start antibiotic. Make appointment in 1-2 weeks with Dr. Konrad Dolores. If this does not go away, you would likely need another ultrasound or other study.  Cat Scratch Disease Cats often injure people by scratching or biting. This site of injury can become infected with a particular germ or bacteria present in the mouth of or on the cat. This germ is called Bartonella henselae. This infection is identified by the common name cat scratch disease (CSD).  SYMPTOMS  A red and sore pimple or bump, with or without pus, on the skin where the cat scratched or bit. The pimple or sore may be present for as long as three weeks after the scratch or bite occurred.  One or more enlarged lymph glands located toward the center of the body from where the injury occurred.  Less common symptoms include low-grade fever, tiredness, fatigue, headache and/or sore throat. DIAGNOSIS  The diagnosis is typically made by your caregiver who notes the history of a scratch or bite from a cat, and finds the skin sore and swollen lymph glands in the described area.  Culture of any drainage or pus from the injury site, or a needle aspiration or piece of tissue (biopsy) from a swollen lymph gland may also be done to confirm the diagnosis and assure that a different infection or disease is not causing your illness. Rare but serious complications may occur, they include:  Parinaud's syndrome - fever, swollen lymph glands and inflammation of the eye (conjunctivitis).  Infection of the brain (encephalitis).  Infection of the nerve of the eye (neuroretinitis).  Infection of the bone (osteomyelitis). TREATMENT  Usually treatment is not necessary or helpful, especially if you have a normal immune system. When infection is very severe, it may be treated with a  medicine that kills the bacteria (antibiotic).  People with immune system problems (such as having AIDS or an organ transplant, or being on steroids or other immune modifying drugs) should be treated with antibiotics. HOME CARE INSTRUCTIONS   Avoid injury while playing with cats.  Wash well after playing with cats.  Do not let your cat lick sores on your body.  Do not let your cat roam around outside of your house.  Keep the area of the cat scratch clean. Wash it with soap and water or apply an antiseptic solution such as povidone iodine.  You should get a tetanus shot if you have not had one in the past 5 or 10 years. If you receive one, your arm may get swollen and red and warm to the touch at the shot site. This is a common response to the medication in the shot. If you did not receive a tetanus shot here because you did not recall when your last one was given, make sure to check with your caregiver's office and determine if one is needed. Generally, for a "dirty" wound, you should receive a tetanus booster if you have not had one in the last five years. If you have a "clean" wound, you should receive a tetanus booster if you have not had one in the last ten years. SEEK IMMEDIATE MEDICAL CARE IF:   You have worsening signs of infection, such as more redness, increased pain, red streaking or pus coming from the wound, or warmth or swelling around the area  of the scratch.  You develop worsening swollen lymph glands.  You develop abdominal pain, have problems with your vision or develop a skin rash.  You have a fever.  You become more tired or dizzy, or have a worsening headache.  You develop inflammation of your eye or have increasing vision problems.  You have pain in one of your bones.  You develop a stiff neck.  You pass out. MAKE SURE YOU:   Understand these instructions.  Will watch your condition.  Will get help right away if you are not doing well or get  worse. Document Released: 02/01/2000 Document Revised: 04/28/2011 Document Reviewed: 03/15/2008 [facility] Patient Information 2013 [place], [place].

## 2011-12-01 NOTE — Assessment & Plan Note (Signed)
Differential includes cat scratch fever, lymphadenitis, neuropathic pain or less likely breast pathology/malignancy. We discussed trial of warm compresses and azithromycin course if not improving in next few days. She does have quite a few cat scratches and a good history for early cat scratch disease. If symptoms do not improve, would consider breast/chest wall ultrasound (does not seem to be in the breast tissue currently). Monitor for systemic signs, worsening symptoms and f/u in 2 weeks with PCP or sooner if needed.

## 2011-12-11 ENCOUNTER — Other Ambulatory Visit: Payer: Self-pay | Admitting: Family Medicine

## 2011-12-16 ENCOUNTER — Ambulatory Visit: Payer: Medicare Other | Admitting: Family Medicine

## 2011-12-18 DIAGNOSIS — R5381 Other malaise: Secondary | ICD-10-CM | POA: Diagnosis not present

## 2011-12-18 DIAGNOSIS — M1991 Primary osteoarthritis, unspecified site: Secondary | ICD-10-CM | POA: Diagnosis not present

## 2011-12-18 DIAGNOSIS — R5383 Other fatigue: Secondary | ICD-10-CM | POA: Diagnosis not present

## 2011-12-18 DIAGNOSIS — M549 Dorsalgia, unspecified: Secondary | ICD-10-CM | POA: Diagnosis not present

## 2011-12-19 DIAGNOSIS — R5383 Other fatigue: Secondary | ICD-10-CM | POA: Diagnosis not present

## 2011-12-19 DIAGNOSIS — M1991 Primary osteoarthritis, unspecified site: Secondary | ICD-10-CM | POA: Diagnosis not present

## 2011-12-25 DIAGNOSIS — R7309 Other abnormal glucose: Secondary | ICD-10-CM | POA: Diagnosis not present

## 2012-01-02 ENCOUNTER — Other Ambulatory Visit: Payer: Self-pay | Admitting: Family Medicine

## 2012-01-05 ENCOUNTER — Telehealth: Payer: Self-pay | Admitting: Family Medicine

## 2012-01-05 DIAGNOSIS — M25579 Pain in unspecified ankle and joints of unspecified foot: Secondary | ICD-10-CM

## 2012-01-05 NOTE — Telephone Encounter (Signed)
Spoke to pt Pt to continue mobic until next appt Will consider short course of narcotics for flares Will also treat skin lesion of nose at next appt Pt agreable w/ plan

## 2012-01-05 NOTE — Telephone Encounter (Signed)
Pt was told to call back in regards to the meds that Dr Konrad Dolores - pls call her

## 2012-01-05 NOTE — Telephone Encounter (Signed)
Patient is calling back because her pharmacist told her that Dr. Konrad Dolores wanted to speak to her.  She was hoping to get this resolved today.

## 2012-01-06 MED ORDER — MELOXICAM 15 MG PO TABS: 15.0000 mg | ORAL_TABLET | Freq: Every day | ORAL | Status: DC

## 2012-01-06 NOTE — Telephone Encounter (Signed)
Can I call this in, david?

## 2012-01-06 NOTE — Telephone Encounter (Signed)
Pt made appt for next week (11/26) to see Konrad Dolores - is asking if the Mobic has been called in?

## 2012-01-06 NOTE — Telephone Encounter (Signed)
Mobic reordered

## 2012-01-13 ENCOUNTER — Encounter: Payer: Self-pay | Admitting: Family Medicine

## 2012-01-13 ENCOUNTER — Ambulatory Visit (INDEPENDENT_AMBULATORY_CARE_PROVIDER_SITE_OTHER): Payer: Medicare Other | Admitting: Family Medicine

## 2012-01-13 VITALS — BP 134/82 | HR 64 | Ht 64.0 in | Wt 164.0 lb

## 2012-01-13 DIAGNOSIS — L570 Actinic keratosis: Secondary | ICD-10-CM

## 2012-01-13 DIAGNOSIS — F17200 Nicotine dependence, unspecified, uncomplicated: Secondary | ICD-10-CM

## 2012-01-13 DIAGNOSIS — R7309 Other abnormal glucose: Secondary | ICD-10-CM

## 2012-01-13 DIAGNOSIS — M549 Dorsalgia, unspecified: Secondary | ICD-10-CM | POA: Diagnosis not present

## 2012-01-13 DIAGNOSIS — Z720 Tobacco use: Secondary | ICD-10-CM

## 2012-01-13 DIAGNOSIS — M5412 Radiculopathy, cervical region: Secondary | ICD-10-CM | POA: Diagnosis not present

## 2012-01-13 DIAGNOSIS — M5410 Radiculopathy, site unspecified: Secondary | ICD-10-CM | POA: Insufficient documentation

## 2012-01-13 DIAGNOSIS — R739 Hyperglycemia, unspecified: Secondary | ICD-10-CM

## 2012-01-13 NOTE — Patient Instructions (Addendum)
Thank you for coming in today The physical therapist will call you to set up an appointment Please continue using the flexeril, meloxicam, and gabapentin as needed for your pain Use heat and massage to help with the muscle spasms The spots on your face will blister initially and then heal within 2 weeks Come back to see me as needed.  Good luck and have a great Thanksgiving

## 2012-01-13 NOTE — Assessment & Plan Note (Signed)
AK of L Nare x2 and R cheek frozen today w/ liquid nitrogen

## 2012-01-14 ENCOUNTER — Encounter: Payer: Self-pay | Admitting: Family Medicine

## 2012-01-14 NOTE — Progress Notes (Signed)
Amanda Kim is a 66 y.o. female who presents to [facility] today for Facial lesions and neck and back pain  Facial lesions: Previously evaluated for these. Significant h/o sun exposure and sundburns. Lesion at edge of nare that bleeds from time to time. Continues to increase in size. Lesion on cheek is new over the last several months.   Back pain: Chronic condition for pt since horse riding accident in 1987. Comes and goes. Pain will involve LE bilat from time to time w/ shooting pains down pesterior and lateral thighs. No loss of strength or sensation. Attacks have increased in freqyuency lately. Multiple per week. Worse w/ movement, especially after being on feet all day for work or after long plane flights. Stretching exercises at home helps. Takes Meloxicam and flexeril w/ good rlief.    Hand pain: Starts in neck and travels to hands some of the times. Other times just involves lower arms and hands. Numbness to a burning sensation. Often relieved w/ "shaking it out" when it wakes her up at night. No loss of tstrength or sensaiton in hands. Not taking Gabapentin    The following portions of the patient's history were reviewed and updated as appropriate: allergies, current medications, past medical history, family and social history, and problem list.  Patient is a nonsmoker   Past Medical History  Diagnosis Date  . Hypothyroid 05/07/2011    Dx in 2003 on synthroid 100    ROS as above otherwise neg.    Medications reviewed. Current Outpatient Prescriptions  Medication Sig Dispense Refill  . azithromycin (ZITHROMAX) 250 MG tablet Take 2 tablets (500 mg total) by mouth daily.  10 tablet  0  . CINNAMON PO Take 2 capsules by mouth.      . Coenzyme Q10-Red Yeast Rice 60-600 MG CAPS Take 3 capsules by mouth. Combination includes fish oil as well.       . cyclobenzaprine (FLEXERIL) 5 MG tablet Take 1 tablet (5 mg total) by mouth 3 (three) times daily as needed.  270 tablet  1  . Estradiol  (VAGIFEM) 10 MCG TABS Placed vaginally twice weekly. If continued dryness consider daily. Tier exception  90 tablet  6  . ferrous sulfate 325 (65 FE) MG EC tablet Take 325 mg by mouth daily with breakfast.      . gabapentin (NEURONTIN) 100 MG capsule Take 1 capsule (100 mg total) by mouth 3 (three) times daily.  90 capsule  3  . levothyroxine (SYNTHROID, LEVOTHROID) 100 MCG tablet Take 1 tablet (100 mcg total) by mouth daily. Tier exception, dispense as written, generic  90 tablet  3  . meloxicam (MOBIC) 15 MG tablet Take 1 tablet (15 mg total) by mouth daily.  30 tablet  0    Exam:  BP 134/82  Pulse 64  Ht 5\' 4"  (1.626 m)  Wt 164 lb (74.39 kg)  BMI 28.15 kg/m2 Gen: Well NAD HEENT: EOMI,  MMM Skin: 0.5cm round lesion of the skin fold of the L nares that is slightly raised and erythematous w/ adjoining lesion. R cheek lesion that is erythematous and scaly Musc: No bony abnormality of Lower back, arms. Spurlings test negative bilat despite mild pain on palpation of vervical spine. Normal ROM in Neck, upper, and lower extremities.  Exts: Non edematous BL  LE, warm and well perfused.   No results found for this or any previous visit (from the past 72 hour(s)).

## 2012-01-14 NOTE — Progress Notes (Signed)
Cryotherapy Procedure Note  Pre-operative Diagnosis: Actinic keratosis  Post-operative Diagnosis: Actinic keratosis and possible cancerous lesion  Locations: L Nare and R cheek  Indications: intermittent inflammation and bleeding w/ scaling, and changing nature  Anesthesia: not required   Procedure Details  History of allergy to iodine: no. Pacemaker? no.  Patient informed of risks (permanent scarring, infection, light or dark discoloration, bleeding, infection, weakness, numbness and recurrence of the lesion) and benefits of the procedure and verbal informed consent obtained.  The areas (3 lesions ) are treated with liquid nitrogen therapy, frozen. The patient tolerated procedure well.  The patient was instructed on post-op care, warned that there may be blister formation, redness and pain. Recommend OTC analgesia as needed for pain.  Condition: Stable  Complications: none.  Plan: 1. Instructed to keep the area dry and covered for 24-48h and clean thereafter. 2. Warning signs of infection were reviewed.   3. Recommended that the patient use her pain medications as needed for pain.  4. Return in as needed.

## 2012-01-14 NOTE — Assessment & Plan Note (Signed)
Continue to be tobacco free

## 2012-01-14 NOTE — Assessment & Plan Note (Signed)
Disc displacement along w/ sciatica.  Warning signs of critical nerve impingement reviewed Continue meloxicam and flexeril Will refer for PT Lumbar Xray

## 2012-01-14 NOTE — Assessment & Plan Note (Signed)
A1c from July 5.9 No further checking at this time

## 2012-01-14 NOTE — Assessment & Plan Note (Addendum)
Multifactorial Positional vs arthitic changes vs cervical radiculopathy Cspine film Continue meloxicam and flexeril

## 2012-01-14 NOTE — Addendum Note (Signed)
Addended by: Konrad Dolores, DAVID J on: 01/14/2012 01:46 PM   Modules accepted: Level of Service

## 2012-01-16 ENCOUNTER — Other Ambulatory Visit: Payer: Self-pay | Admitting: Family Medicine

## 2012-01-19 ENCOUNTER — Ambulatory Visit (HOSPITAL_COMMUNITY)
Admission: RE | Admit: 2012-01-19 | Discharge: 2012-01-19 | Disposition: A | Discharge status: Home / Self Care | Payer: Medicare Other | Source: Ambulatory Visit | Attending: Family Medicine | Admitting: Family Medicine

## 2012-01-19 DIAGNOSIS — M5410 Radiculopathy, site unspecified: Secondary | ICD-10-CM

## 2012-01-19 DIAGNOSIS — M47812 Spondylosis without myelopathy or radiculopathy, cervical region: Secondary | ICD-10-CM | POA: Diagnosis not present

## 2012-01-19 DIAGNOSIS — G8929 Other chronic pain: Secondary | ICD-10-CM | POA: Diagnosis not present

## 2012-01-19 DIAGNOSIS — M48061 Spinal stenosis, lumbar region without neurogenic claudication: Secondary | ICD-10-CM | POA: Diagnosis not present

## 2012-01-19 DIAGNOSIS — R209 Unspecified disturbances of skin sensation: Secondary | ICD-10-CM | POA: Diagnosis not present

## 2012-01-19 DIAGNOSIS — M5412 Radiculopathy, cervical region: Secondary | ICD-10-CM | POA: Insufficient documentation

## 2012-01-19 DIAGNOSIS — M549 Dorsalgia, unspecified: Secondary | ICD-10-CM | POA: Diagnosis not present

## 2012-01-19 DIAGNOSIS — M542 Cervicalgia: Secondary | ICD-10-CM | POA: Diagnosis not present

## 2012-01-20 ENCOUNTER — Ambulatory Visit: Payer: Medicare Other | Attending: Family Medicine | Admitting: Physical Therapy

## 2012-01-20 DIAGNOSIS — M2560 Stiffness of unspecified joint, not elsewhere classified: Secondary | ICD-10-CM | POA: Insufficient documentation

## 2012-01-20 DIAGNOSIS — IMO0001 Reserved for inherently not codable concepts without codable children: Secondary | ICD-10-CM | POA: Insufficient documentation

## 2012-01-20 DIAGNOSIS — M2550 Pain in unspecified joint: Secondary | ICD-10-CM | POA: Insufficient documentation

## 2012-01-22 ENCOUNTER — Ambulatory Visit: Payer: Medicare Other | Admitting: Physical Therapy

## 2012-01-27 ENCOUNTER — Ambulatory Visit: Payer: Medicare Other | Admitting: Physical Therapy

## 2012-01-27 ENCOUNTER — Encounter: Payer: Self-pay | Admitting: Family Medicine

## 2012-01-27 ENCOUNTER — Ambulatory Visit (INDEPENDENT_AMBULATORY_CARE_PROVIDER_SITE_OTHER): Payer: Medicare Other | Admitting: Family Medicine

## 2012-01-27 VITALS — BP 176/80 | HR 60 | Temp 98.8°F | Ht 64.0 in | Wt 166.0 lb

## 2012-01-27 DIAGNOSIS — E039 Hypothyroidism, unspecified: Secondary | ICD-10-CM | POA: Diagnosis not present

## 2012-01-27 DIAGNOSIS — L570 Actinic keratosis: Secondary | ICD-10-CM

## 2012-01-27 DIAGNOSIS — M5410 Radiculopathy, site unspecified: Secondary | ICD-10-CM

## 2012-01-27 DIAGNOSIS — R635 Abnormal weight gain: Secondary | ICD-10-CM | POA: Diagnosis not present

## 2012-01-27 DIAGNOSIS — M5412 Radiculopathy, cervical region: Secondary | ICD-10-CM

## 2012-01-27 DIAGNOSIS — G8929 Other chronic pain: Secondary | ICD-10-CM | POA: Diagnosis not present

## 2012-01-27 MED ORDER — CYCLOBENZAPRINE HCL 5 MG PO TABS: 5.0000 mg | ORAL_TABLET | Freq: Two times a day (BID) | ORAL | Status: DC | PRN

## 2012-01-27 MED ORDER — OXYCODONE-ACETAMINOPHEN 5-325 MG PO TABS: 1.0000 | ORAL_TABLET | ORAL | Status: DC | PRN

## 2012-01-27 NOTE — Patient Instructions (Addendum)
Thank you for coming in today Please fill your percocet adn use only as needed Please continue taking your other medications as prescribed Please meet with Dr. Gerilyn Pilgrim to discuss nutrition and exercise Please continue working with physical therapy Please have a great day and Christmas

## 2012-01-29 ENCOUNTER — Ambulatory Visit: Payer: Medicare Other | Admitting: Physical Therapy

## 2012-01-29 ENCOUNTER — Encounter: Payer: Self-pay | Admitting: Family Medicine

## 2012-01-29 NOTE — Assessment & Plan Note (Signed)
Reviewed imaging w/ pt and informed of diagnoses of spondylosis and DDD

## 2012-01-29 NOTE — Assessment & Plan Note (Signed)
Resolved, May need further treatmetn in future

## 2012-01-29 NOTE — Progress Notes (Signed)
Amanda Kim is a 66 y.o. female who presents to [facility] today for back pain, wt gain.  AK: healing well. No further skin irritation since cryotherapy. Mild bubbling of skin after treatment but has healed well per pt.   Back pain: Pt is helping. Tramadol not working. Pt has hd increased work demands lately and increased travel. Pt on feet throughout the day for training. Pain worsens w/ ambulation and from long travel. Denies any trauma. Denies loss of sensation or muscle strength. Reviewed xrays w/ pt. Taking the flexeril, neurontin, and meloxicam w/ benefit  Wt. Gain: 13lb wt gain since May. Same diet. Goes to gym 2x wkly. Only of aerobic. Tries to eat healthy. Denies palpitations, cold/heat intolerance, low energy, brittle hair. Taking levothyroxine  HTN: hypertensive today on recheck. Never previously on BP meds. Checks at work are in normal range. Denies CP, SOB, syncope, HA  The following portions of the patient's history were reviewed and updated as appropriate: allergies, current medications, past medical history, family and social history, and problem list.  Patient is a nonsmoker   Past Medical History  Diagnosis Date  . Hypothyroid 05/07/2011    Dx in 2003 on synthroid 100    ROS as above otherwise neg.    Medications reviewed. Current Outpatient Prescriptions  Medication Sig Dispense Refill  . azithromycin (ZITHROMAX) 250 MG tablet Take 2 tablets (500 mg total) by mouth daily.  10 tablet  0  . CINNAMON PO Take 2 capsules by mouth.      . Coenzyme Q10-Red Yeast Rice 60-600 MG CAPS Take 3 capsules by mouth. Combination includes fish oil as well.       . cyclobenzaprine (FLEXERIL) 5 MG tablet Take 1 tablet (5 mg total) by mouth 2 (two) times daily as needed for muscle spasms.  90 tablet  3  . Estradiol (VAGIFEM) 10 MCG TABS Placed vaginally twice weekly. If continued dryness consider daily. Tier exception  90 tablet  6  . ferrous sulfate 325 (65 FE) MG EC tablet Take 325 mg  by mouth daily with breakfast.      . gabapentin (NEURONTIN) 100 MG capsule Take 1 capsule (100 mg total) by mouth 3 (three) times daily.  90 capsule  3  . levothyroxine (SYNTHROID, LEVOTHROID) 100 MCG tablet Take 1 tablet (100 mcg total) by mouth daily. Tier exception, dispense as written, generic  90 tablet  3  . meloxicam (MOBIC) 15 MG tablet Take 1 tablet (15 mg total) by mouth daily.  30 tablet  0  . oxyCODONE-acetaminophen (PERCOCET/ROXICET) 5-325 MG per tablet Take 1 tablet by mouth every 4 (four) hours as needed for pain.  30 tablet  0    Exam:  BP 176/80  Pulse 60  Temp 98.8 F (37.1 C) (Oral)  Ht 5\' 4"  (1.626 m)  Wt 166 lb (75.297 kg)  BMI 28.49 kg/m2 Gen: Well NAD Psych: tearful today  No results found for this or any previous visit (from the past 72 hour(s)).

## 2012-01-29 NOTE — Assessment & Plan Note (Signed)
Last check in April was normal Compliant w/ therapy Recheck in April 2014

## 2012-01-29 NOTE — Assessment & Plan Note (Addendum)
MSK in nature. DDD and spondylosis of spine on film.  Pt compliant w/ other therapies but still in pain. Percocet x1 to get pt through current episode and through PT conitnue flexeril, meloxicam, and neurontin

## 2012-01-29 NOTE — Assessment & Plan Note (Signed)
Continued wt gain. Distressing to pt Will refer to Dr. Gerilyn Pilgrim for coaching

## 2012-02-02 ENCOUNTER — Ambulatory Visit: Payer: Medicare Other | Admitting: Physical Therapy

## 2012-02-09 ENCOUNTER — Encounter: Payer: Self-pay | Admitting: Family Medicine

## 2012-02-09 ENCOUNTER — Ambulatory Visit: Payer: Medicare Other | Admitting: Physical Therapy

## 2012-02-10 ENCOUNTER — Encounter: Payer: Self-pay | Admitting: Family Medicine

## 2012-02-12 ENCOUNTER — Telehealth: Payer: Self-pay | Admitting: *Deleted

## 2012-02-12 NOTE — Telephone Encounter (Signed)
Route to MD

## 2012-02-13 ENCOUNTER — Encounter: Payer: Medicare Other | Admitting: Physical Therapy

## 2012-02-17 ENCOUNTER — Ambulatory Visit: Payer: Medicare Other | Admitting: Physical Therapy

## 2012-02-19 ENCOUNTER — Ambulatory Visit: Payer: Medicare Other | Attending: Family Medicine | Admitting: Physical Therapy

## 2012-02-19 ENCOUNTER — Other Ambulatory Visit: Payer: Self-pay | Admitting: *Deleted

## 2012-02-19 ENCOUNTER — Encounter: Payer: Self-pay | Admitting: Family Medicine

## 2012-02-19 DIAGNOSIS — IMO0001 Reserved for inherently not codable concepts without codable children: Secondary | ICD-10-CM | POA: Diagnosis not present

## 2012-02-19 DIAGNOSIS — M2560 Stiffness of unspecified joint, not elsewhere classified: Secondary | ICD-10-CM | POA: Insufficient documentation

## 2012-02-19 DIAGNOSIS — M2550 Pain in unspecified joint: Secondary | ICD-10-CM | POA: Diagnosis not present

## 2012-02-19 DIAGNOSIS — M25579 Pain in unspecified ankle and joints of unspecified foot: Secondary | ICD-10-CM

## 2012-02-19 MED ORDER — MELOXICAM 15 MG PO TABS: 15.0000 mg | ORAL_TABLET | Freq: Every day | ORAL | Status: DC

## 2012-02-20 ENCOUNTER — Telehealth: Payer: Self-pay | Admitting: Family Medicine

## 2012-02-20 DIAGNOSIS — M25579 Pain in unspecified ankle and joints of unspecified foot: Secondary | ICD-10-CM

## 2012-02-20 MED ORDER — MELOXICAM 15 MG PO TABS: 15.0000 mg | ORAL_TABLET | Freq: Every day | ORAL | Status: DC

## 2012-02-20 NOTE — Telephone Encounter (Signed)
Pt is now out and needs this called into [facility] pharmacy

## 2012-02-20 NOTE — Telephone Encounter (Signed)
Sending Rx to [facility] for mobic 15mg  daily 30 tablets no refills.

## 2012-02-20 NOTE — Telephone Encounter (Signed)
Med was refilled to [facility].  Will route request to Dr. Gwenlyn Saran for approval to fill at [facility] pharmacy.  Gaylene Brooks, RN

## 2012-02-22 ENCOUNTER — Encounter: Payer: Self-pay | Admitting: Family Medicine

## 2012-02-24 ENCOUNTER — Encounter: Payer: Self-pay | Admitting: Family Medicine

## 2012-03-02 ENCOUNTER — Other Ambulatory Visit: Payer: Self-pay | Admitting: Family Medicine

## 2012-03-02 DIAGNOSIS — L719 Rosacea, unspecified: Secondary | ICD-10-CM | POA: Diagnosis not present

## 2012-03-02 DIAGNOSIS — D485 Neoplasm of uncertain behavior of skin: Secondary | ICD-10-CM | POA: Diagnosis not present

## 2012-03-02 DIAGNOSIS — L219 Seborrheic dermatitis, unspecified: Secondary | ICD-10-CM | POA: Diagnosis not present

## 2012-03-02 DIAGNOSIS — D239 Other benign neoplasm of skin, unspecified: Secondary | ICD-10-CM | POA: Diagnosis not present

## 2012-03-02 DIAGNOSIS — L821 Other seborrheic keratosis: Secondary | ICD-10-CM | POA: Diagnosis not present

## 2012-03-04 ENCOUNTER — Other Ambulatory Visit: Payer: Self-pay | Admitting: Family Medicine

## 2012-03-04 DIAGNOSIS — M25579 Pain in unspecified ankle and joints of unspecified foot: Secondary | ICD-10-CM

## 2012-03-04 NOTE — Telephone Encounter (Signed)
Spoke with patient . Pharmacy dose not have gabapentin RX she states.  I called pharmacy and was told they do have RX but patient has to call them to release it and give OK for delivery. Patient notified.   Also she wants a new  RX sent for meloxicam for #90 day supply. Will forward to Dr. Konrad Dolores.

## 2012-03-04 NOTE — Telephone Encounter (Signed)
Patient is calling because the refill request for her Gabapentin was sent in on 1/14 and the pharmacy still hasn't heard back from Dr. Konrad Dolores.  She is out of her meds and needs the refill as soon as possible.

## 2012-03-10 MED ORDER — MELOXICAM 15 MG PO TABS: 15.0000 mg | ORAL_TABLET | Freq: Every day | ORAL | Status: DC

## 2012-03-10 NOTE — Addendum Note (Signed)
Addended by: Konrad Dolores,  J on: 03/10/2012 04:50 PM   Modules accepted: Orders

## 2012-03-15 ENCOUNTER — Ambulatory Visit: Payer: Medicare Other | Admitting: Family Medicine

## 2012-03-18 DIAGNOSIS — R112 Nausea with vomiting, unspecified: Secondary | ICD-10-CM | POA: Diagnosis not present

## 2012-03-18 DIAGNOSIS — K2990 Gastroduodenitis, unspecified, without bleeding: Secondary | ICD-10-CM | POA: Diagnosis not present

## 2012-03-18 DIAGNOSIS — K2970 Gastritis, unspecified, without bleeding: Secondary | ICD-10-CM | POA: Diagnosis not present

## 2012-03-18 DIAGNOSIS — R109 Unspecified abdominal pain: Secondary | ICD-10-CM | POA: Diagnosis not present

## 2012-03-18 DIAGNOSIS — R197 Diarrhea, unspecified: Secondary | ICD-10-CM | POA: Diagnosis not present

## 2012-03-23 ENCOUNTER — Other Ambulatory Visit: Payer: Self-pay | Admitting: Family Medicine

## 2012-03-23 DIAGNOSIS — N63 Unspecified lump in unspecified breast: Secondary | ICD-10-CM

## 2012-03-25 ENCOUNTER — Other Ambulatory Visit: Payer: Self-pay | Admitting: Family Medicine

## 2012-03-25 NOTE — Telephone Encounter (Signed)
Patient is calling for a refill on Meloxicam, she does not want it sent to [facility], but to [facility] instead.  She only has 1 left so she needs this done as soon as possible.

## 2012-03-26 NOTE — Telephone Encounter (Signed)
Spoke to pt by phione Intermittent stomach pain recently. Discussed risks benefits of prolonged Mobic use adn would like to DC Pt in agreeance.  Pain is improving w/ PT.

## 2012-03-31 ENCOUNTER — Encounter: Payer: Self-pay | Admitting: Family Medicine

## 2012-03-31 ENCOUNTER — Other Ambulatory Visit: Payer: Self-pay | Admitting: Internal Medicine

## 2012-03-31 ENCOUNTER — Ambulatory Visit
Admission: RE | Admit: 2012-03-31 | Discharge: 2012-03-31 | Disposition: A | Discharge status: Home / Self Care | Payer: Medicare Other | Source: Ambulatory Visit | Attending: Internal Medicine | Admitting: Internal Medicine

## 2012-03-31 DIAGNOSIS — R7989 Other specified abnormal findings of blood chemistry: Secondary | ICD-10-CM | POA: Diagnosis not present

## 2012-03-31 DIAGNOSIS — K8020 Calculus of gallbladder without cholecystitis without obstruction: Secondary | ICD-10-CM | POA: Diagnosis not present

## 2012-03-31 DIAGNOSIS — K7689 Other specified diseases of liver: Secondary | ICD-10-CM | POA: Diagnosis not present

## 2012-03-31 DIAGNOSIS — R945 Abnormal results of liver function studies: Secondary | ICD-10-CM

## 2012-03-31 DIAGNOSIS — K759 Inflammatory liver disease, unspecified: Secondary | ICD-10-CM | POA: Diagnosis not present

## 2012-04-06 DIAGNOSIS — K8020 Calculus of gallbladder without cholecystitis without obstruction: Secondary | ICD-10-CM | POA: Diagnosis not present

## 2012-04-06 DIAGNOSIS — M549 Dorsalgia, unspecified: Secondary | ICD-10-CM | POA: Diagnosis not present

## 2012-04-06 DIAGNOSIS — K7689 Other specified diseases of liver: Secondary | ICD-10-CM | POA: Diagnosis not present

## 2012-04-13 ENCOUNTER — Ambulatory Visit (INDEPENDENT_AMBULATORY_CARE_PROVIDER_SITE_OTHER): Payer: Self-pay | Admitting: Family Medicine

## 2012-04-13 ENCOUNTER — Encounter (INDEPENDENT_AMBULATORY_CARE_PROVIDER_SITE_OTHER): Payer: Self-pay | Admitting: General Surgery

## 2012-04-13 ENCOUNTER — Ambulatory Visit (INDEPENDENT_AMBULATORY_CARE_PROVIDER_SITE_OTHER): Payer: Medicare Other | Admitting: General Surgery

## 2012-04-13 VITALS — BP 140/84 | HR 64 | Temp 97.7°F | Resp 20 | Ht 64.0 in | Wt 166.0 lb

## 2012-04-13 DIAGNOSIS — K8020 Calculus of gallbladder without cholecystitis without obstruction: Secondary | ICD-10-CM | POA: Insufficient documentation

## 2012-04-13 DIAGNOSIS — Z711 Person with feared health complaint in whom no diagnosis is made: Secondary | ICD-10-CM

## 2012-04-13 NOTE — Progress Notes (Signed)
Subjective:     Patient ID: Amanda Kim, female   DOB: 11/14/1945, 67 y.o.   MRN: [id number]  HPI We're asked to see the patient in consultation by Dr. Konrad Dolores to evaluate her for gallstones. The patient is a 67 year old white female who was recently at a conference in [place] when she developed some epigastric pain. The pain gradually resolved. A couple weeks ago she then developed pain again at her boyfriend. The pain would come and go. She denies any nausea or vomiting. She went to see her medical doctor and was found to have elevated liver functions. On subsequent blood draws her liver functions return to normal. She also had an ultrasound that showed stones in the gallbladder but no gallbladder wall thickening or ductal dilatation  Review of Systems  Constitutional: Negative.   HENT: Negative.   Eyes: Negative.   Respiratory: Negative.   Cardiovascular: Negative.   Gastrointestinal: Positive for abdominal pain.  Endocrine: Negative.   Genitourinary: Negative.   Musculoskeletal: Negative.   Skin: Negative.   Allergic/Immunologic: Negative.   Neurological: Negative.   Hematological: Negative.   Psychiatric/Behavioral: Negative.        Objective:   Physical Exam  Constitutional: She is oriented to person, place, and time. She appears well-developed and well-nourished.  HENT:  Head: Normocephalic and atraumatic.  Eyes: Conjunctivae and EOM are normal. Pupils are equal, round, and reactive to light.  Neck: Normal range of motion. Neck supple.  Cardiovascular: Normal rate, regular rhythm and normal heart sounds.   Pulmonary/Chest: Effort normal and breath sounds normal.  Abdominal: Soft. Bowel sounds are normal. There is tenderness.  Musculoskeletal: Normal range of motion.  Neurological: She is alert and oriented to person, place, and time.  Skin: Skin is warm and dry.  Psychiatric: She has a normal mood and affect. Her behavior is normal.       Assessment:     The  patient has what appears to be symptomatic gallstones. Because of the risk of further painful episodes and possible pancreatitis I think she would benefit from having her gallbladder removed. She would also like to have this done. I discussed with her in detail the risks and benefits of the operation to remove the gallbladder as well as some of the technical aspects and she understands and wishes to proceed     Plan:     Plan for laparoscopic cholecystectomy with intraoperative cholangiogram

## 2012-04-13 NOTE — Patient Instructions (Signed)
Plan for laparoscopic cholecystectomy with intraoperative cholangiogram 

## 2012-04-16 ENCOUNTER — Other Ambulatory Visit: Payer: Self-pay | Admitting: Family Medicine

## 2012-04-20 ENCOUNTER — Telehealth: Payer: Self-pay | Admitting: *Deleted

## 2012-04-20 NOTE — Telephone Encounter (Addendum)
PA required for Flexeril. Form placed in MD box.

## 2012-04-20 NOTE — Telephone Encounter (Signed)
Form faxed to insurance company. 

## 2012-05-03 ENCOUNTER — Ambulatory Visit
Admission: RE | Admit: 2012-05-03 | Discharge: 2012-05-03 | Disposition: A | Discharge status: Home / Self Care | Payer: Medicare Other | Source: Ambulatory Visit | Attending: Family Medicine | Admitting: Family Medicine

## 2012-05-03 DIAGNOSIS — R928 Other abnormal and inconclusive findings on diagnostic imaging of breast: Secondary | ICD-10-CM | POA: Diagnosis not present

## 2012-05-04 ENCOUNTER — Ambulatory Visit: Payer: Self-pay | Admitting: Family Medicine

## 2012-05-06 ENCOUNTER — Encounter: Payer: Self-pay | Admitting: Family Medicine

## 2012-05-06 ENCOUNTER — Ambulatory Visit (INDEPENDENT_AMBULATORY_CARE_PROVIDER_SITE_OTHER): Payer: Medicare Other | Admitting: Family Medicine

## 2012-05-06 VITALS — BP 144/79 | HR 71 | Temp 99.0°F | Ht 64.0 in | Wt 166.0 lb

## 2012-05-06 DIAGNOSIS — M549 Dorsalgia, unspecified: Secondary | ICD-10-CM | POA: Diagnosis not present

## 2012-05-06 DIAGNOSIS — K8020 Calculus of gallbladder without cholecystitis without obstruction: Secondary | ICD-10-CM

## 2012-05-06 DIAGNOSIS — I10 Essential (primary) hypertension: Secondary | ICD-10-CM

## 2012-05-06 DIAGNOSIS — R635 Abnormal weight gain: Secondary | ICD-10-CM

## 2012-05-06 DIAGNOSIS — G8929 Other chronic pain: Secondary | ICD-10-CM | POA: Diagnosis not present

## 2012-05-06 MED ORDER — OXYCODONE-ACETAMINOPHEN 5-325 MG PO TABS: 1.0000 | ORAL_TABLET | ORAL | Status: DC | PRN

## 2012-05-06 MED ORDER — PROPRANOLOL HCL 10 MG PO TABS: 10.0000 mg | ORAL_TABLET | Freq: Two times a day (BID) | ORAL | Status: DC

## 2012-05-06 NOTE — Assessment & Plan Note (Addendum)
Pt w/ HTN in office and at home.   Underwent long discussion regarding various therapies and pt would like to try propranolol given anxiety component (did not tolerate antidepressants in the past)  Rx sent for propranolol If symptomatic brady or hypotensive. Will DC and change to ACEi.

## 2012-05-06 NOTE — Assessment & Plan Note (Deleted)
Multifactorial. Leg length discrepancy greater than 1cm (L<R), muscle spasm, DDD. PT w/o benefit Percocet x30 ONCE. Pt aware that this will not be refilled by this clinic Pt previously tried amitriptyline and currently on flexeril, ibuprofen, and neurontin w/ some benefit SM referral.

## 2012-05-06 NOTE — Progress Notes (Signed)
Amanda Kim is a 67 y.o. female who presents to [facility] today for back pain.   Gallstones: Scheduled for surgery on 06/06/12. Eveluated by Internal medicine and surgery.  Back pain: taking gabapentin, flexeril and 800mg  ibuprofen w/ some benefit. On feet all day. Physical therapy w/o benefit.   BP at home: typically greater than 150. Very stressed lately. Difficulty w/ relationships, no work, waking up at night anxious.   Wt: meeting w/ Dr. Gerilyn Pilgrim in nutrition class.   The following portions of the patient's history were reviewed and updated as appropriate: allergies, current medications, past medical history, family and social history, and problem list.  Patient is a nonsmoker  Past Medical History  Diagnosis Date  . Hypothyroid 05/07/2011    Dx in 2003 on synthroid 100  . Spondylosis of thoracolumbar region without myelopathy or radiculopathy   . Hyperlipidemia     ROS as above otherwise neg.    Medications reviewed. Current Outpatient Prescriptions  Medication Sig Dispense Refill  . CINNAMON PO Take 2 capsules by mouth.      . Coenzyme Q10-Red Yeast Rice 60-600 MG CAPS Take 3 capsules by mouth. Combination includes fish oil as well.       . cyclobenzaprine (FLEXERIL) 5 MG tablet Take 1 tablet (5 mg total) by mouth 2 (two) times daily as needed for muscle spasms.  90 tablet  3  . cyclobenzaprine (FLEXERIL) 5 MG tablet TAKE 1 TABLET 3 TIMES A DAYAS NEEDED  90 tablet  1  . Estradiol (VAGIFEM) 10 MCG TABS Placed vaginally twice weekly. If continued dryness consider daily. Tier exception  90 tablet  6  . ferrous sulfate 325 (65 FE) MG EC tablet Take 325 mg by mouth daily with breakfast.      . fish oil-omega-3 fatty acids 1000 MG capsule Take 2 g by mouth daily.      Marland Kitchen gabapentin (NEURONTIN) 100 MG capsule TAKE 1 CAPSULE 3 TIMES A   DAY  90 capsule  3  . levothyroxine (SYNTHROID, LEVOTHROID) 100 MCG tablet Take 1 tablet (100 mcg total) by mouth daily. Tier exception, dispense as  written, generic  90 tablet  3  . oxyCODONE-acetaminophen (PERCOCET/ROXICET) 5-325 MG per tablet Take 1 tablet by mouth every 4 (four) hours as needed for pain.  30 tablet  0  . propranolol (INDERAL) 10 MG tablet Take 1 tablet (10 mg total) by mouth 2 (two) times daily.  60 tablet  3  . vitamin E 600 UNIT capsule Take 2 Units by mouth.       No current facility-administered medications for this visit.    Exam:  BP 144/79  Pulse 71  Temp(Src) 99 F (37.2 C) (Oral)  Ht 5\' 4"  (1.626 m)  Wt 75.297 kg (166 lb)  BMI 28.48 kg/m2 Gen: Well NAD HEENT: EOMI,  MMM MSK: perispinal muscle tenderness and tightness on palpation in the lumbar region. No sacral point tenderness. Leg length discrepancy w/ LLL >1cm shorter than RLL. FROM of LE bilat. FABER's, straight leg raise nml bilat.   Exts: Non edematous BL  LE, warm and well perfused.   No results found for this or any previous visit (from the past 72 hour(s)).

## 2012-05-06 NOTE — Patient Instructions (Signed)
You are doing well Your weight is stable.  Please continue working with Dr. Gerilyn Pilgrim Please go see the physicians at sports medicine Please continue with your physical therapy if you feel like you will benefit Please continue taking flexeril, neurontin, and ibuprofen as needed for your back Please start taking propranolol for your blood pressure and anxiousness.  Life will get better If you are still feeling depressed or anxious in a few months come to see me to discuss starting a medicine.

## 2012-05-10 ENCOUNTER — Encounter: Payer: Self-pay | Admitting: Family Medicine

## 2012-05-10 NOTE — Assessment & Plan Note (Signed)
Wt is stable. Continue working with Dr. Gerilyn Pilgrim on nutrition and exercise plans.

## 2012-05-10 NOTE — Assessment & Plan Note (Signed)
Likely multifactorial. DDD on plain film w/ muscle spasm, biomechanical component secondary to unequal leg length.  Pt w/ little benefit from formal PT, flexeril, ibuprofen, and gabapentin. Has tried amitriptyline in the past but did not tolerate.  Will refer to [facility] for formal evaluation, possible OM therapy (as has benefitted from this int he past), and likely orthotics.

## 2012-05-10 NOTE — Assessment & Plan Note (Signed)
OK for elective cholecystectomy. Planned for 06/06/12

## 2012-05-12 ENCOUNTER — Encounter: Payer: Self-pay | Admitting: Sports Medicine

## 2012-05-12 ENCOUNTER — Telehealth: Payer: Self-pay | Admitting: Family Medicine

## 2012-05-12 ENCOUNTER — Ambulatory Visit (INDEPENDENT_AMBULATORY_CARE_PROVIDER_SITE_OTHER): Payer: Medicare Other | Admitting: Sports Medicine

## 2012-05-12 VITALS — BP 144/84 | HR 73 | Ht 64.0 in | Wt 166.0 lb

## 2012-05-12 DIAGNOSIS — I10 Essential (primary) hypertension: Secondary | ICD-10-CM

## 2012-05-12 DIAGNOSIS — M545 Low back pain, unspecified: Secondary | ICD-10-CM | POA: Diagnosis not present

## 2012-05-12 DIAGNOSIS — Q762 Congenital spondylolisthesis: Secondary | ICD-10-CM | POA: Diagnosis not present

## 2012-05-12 DIAGNOSIS — M4310 Spondylolisthesis, site unspecified: Secondary | ICD-10-CM

## 2012-05-12 DIAGNOSIS — M2170 Unequal limb length (acquired), unspecified site: Secondary | ICD-10-CM | POA: Diagnosis not present

## 2012-05-12 MED ORDER — PROPRANOLOL HCL 10 MG PO TABS: 10.0000 mg | ORAL_TABLET | Freq: Two times a day (BID) | ORAL | Status: DC

## 2012-05-12 NOTE — Patient Instructions (Addendum)
DR Marzetta Merino 3.27.14 845PM [facility] [address] [phone]

## 2012-05-12 NOTE — Telephone Encounter (Signed)
Patient has misplaced her medication and  is wanting to know if you can call in a rx to [facility] for her blood pressure medication.

## 2012-05-12 NOTE — Progress Notes (Signed)
Subjective: Amanda Kim is a 67-y.o. new patient who presents to clinic c/o low back pain.  Her pain has been consistent since an injury in 1987 in which she was thrown off of a horse and landed on her back with her camera underneath her.  At that time, she was told she had fracture one of her vertebrae.  Since that time, she has tried numerous treatments to attempt to alleviate her pain [massage, facet joint injections, yoga/exercise, laser therapy, acupuncture], all giving her minimal or short-term relief.  Her pain is mainly localized to her right lower back/buttock and right hip.  It is constant and varies in severity depending upon activity.  She notes some radiation of pain down to the top of her right foot at times.  She currently does yoga and goes to the gym 2 to 3 times per week.  Works in [employer] and does a lot of sitting and standing, which both seem to equally exacerbate her symptoms.  She notes that she especially feels the pain when she attempts to bend forward at the waist.  Denies paresthesias or weakness. Of all of her previous treatment she has noticed the most benefit from facet injections. She would be interested in considering these again.  PMHx is significant for chronic pain, HTN, hypothyroidism, hyperlipidemia, and gallstones.  She is scheduled to have her gallbladder removed on 06/06/2012.  Current medications include Synthroid, Gabapentin, Flexeril, Ibuprofen, and vitamins.  She is allergic to penicillins.  Review of Systems: Negative except as noted above.  Objective: GENERAL - Well-developed, well-nourished.  NAD. BACK - No apparent deformity on inspection.  No palpable "step off" of spine.  Normal spine curvature.  Mild TTP over right lower back/buttock and right hip.  Full range of motion with some discomfort on flexion at the waist. Seated and supine SLR produce mild "pulling" sensation in lower back. NEURO - Upper and lower extremity muscle strength equal.  DTRs  intact B/L.  No focal neuro deficits.  Normal muscle tone.  Sensation intact. GAIT - Normal.  No limp.  Mild difference in limb length with right leg slightly shorter than left (approx. 1/2 cm). This is present both supine and with sitting.  LUMBAR SPINE RADIOGRAPH - Complete 4+ view of lumbar spine from 01/13/2012 was reviewed today.  There is prominent anterior T10-T11 disc space narrowing with surrounding sclerosis and anterior osteophyte.  There are bilateral L5 pars defects with 3 mm anterior slip of L5.  L2-3 and L3-4 mild disc space narrowing and osteophyte formation.  L4-5 show very mild disc space narrowing.  Assessment: 1. Low back pain likely secondary to grade 1 spondylolisthesis of L5-S1 and bilateral L5 pars defects.  This may be exacerbated by her mild limb length discrepancy.  Plan: We discussed the results of the lumbar spine radiographs with the patient today.  She was given green sport insoles with a 3/16-inch heel lift in the right shoe.  Since she has gotten pain relief from facet injections in the past, she was also given a referral to Dr. Chong Sicilian for evaluation and possible facet injection.  She will follow-up with our office as needed.  Dictated by Lonzo Candy, MS4.

## 2012-05-13 DIAGNOSIS — M4310 Spondylolisthesis, site unspecified: Secondary | ICD-10-CM | POA: Diagnosis not present

## 2012-05-13 DIAGNOSIS — IMO0002 Reserved for concepts with insufficient information to code with codable children: Secondary | ICD-10-CM | POA: Diagnosis not present

## 2012-05-13 DIAGNOSIS — M76899 Other specified enthesopathies of unspecified lower limb, excluding foot: Secondary | ICD-10-CM | POA: Diagnosis not present

## 2012-05-14 ENCOUNTER — Encounter (HOSPITAL_COMMUNITY): Payer: Self-pay | Admitting: Pharmacy Technician

## 2012-05-14 ENCOUNTER — Telehealth: Payer: Self-pay | Admitting: Family Medicine

## 2012-05-14 ENCOUNTER — Encounter: Payer: Self-pay | Admitting: Family Medicine

## 2012-05-14 ENCOUNTER — Ambulatory Visit (INDEPENDENT_AMBULATORY_CARE_PROVIDER_SITE_OTHER): Payer: Medicare Other | Admitting: Family Medicine

## 2012-05-14 VITALS — BP 149/83 | HR 82 | Temp 99.2°F | Ht 64.0 in | Wt 163.0 lb

## 2012-05-14 DIAGNOSIS — R109 Unspecified abdominal pain: Secondary | ICD-10-CM

## 2012-05-14 DIAGNOSIS — R112 Nausea with vomiting, unspecified: Secondary | ICD-10-CM | POA: Insufficient documentation

## 2012-05-14 LAB — COMPREHENSIVE METABOLIC PANEL
Albumin: 4.4 g/dL (ref 3.5–5.2)
CO2: 29 mEq/L (ref 19–32)
Chloride: 102 mEq/L (ref 96–112)
Glucose, Bld: 135 mg/dL — ABNORMAL HIGH (ref 70–99)
Potassium: 4.1 mEq/L (ref 3.5–5.3)
Sodium: 139 mEq/L (ref 135–145)
Total Protein: 6.8 g/dL (ref 6.0–8.3)

## 2012-05-14 LAB — CBC WITH DIFFERENTIAL/PLATELET
Hemoglobin: 15 g/dL (ref 12.0–15.0)
Lymphocytes Relative: 5 % — ABNORMAL LOW (ref 12–46)
Lymphs Abs: 0.6 10*3/uL — ABNORMAL LOW (ref 0.7–4.0)
MCH: 29.2 pg (ref 26.0–34.0)
Monocytes Relative: 3 % (ref 3–12)
Neutro Abs: 11.4 10*3/uL — ABNORMAL HIGH (ref 1.7–7.7)
Neutrophils Relative %: 91 % — ABNORMAL HIGH (ref 43–77)
RBC: 5.13 MIL/uL — ABNORMAL HIGH (ref 3.87–5.11)

## 2012-05-14 LAB — LIPASE: Lipase: 13 U/L (ref 0–75)

## 2012-05-14 MED ORDER — ONDANSETRON HCL 4 MG PO TABS: 4.0000 mg | ORAL_TABLET | Freq: Three times a day (TID) | ORAL | Status: DC | PRN

## 2012-05-14 MED ORDER — PROMETHAZINE HCL 25 MG/ML IJ SOLN
12.5000 mg | Freq: Once | INTRAMUSCULAR | Status: AC
  Administered 2012-05-14: 12.5 mg via INTRAMUSCULAR

## 2012-05-14 MED ORDER — PROMETHAZINE HCL 12.5 MG PO TABS: 12.5000 mg | ORAL_TABLET | Freq: Three times a day (TID) | ORAL | Status: DC | PRN

## 2012-05-14 MED ORDER — PROMETHAZINE HCL 12.5 MG RE SUPP: 12.5000 mg | Freq: Four times a day (QID) | RECTAL | Status: DC | PRN

## 2012-05-14 NOTE — Telephone Encounter (Signed)
Returned call to patient.  Vomiting as we were talking on phone.  Patient's surgery has been rescheduled from next week.  Has been vomiting "all day" and unable to keep anything down.  States, "I'm sure it's my gallbladder."  Work-in appt scheduled for this afternoon.  Patient will find someone to bring her here and will try to be here no later than 4:00 pm.  Gaylene Brooks, RN

## 2012-05-14 NOTE — Progress Notes (Signed)
  Subjective:    Patient ID: Amanda Kim, female    DOB: 19-Aug-1945, 67 y.o.   MRN: [id number]  HPI # Nausea, vomiting this morning around breakfast time She has not been able to keep anything down Vomitus is yellow  She denies fevers, chills, significant abdominal pain. She does have abdominal discomfort. She feels cold laying in in the bed.  She denies diarrhea.  She denies any unusual foods. She ate out at [employer] last night; a salad; she eats there once a week.   She had an appointment for gallbladder removal on 04/16 which she got moved to next Thursday. She was diagnosed after presenting with stomach burning. 03/2012 ultrasound showing multiple gallstones. She was told her liver tests were significantly elevated. Dr. Carolynne Edouard is performing the surgery. Dr. Barrie Dunker internal medicine physician saw her as an outpatient. She is followed by him as well as here; she wanted to be seen by someone consistently because of constantly having her doctors changed here.   Medications tried: none  Review of Systems Denies worsening of chronic back pain, dysuria/frequency/urgency, chest pain, difficulty breathing, rash Denies abnormal vaginal discharge or irritation. She is sexually active with boyfriend only.   Allergies, medication, past medical history reviewed.  Smoking status noted.     Objective:   Physical Exam GEN: appears uncomfortable; laying on her side, not actively vomiting but she does have trash can pulled to her side MOUTH: mildly dry MM CV: RRR PULM: NI WOB BACK: no CVA tenderness ABD: soft, NT, ND, obese SKIN: 2-3 sec capillary refill    Assessment & Plan:

## 2012-05-14 NOTE — Patient Instructions (Addendum)
We will check blood tests today to evaluate liver and pancreas  Use anti-emetics as needed Zofran up to every 8 hours as needed Phenergan up to every 8 hours as needed (use suppository if not able to tolerate PO)  Go to the ED if you are not able to tolerate liquids despite the anti-emetics  Go to the ED or go to clinic if you have fevers (temeprature 101.5 or greater), significant abdominal/back pain  Diet as tolerated, however, very important you stay hydrated

## 2012-05-14 NOTE — Assessment & Plan Note (Signed)
Her nausea was improved with IM Phenergan 12.5 and she was able to keep down water.  She is comfortable with going home. Rx for Zofran and Phenergan, including Phenergan suppositories sent.  Checks labs: CMET, CBC, lipase. Consider antibiotics if indicative of cholecystitis. She does not appear to have infectious process at this time. Presentation more consistent with biliary colic.  Follow-up as needed. Go to the ED if unable to keep down fluids, symptoms of infection.

## 2012-05-14 NOTE — Telephone Encounter (Signed)
Pt is having gall bladder surgery and today she just can't keep anything down - she is wanting to know what she can do - nausea - she has tried ginger ale and crackers and nothing helps.

## 2012-05-17 ENCOUNTER — Telehealth: Payer: Self-pay | Admitting: Family Medicine

## 2012-05-17 NOTE — Telephone Encounter (Signed)
Discussed lab results. Not consistent with cholecystitis or pancreatitis.  Nausea almost fully resolved. She also had diarrhea over the weekend. With her mildly elevated WBC, she may have had gastroenteritis.

## 2012-05-18 ENCOUNTER — Other Ambulatory Visit (INDEPENDENT_AMBULATORY_CARE_PROVIDER_SITE_OTHER): Payer: Self-pay | Admitting: General Surgery

## 2012-05-18 ENCOUNTER — Encounter (HOSPITAL_COMMUNITY): Payer: Self-pay

## 2012-05-18 ENCOUNTER — Encounter (HOSPITAL_COMMUNITY)
Admission: RE | Admit: 2012-05-18 | Discharge: 2012-05-18 | Disposition: A | Discharge status: Home / Self Care | Payer: Medicare Other | Source: Ambulatory Visit | Attending: General Surgery | Admitting: General Surgery

## 2012-05-18 ENCOUNTER — Encounter (HOSPITAL_COMMUNITY)
Admission: RE | Admit: 2012-05-18 | Discharge: 2012-05-18 | Disposition: A | Discharge status: Home / Self Care | Payer: Medicare Other | Source: Ambulatory Visit | Attending: Anesthesiology | Admitting: Anesthesiology

## 2012-05-18 DIAGNOSIS — K8010 Calculus of gallbladder with chronic cholecystitis without obstruction: Secondary | ICD-10-CM | POA: Diagnosis not present

## 2012-05-18 DIAGNOSIS — Z01818 Encounter for other preprocedural examination: Secondary | ICD-10-CM | POA: Diagnosis not present

## 2012-05-18 DIAGNOSIS — E039 Hypothyroidism, unspecified: Secondary | ICD-10-CM | POA: Diagnosis not present

## 2012-05-18 DIAGNOSIS — M47817 Spondylosis without myelopathy or radiculopathy, lumbosacral region: Secondary | ICD-10-CM | POA: Diagnosis not present

## 2012-05-18 DIAGNOSIS — J984 Other disorders of lung: Secondary | ICD-10-CM | POA: Diagnosis not present

## 2012-05-18 DIAGNOSIS — I10 Essential (primary) hypertension: Secondary | ICD-10-CM | POA: Diagnosis not present

## 2012-05-18 HISTORY — DX: Enthesopathy, unspecified: M77.9

## 2012-05-18 HISTORY — DX: Spondylolisthesis, site unspecified: M43.10

## 2012-05-18 HISTORY — DX: Other chronic pain: G89.29

## 2012-05-18 HISTORY — DX: Frequency of micturition: R35.0

## 2012-05-18 HISTORY — DX: Dorsalgia, unspecified: M54.9

## 2012-05-18 HISTORY — DX: Cramp and spasm: R25.2

## 2012-05-18 HISTORY — DX: Nausea with vomiting, unspecified: R11.2

## 2012-05-18 HISTORY — DX: Essential (primary) hypertension: I10

## 2012-05-18 HISTORY — DX: Personal history of colon polyps: Z86.010

## 2012-05-18 HISTORY — DX: Headache: R51

## 2012-05-18 HISTORY — DX: Personal history of colon polyps, unspecified: Z86.0100

## 2012-05-18 LAB — BASIC METABOLIC PANEL
BUN: 16 mg/dL (ref 6–23)
CO2: 25 mEq/L (ref 19–32)
GFR calc non Af Amer: 87 mL/min — ABNORMAL LOW (ref >90)
Glucose, Bld: 125 mg/dL — ABNORMAL HIGH (ref 70–99)
Potassium: 3.6 mEq/L (ref 3.5–5.1)

## 2012-05-18 LAB — CBC
Hemoglobin: 14.5 g/dL (ref 12.0–15.0)
MCH: 30.6 pg (ref 26.0–34.0)
MCHC: 35.3 g/dL (ref 30.0–36.0)
MCV: 86.7 fL (ref 78.0–100.0)

## 2012-05-18 MED ORDER — CHLORHEXIDINE GLUCONATE 4 % EX LIQD: 1.0000 "application " | Freq: Once | CUTANEOUS | Status: DC

## 2012-05-18 NOTE — Pre-Procedure Instructions (Signed)
Amanda Kim ZOXWRUE  05/18/2012   Your procedure is scheduled on: Thurs, April 3 @ 2:15 PM  Report to [facility] at 12:15 PM.  Call this number if you have problems the morning of surgery: [phone]   Remember:   Do not eat food or drink liquids after midnight.   Take these medicines the morning of surgery with A SIP OF WATER: Propranolol(Inderal),Promethazine(Phenergan),Pain Pill(if needed),Gabapentin(Neurontin),and Synthroid(Levothyroxine)              Stop taking your Fish Oil,Ibuprofen,Aleve,and COQ10.No Aspirin,Goody's,BC's,or Herbal Medications   Do not wear jewelry, make-up or nail polish.  Do not wear lotions, powders, or perfumes. You may wear deodorant.  Do not shave 48 hours prior to surgery.  Do not bring valuables to the hospital.  Contacts, dentures or bridgework may not be worn into surgery.  Leave suitcase in the car. After surgery it may be brought to your room.  For patients admitted to the hospital, checkout time is 11:00 AM the day of  discharge.   Patients discharged the day of surgery will not be allowed to drive  home.    Special Instructions: Shower using CHG 2 nights before surgery and the night before surgery.  If you shower the day of surgery use CHG.  Use special wash - you have one bottle of CHG for all showers.  You should use approximately 1/3 of the bottle for each shower.   Please read over the following fact sheets that you were given: Pain Booklet, Coughing and Deep Breathing, MRSA Information and Surgical Site Infection Prevention

## 2012-05-18 NOTE — Progress Notes (Signed)
Pt doesn't have a cardiologist  Denies ever having an echo/stress test/heart cath  Medical Md is with [facility] Dr. Shelly Flatten  Denies EKG or CXR within past yr

## 2012-05-18 NOTE — Progress Notes (Signed)
Anesthesia Chart Review:  Patient is a 67 year old female scheduled for laparoscopic cholecystectomy on 05/20/12 by Dr. Carolynne Edouard.  History includes former smoker, HLD, HTN, hypothyroidism, chronic pain, lumbar spondylolisthesis. PCP is Dr. Shelly Flatten with [facility], who is aware of upcoming surgery (see office note 05/06/12).    CXR report on 05/18/12 showed minimal subsegmental atelectasis versus scarring in right upper lobe.  Preoperative labs noted.  EKG on 05/18/12 showed SB @ 57 bpm, PACs, cannot rule out anterior infarct (age undetermined), non-specific T wave flattening.  There are no comparison EKGs available in Epic, Muse, or at her PCP office. No CV symptoms were documented at her PAT visit.  She has no known history of MI/CHF or DM.  She will be evaluated by her assigned anesthesiologist on the day of surgery.  If no significant change or new CV symptoms then I would anticipate that she could proceed as planned.  Velna Ochs [facility]/Anesthesiology Phone [phone] 05/18/2012 1:15 PM

## 2012-05-19 MED ORDER — CIPROFLOXACIN IN D5W 400 MG/200ML IV SOLN
400.0000 mg | INTRAVENOUS | Status: AC
  Administered 2012-05-20: 400 mg via INTRAVENOUS
  Filled 2012-05-19 (×2): qty 200

## 2012-05-20 ENCOUNTER — Encounter (HOSPITAL_COMMUNITY): Payer: Self-pay | Admitting: *Deleted

## 2012-05-20 ENCOUNTER — Encounter (HOSPITAL_COMMUNITY): Payer: Self-pay | Admitting: Vascular Surgery

## 2012-05-20 ENCOUNTER — Ambulatory Visit (HOSPITAL_COMMUNITY)
Admission: RE | Admit: 2012-05-20 | Discharge: 2012-05-20 | Disposition: A | Discharge status: Home / Self Care | Payer: Medicare Other | Source: Ambulatory Visit | Attending: General Surgery | Admitting: General Surgery

## 2012-05-20 ENCOUNTER — Encounter (HOSPITAL_COMMUNITY)
Admission: RE | Disposition: A | Discharge status: Home / Self Care | Payer: Self-pay | Source: Ambulatory Visit | Attending: General Surgery

## 2012-05-20 ENCOUNTER — Ambulatory Visit (HOSPITAL_COMMUNITY): Payer: Medicare Other

## 2012-05-20 ENCOUNTER — Ambulatory Visit (HOSPITAL_COMMUNITY): Payer: Medicare Other | Admitting: Certified Registered"

## 2012-05-20 DIAGNOSIS — I10 Essential (primary) hypertension: Secondary | ICD-10-CM | POA: Insufficient documentation

## 2012-05-20 DIAGNOSIS — E039 Hypothyroidism, unspecified: Secondary | ICD-10-CM | POA: Diagnosis not present

## 2012-05-20 DIAGNOSIS — K8010 Calculus of gallbladder with chronic cholecystitis without obstruction: Secondary | ICD-10-CM | POA: Insufficient documentation

## 2012-05-20 DIAGNOSIS — K8020 Calculus of gallbladder without cholecystitis without obstruction: Secondary | ICD-10-CM | POA: Diagnosis not present

## 2012-05-20 HISTORY — PX: CHOLECYSTECTOMY: SHX55

## 2012-05-20 SURGERY — LAPAROSCOPIC CHOLECYSTECTOMY WITH INTRAOPERATIVE CHOLANGIOGRAM
Anesthesia: General | Site: Abdomen | Wound class: Clean Contaminated

## 2012-05-20 MED ORDER — LIDOCAINE HCL (CARDIAC) 20 MG/ML IV SOLN
INTRAVENOUS | Status: DC | PRN
  Administered 2012-05-20: 100 mg via INTRAVENOUS

## 2012-05-20 MED ORDER — HYDROMORPHONE HCL PF 1 MG/ML IJ SOLN
INTRAMUSCULAR | Status: AC
  Filled 2012-05-20: qty 1

## 2012-05-20 MED ORDER — 0.9 % SODIUM CHLORIDE (POUR BTL) OPTIME
TOPICAL | Status: DC | PRN
  Administered 2012-05-20: 1000 mL

## 2012-05-20 MED ORDER — SODIUM CHLORIDE 0.9 % IV SOLN: 250.0000 mL | INTRAVENOUS | Status: DC | PRN

## 2012-05-20 MED ORDER — ONDANSETRON HCL 4 MG/2ML IJ SOLN: 4.0000 mg | Freq: Once | INTRAMUSCULAR | Status: DC | PRN

## 2012-05-20 MED ORDER — MIDAZOLAM HCL 5 MG/5ML IJ SOLN
INTRAMUSCULAR | Status: DC | PRN
  Administered 2012-05-20: 2 mg via INTRAVENOUS

## 2012-05-20 MED ORDER — EPHEDRINE SULFATE 50 MG/ML IJ SOLN
INTRAMUSCULAR | Status: DC | PRN
  Administered 2012-05-20: 15 mg via INTRAVENOUS
  Administered 2012-05-20: 10 mg via INTRAVENOUS

## 2012-05-20 MED ORDER — OXYCODONE HCL 5 MG/5ML PO SOLN: 5.0000 mg | Freq: Once | ORAL | Status: DC | PRN

## 2012-05-20 MED ORDER — HYDROMORPHONE HCL PF 1 MG/ML IJ SOLN: 0.2500 mg | INTRAMUSCULAR | Status: DC | PRN

## 2012-05-20 MED ORDER — ACETAMINOPHEN 650 MG RE SUPP: 650.0000 mg | RECTAL | Status: DC | PRN

## 2012-05-20 MED ORDER — OXYCODONE HCL 5 MG PO TABS: 5.0000 mg | ORAL_TABLET | Freq: Once | ORAL | Status: DC | PRN

## 2012-05-20 MED ORDER — MORPHINE SULFATE 2 MG/ML IJ SOLN: 2.0000 mg | INTRAMUSCULAR | Status: DC | PRN

## 2012-05-20 MED ORDER — ONDANSETRON HCL 4 MG/2ML IJ SOLN
INTRAMUSCULAR | Status: AC
  Filled 2012-05-20: qty 2

## 2012-05-20 MED ORDER — BUPIVACAINE-EPINEPHRINE 0.25% -1:200000 IJ SOLN
INTRAMUSCULAR | Status: DC | PRN
  Administered 2012-05-20: 14 mL

## 2012-05-20 MED ORDER — PROPOFOL 10 MG/ML IV BOLUS
INTRAVENOUS | Status: DC | PRN
  Administered 2012-05-20: 150 mg via INTRAVENOUS

## 2012-05-20 MED ORDER — BUPIVACAINE-EPINEPHRINE 0.25% -1:200000 IJ SOLN
INTRAMUSCULAR | Status: AC
  Filled 2012-05-20: qty 1

## 2012-05-20 MED ORDER — ONDANSETRON HCL 4 MG/2ML IJ SOLN
INTRAMUSCULAR | Status: DC | PRN
  Administered 2012-05-20: 4 mg via INTRAVENOUS

## 2012-05-20 MED ORDER — NEOSTIGMINE METHYLSULFATE 1 MG/ML IJ SOLN
INTRAMUSCULAR | Status: DC | PRN
  Administered 2012-05-20: 4 mg via INTRAVENOUS

## 2012-05-20 MED ORDER — GLYCOPYRROLATE 0.2 MG/ML IJ SOLN
INTRAMUSCULAR | Status: DC | PRN
  Administered 2012-05-20: 0.6 mg via INTRAVENOUS

## 2012-05-20 MED ORDER — SODIUM CHLORIDE 0.9 % IJ SOLN: 3.0000 mL | INTRAMUSCULAR | Status: DC | PRN

## 2012-05-20 MED ORDER — SODIUM CHLORIDE 0.9 % IV SOLN
INTRAVENOUS | Status: DC | PRN
  Administered 2012-05-20: 14:00:00

## 2012-05-20 MED ORDER — OXYCODONE HCL 5 MG PO TABS: 5.0000 mg | ORAL_TABLET | ORAL | Status: DC | PRN

## 2012-05-20 MED ORDER — ONDANSETRON HCL 4 MG/2ML IJ SOLN: 4.0000 mg | Freq: Four times a day (QID) | INTRAMUSCULAR | Status: DC | PRN

## 2012-05-20 MED ORDER — HYDROMORPHONE HCL PF 1 MG/ML IJ SOLN
0.2500 mg | INTRAMUSCULAR | Status: DC | PRN
  Administered 2012-05-20: 0.5 mg via INTRAVENOUS
  Administered 2012-05-20 (×2): 0.25 mg via INTRAVENOUS
  Administered 2012-05-20 (×2): 0.5 mg via INTRAVENOUS

## 2012-05-20 MED ORDER — HYDROCODONE-ACETAMINOPHEN 5-325 MG PO TABS: 1.0000 | ORAL_TABLET | Freq: Four times a day (QID) | ORAL | Status: DC | PRN

## 2012-05-20 MED ORDER — FENTANYL CITRATE 0.05 MG/ML IJ SOLN
INTRAMUSCULAR | Status: DC | PRN
  Administered 2012-05-20: 100 ug via INTRAVENOUS
  Administered 2012-05-20 (×2): 50 ug via INTRAVENOUS

## 2012-05-20 MED ORDER — ARTIFICIAL TEARS OP OINT
TOPICAL_OINTMENT | OPHTHALMIC | Status: DC | PRN
  Administered 2012-05-20: 1 via OPHTHALMIC

## 2012-05-20 MED ORDER — MEPERIDINE HCL 25 MG/ML IJ SOLN: 6.2500 mg | INTRAMUSCULAR | Status: DC | PRN

## 2012-05-20 MED ORDER — ACETAMINOPHEN 325 MG PO TABS: 650.0000 mg | ORAL_TABLET | ORAL | Status: DC | PRN

## 2012-05-20 MED ORDER — KCL IN DEXTROSE-NACL 20-5-0.9 MEQ/L-%-% IV SOLN: INTRAVENOUS | Status: DC

## 2012-05-20 MED ORDER — SODIUM CHLORIDE 0.9 % IR SOLN
Status: DC | PRN
  Administered 2012-05-20: 1000 mL

## 2012-05-20 MED ORDER — SODIUM CHLORIDE 0.9 % IJ SOLN: 3.0000 mL | Freq: Two times a day (BID) | INTRAMUSCULAR | Status: DC

## 2012-05-20 MED ORDER — LACTATED RINGERS IV SOLN
INTRAVENOUS | Status: DC
  Administered 2012-05-20 (×2): via INTRAVENOUS

## 2012-05-20 MED ORDER — ROCURONIUM BROMIDE 100 MG/10ML IV SOLN
INTRAVENOUS | Status: DC | PRN
  Administered 2012-05-20: 50 mg via INTRAVENOUS

## 2012-05-20 SURGICAL SUPPLY — 39 items
APPLIER CLIP ROT 10 11.4 M/L (STAPLE) ×2
BLADE SURG ROTATE 9660 (MISCELLANEOUS) IMPLANT
CANISTER SUCTION 2500CC (MISCELLANEOUS) ×2 IMPLANT
CATH REDDICK CHOLANGI 4FR 50CM (CATHETERS) ×2 IMPLANT
CHLORAPREP W/TINT 26ML (MISCELLANEOUS) ×2 IMPLANT
CLIP APPLIE ROT 10 11.4 M/L (STAPLE) ×1 IMPLANT
CLOTH BEACON ORANGE TIMEOUT ST (SAFETY) ×2 IMPLANT
COVER MAYO STAND STRL (DRAPES) ×2 IMPLANT
COVER SURGICAL LIGHT HANDLE (MISCELLANEOUS) ×2 IMPLANT
DECANTER SPIKE VIAL GLASS SM (MISCELLANEOUS) ×2 IMPLANT
DERMABOND ADVANCED (GAUZE/BANDAGES/DRESSINGS) ×1
DERMABOND ADVANCED .7 DNX12 (GAUZE/BANDAGES/DRESSINGS) ×1 IMPLANT
DRAPE C-ARM 42X72 X-RAY (DRAPES) ×2 IMPLANT
DRAPE UTILITY 15X26 W/TAPE STR (DRAPE) ×4 IMPLANT
ELECT REM PT RETURN 9FT ADLT (ELECTROSURGICAL) ×2
ELECTRODE REM PT RTRN 9FT ADLT (ELECTROSURGICAL) ×1 IMPLANT
GLOVE BIO SURGEON STRL SZ7 (GLOVE) ×2 IMPLANT
GLOVE BIO SURGEON STRL SZ7.5 (GLOVE) ×4 IMPLANT
GLOVE BIOGEL PI IND STRL 7.5 (GLOVE) ×3 IMPLANT
GLOVE BIOGEL PI INDICATOR 7.5 (GLOVE) ×3
GLOVE ECLIPSE 7.5 STRL STRAW (GLOVE) ×2 IMPLANT
GOWN STRL NON-REIN LRG LVL3 (GOWN DISPOSABLE) ×8 IMPLANT
IV CATH 14GX2 1/4 (CATHETERS) ×2 IMPLANT
KIT BASIN OR (CUSTOM PROCEDURE TRAY) ×2 IMPLANT
KIT ROOM TURNOVER OR (KITS) ×2 IMPLANT
NS IRRIG 1000ML POUR BTL (IV SOLUTION) ×2 IMPLANT
PAD ARMBOARD 7.5X6 YLW CONV (MISCELLANEOUS) ×2 IMPLANT
POUCH SPECIMEN RETRIEVAL 10MM (ENDOMECHANICALS) ×2 IMPLANT
SCISSORS LAP 5X35 DISP (ENDOMECHANICALS) IMPLANT
SET IRRIG TUBING LAPAROSCOPIC (IRRIGATION / IRRIGATOR) ×2 IMPLANT
SLEEVE ENDOPATH XCEL 5M (ENDOMECHANICALS) ×2 IMPLANT
SPECIMEN JAR SMALL (MISCELLANEOUS) ×2 IMPLANT
SUT MNCRL AB 4-0 PS2 18 (SUTURE) ×2 IMPLANT
TOWEL OR 17X24 6PK STRL BLUE (TOWEL DISPOSABLE) ×2 IMPLANT
TOWEL OR 17X26 10 PK STRL BLUE (TOWEL DISPOSABLE) ×2 IMPLANT
TRAY LAPAROSCOPIC (CUSTOM PROCEDURE TRAY) ×2 IMPLANT
TROCAR XCEL BLUNT TIP 100MML (ENDOMECHANICALS) ×2 IMPLANT
TROCAR XCEL NON-BLD 11X100MML (ENDOMECHANICALS) ×2 IMPLANT
TROCAR XCEL NON-BLD 5MMX100MML (ENDOMECHANICALS) ×2 IMPLANT

## 2012-05-20 NOTE — Anesthesia Postprocedure Evaluation (Signed)
Anesthesia Post Note  Patient: Amanda Kim  Procedure(s) Performed: Procedure(s) (LRB): LAPAROSCOPIC CHOLECYSTECTOMY WITH INTRAOPERATIVE CHOLANGIOGRAM (N/A)  Anesthesia type: General  Patient location: PACU  Post pain: Pain level controlled and Adequate analgesia  Post assessment: Post-op Vital signs reviewed, Patient's Cardiovascular Status Stable, Respiratory Function Stable, Patent Airway and Pain level controlled  Last Vitals:  Filed Vitals:   05/20/12 1735  BP: 146/68  Pulse: 51  Temp: 36.3 C  Resp:     Post vital signs: Reviewed and stable  Level of consciousness: awake, alert  and oriented  Complications: No apparent anesthesia complications

## 2012-05-20 NOTE — Transfer of Care (Signed)
Immediate Anesthesia Transfer of Care Note  Patient: Amanda Kim  Procedure(s) Performed: Procedure(s): LAPAROSCOPIC CHOLECYSTECTOMY WITH INTRAOPERATIVE CHOLANGIOGRAM (N/A)  Patient Location: PACU  Anesthesia Type:General  Level of Consciousness: awake, alert  and oriented  Airway & Oxygen Therapy: Patient Spontanous Breathing and Patient connected to face mask oxygen  Post-op Assessment: Report given to PACU RN  Post vital signs: Reviewed and stable  Complications: No apparent anesthesia complications

## 2012-05-20 NOTE — H&P (Signed)
Amanda Kim  04/13/2012 11:30 AM   Office Visit  MRN:  [id number]   Description: 67 year old female  Provider: Robyne Askew, MD  Department: [facility]        Diagnoses    Gallstones    -  Primary    [phone]      Reason for Visit    New Evaluation    eval gallbladder        Current Vitals - Last Recorded    BP Pulse Temp(Src) Resp Ht Wt    140/84 64 97.7 F (36.5 C) 20 5\' 4"  (1.626 m) 166 lb (75.297 kg)       BMI              28.48 kg/m2                 Progress Notes    Amanda Askew, MD at 04/13/2012 12:39 PM    Status: Signed                   Subjective:       Patient ID: Amanda Kim, female   DOB: 06-16-1945, 67 y.o.   MRN: [id number]   HPI We're asked to see the patient in consultation by Dr. Konrad Kim to evaluate her for gallstones. The patient is a 67 year old white female who was recently at a conference in [place] when she developed some epigastric pain. The pain gradually resolved. A couple weeks ago she then developed pain again at her boyfriend. The pain would come and go. She denies any nausea or vomiting. She went to see her medical doctor and was found to have elevated liver functions. On subsequent blood draws her liver functions return to normal. She also had an ultrasound that showed stones in the gallbladder but no gallbladder wall thickening or ductal dilatation   Review of Systems  Constitutional: Negative.   HENT: Negative.   Eyes: Negative.   Respiratory: Negative.   Cardiovascular: Negative.   Gastrointestinal: Positive for abdominal pain.  Endocrine: Negative.   Genitourinary: Negative.   Musculoskeletal: Negative.   Skin: Negative.   Allergic/Immunologic: Negative.   Neurological: Negative.   Hematological: Negative.   Psychiatric/Behavioral: Negative.           Objective:     Physical Exam  Constitutional: She is oriented to person, place, and time. She appears well-developed and well-nourished.   HENT:   Head: Normocephalic and atraumatic.  Eyes: Conjunctivae and EOM are normal. Pupils are equal, round, and reactive to light.  Neck: Normal range of motion. Neck supple.  Cardiovascular: Normal rate, regular rhythm and normal heart sounds.   Pulmonary/Chest: Effort normal and breath sounds normal.  Abdominal: Soft. Bowel sounds are normal. There is tenderness.  Musculoskeletal: Normal range of motion.  Neurological: She is alert and oriented to person, place, and time.  Skin: Skin is warm and dry.  Psychiatric: She has a normal mood and affect. Her behavior is normal.          Assessment:       The patient has what appears to be symptomatic gallstones. Because of the risk of further painful episodes and possible pancreatitis I think she would benefit from having her gallbladder removed. She would also like to have this done. I discussed with her in detail the risks and benefits of the operation to remove the gallbladder as well as some of the technical aspects and she understands and  wishes to proceed      Plan:       Plan for laparoscopic cholecystectomy with intraoperative cholangiogram

## 2012-05-20 NOTE — Op Note (Signed)
05/20/2012  3:02 PM  PATIENT:  Amanda Kim  67 y.o. female  PRE-OPERATIVE DIAGNOSIS:  cholelithiasis  POST-OPERATIVE DIAGNOSIS:  cholelithiasis  PROCEDURE:  Procedure(s): LAPAROSCOPIC CHOLECYSTECTOMY WITH INTRAOPERATIVE CHOLANGIOGRAM (N/A)  SURGEON:  Surgeon(s) and Role:    * Robyne Askew, MD - Primary    * Wilmon Arms. Sharel Skains, MD - Assisting  PHYSICIAN ASSISTANT:   ASSISTANTS: Dr. Jlyn Skains   ANESTHESIA:   general  EBL:  Total I/O In: 1000 [I.V.:1000] Out: -   BLOOD ADMINISTERED:none  DRAINS: none   LOCAL MEDICATIONS USED:  MARCAINE     SPECIMEN:  Source of Specimen:  gallbladder  DISPOSITION OF SPECIMEN:  PATHOLOGY  COUNTS:  YES  TOURNIQUET:  * No tourniquets in log *  DICTATION: .Dragon Dictation  Procedure: After informed consent was obtained the patient was brought to the operating room and placed in the supine position on the operating room table. After adequate induction of general anesthesia the patient's abdomen was prepped with ChloraPrep allowed to dry and draped in usual sterile manner. The area below the umbilicus was infiltrated with quarter percent  Marcaine. A small incision was made with a 15 blade knife. The incision was carried down through the subcutaneous tissue bluntly with a hemostat and [employer] retractors. The linea alba was identified. The linea alba was incised with a 15 blade knife and each side was grasped with Coker clamps. The preperitoneal space was then probed with a hemostat until the peritoneum was opened and access was gained to the abdominal cavity. A 0 Vicryl pursestring stitch was placed in the fascia surrounding the opening. A Hassan cannula was then placed through the opening and anchored in place with the previously placed Vicryl purse string stitch. The abdomen was insufflated with carbon dioxide without difficulty. A laparoscope was inserted through the [facility] cannula in the right upper quadrant was inspected. Next the epigastric  region was infiltrated with % Marcaine. A small incision was made with a 15 blade knife. A 10 mm port was placed bluntly through this incision into the abdominal cavity under direct vision. Next 2 sites were chosen laterally on the right side of the abdomen for placement of 5 mm ports. Each of these areas was infiltrated with quarter percent Marcaine. Small stab incisions were made with a 15 blade knife. 5 mm ports were then placed bluntly through these incisions into the abdominal cavity under direct vision without difficulty. A blunt grasper was placed through the lateralmost 5 mm port and used to grasp the dome of the gallbladder and elevated anteriorly and superiorly. Another blunt grasper was placed through the other 5 mm port and used to retract the body and neck of the gallbladder. A dissector was placed through the epigastric port and using the electrocautery the peritoneal reflection at the gallbladder neck was opened. Blunt dissection was then carried out in this area until the gallbladder neck-cystic duct junction was readily identified and a good window was created. A single clip was placed on the gallbladder neck. A small  ductotomy was made just below the clip with laparoscopic scissors. A 14-gauge Angiocath was then placed through the anterior abdominal wall under direct vision. A Reddick cholangiogram catheter was then placed through the Angiocath and flushed. The catheter was then placed in the cystic duct and anchored in place with a clip. A cholangiogram was obtained that showed no filling defects good emptying into the duodenum an adequate length on the cystic duct. The anchoring clip and catheters were  then removed from the patient. 3 clips were placed proximally on the cystic duct and the duct was divided between the 2 sets of clips. Posterior to this the cystic artery was identified and again dissected bluntly in a circumferential manner until a good window  was created. 2 clips were placed  proximally and one distally on the artery and the artery was divided between the 2 sets of clips. Next a laparoscopic hook cautery device was used to separate the gallbladder from the liver bed. Prior to completely detaching the gallbladder from the liver bed the liver bed was inspected and several small bleeding points were coagulated with the electrocautery until the area was completely hemostatic. The gallbladder was then detached the rest of it from the liver bed without difficulty. A laparoscopic bag was inserted through the epigastric port. The gallbladder was placed within the bag and the bag was sealed. A laparoscope was then moved to the epigastric port. The gallbladder grasper was placed through the [facility] cannula and used to grasp the opening of the bag. The bag with the gallbladder was then removed with the [facility] cannula through the infraumbilical port without difficulty. The fascial defect was then closed with the previously placed Vicryl pursestring stitch as well as with another figure-of-eight 0 Vicryl stitch. The liver bed was inspected again and found to be hemostatic. The abdomen was irrigated with copious amounts of saline until the effluent was clear. The ports were then removed under direct vision without difficulty and were found to be hemostatic. The gas was allowed to escape. The skin incisions were all closed with interrupted 4-0 Monocryl subcuticular stitches. Dermabond dressings were applied. The patient tolerated the procedure well. At the end of the case all needle sponge and instrument counts were correct. The patient was then awakened and taken to recovery in stable condition  PLAN OF CARE: Discharge to home after PACU  PATIENT DISPOSITION:  PACU - hemodynamically stable.   Delay start of Pharmacological VTE agent (>24hrs) due to surgical blood loss or risk of bleeding: not applicable

## 2012-05-20 NOTE — Anesthesia Procedure Notes (Signed)
Procedure Name: Intubation Date/Time: 05/20/2012 1:57 PM Performed by: Jefm Miles E Pre-anesthesia Checklist: Patient identified, Timeout performed, Emergency Drugs available, Suction available and Patient being monitored Patient Re-evaluated:Patient Re-evaluated prior to inductionOxygen Delivery Method: Circle system utilized Preoxygenation: Pre-oxygenation with 100% oxygen Intubation Type: IV induction Ventilation: Mask ventilation without difficulty Laryngoscope Size: Mac and 3 Grade View: Grade I Tube type: Oral Tube size: 7.0 mm Number of attempts: 1 Airway Equipment and Method: Stylet Placement Confirmation: ETT inserted through vocal cords under direct vision,  breath sounds checked- equal and bilateral and positive ETCO2 Secured at: 21 cm Tube secured with: Tape Dental Injury: Teeth and Oropharynx as per pre-operative assessment

## 2012-05-20 NOTE — Progress Notes (Signed)
Pt became nauseated when getting up to WC.  Zofran given.  Will monitor.

## 2012-05-20 NOTE — Anesthesia Preprocedure Evaluation (Addendum)
Anesthesia Evaluation  Patient identified by MRN, date of birth, ID band Patient awake    Reviewed: Allergy & Precautions, H&P , NPO status , Patient's Chart, lab work & pertinent test results, reviewed documented beta blocker date and time   Airway Mallampati: I TM Distance: >3 FB Neck ROM: Full    Dental  (+) Teeth Intact and Dental Advisory Given   Pulmonary  breath sounds clear to auscultation        Cardiovascular hypertension, Pt. on medications and Pt. on home beta blockers Rhythm:Regular Rate:Normal     Neuro/Psych    GI/Hepatic   Endo/Other  Hypothyroidism   Renal/GU      Musculoskeletal   Abdominal   Peds  Hematology   Anesthesia Other Findings   Reproductive/Obstetrics                          Anesthesia Physical Anesthesia Plan  ASA: II  Anesthesia Plan: General   Post-op Pain Management:    Induction: Intravenous  Airway Management Planned: Oral ETT  Additional Equipment:   Intra-op Plan:   Post-operative Plan: Extubation in OR  Informed Consent: I have reviewed the patients History and Physical, chart, labs and discussed the procedure including the risks, benefits and alternatives for the proposed anesthesia with the patient or authorized representative who has indicated his/her understanding and acceptance.     Plan Discussed with: CRNA and Surgeon  Anesthesia Plan Comments:         Anesthesia Quick Evaluation

## 2012-05-20 NOTE — Preoperative (Signed)
Beta Blockers   Reason not to administer Beta Blockers:Not Applicable 

## 2012-05-21 ENCOUNTER — Encounter (HOSPITAL_COMMUNITY): Payer: Self-pay | Admitting: General Surgery

## 2012-05-25 ENCOUNTER — Other Ambulatory Visit (HOSPITAL_COMMUNITY): Payer: Medicare Other

## 2012-05-31 ENCOUNTER — Other Ambulatory Visit: Payer: Self-pay | Admitting: Dermatology

## 2012-05-31 DIAGNOSIS — C44211 Basal cell carcinoma of skin of unspecified ear and external auricular canal: Secondary | ICD-10-CM | POA: Diagnosis not present

## 2012-05-31 DIAGNOSIS — D230 Other benign neoplasm of skin of lip: Secondary | ICD-10-CM | POA: Diagnosis not present

## 2012-05-31 DIAGNOSIS — D485 Neoplasm of uncertain behavior of skin: Secondary | ICD-10-CM | POA: Diagnosis not present

## 2012-05-31 DIAGNOSIS — IMO0002 Reserved for concepts with insufficient information to code with codable children: Secondary | ICD-10-CM | POA: Diagnosis not present

## 2012-05-31 DIAGNOSIS — D2320 Other benign neoplasm of skin of unspecified ear and external auricular canal: Secondary | ICD-10-CM | POA: Diagnosis not present

## 2012-05-31 DIAGNOSIS — M47817 Spondylosis without myelopathy or radiculopathy, lumbosacral region: Secondary | ICD-10-CM | POA: Diagnosis not present

## 2012-06-01 ENCOUNTER — Encounter (INDEPENDENT_AMBULATORY_CARE_PROVIDER_SITE_OTHER): Payer: Self-pay

## 2012-06-08 ENCOUNTER — Encounter (INDEPENDENT_AMBULATORY_CARE_PROVIDER_SITE_OTHER): Payer: Self-pay | Admitting: General Surgery

## 2012-06-08 ENCOUNTER — Ambulatory Visit (INDEPENDENT_AMBULATORY_CARE_PROVIDER_SITE_OTHER): Payer: Medicare Other | Admitting: General Surgery

## 2012-06-08 VITALS — BP 152/70 | HR 60 | Temp 100.3°F | Resp 12 | Ht 64.0 in | Wt 162.0 lb

## 2012-06-08 DIAGNOSIS — M549 Dorsalgia, unspecified: Secondary | ICD-10-CM

## 2012-06-08 DIAGNOSIS — K8020 Calculus of gallbladder without cholecystitis without obstruction: Secondary | ICD-10-CM

## 2012-06-08 NOTE — Progress Notes (Signed)
Subjective:     Patient ID: Amanda Kim, female   DOB: 1945-12-05, 67 y.o.   MRN: [id number]  HPI The patient is a 67 year old white female who is about 2 weeks status post laparoscopic cholecystectomy. From a gallbladder standpoint she is doing well. Her appetite is good and her bowels are working normally. She's had only one episode of diarrhea. She denies any abdominal pain. She does note low back pain that has been going on since October. She went to an orthopedist for a back injection. At that time he ordered an MRI study which showed a lesion on her spine. She needs a referral to a spine surgeon to evaluate this. She also has a basal cell cancer on her nose that is in the process of being removed.  Review of Systems     Objective:   Physical Exam On exam her abdomen is soft and nontender. Her incisions are all healing nicely with no sign of infection    Assessment:     The patient is 2 weeks status post laparoscopic cholecystectomy     Plan:     At this point she may return to her normal activities without restrictions. We will plan to refer her to Dr. Marikay Alar in neurosurgery to evaluate her spine lesion.

## 2012-06-08 NOTE — Patient Instructions (Signed)
May return to normal activities 

## 2012-06-10 ENCOUNTER — Telehealth (INDEPENDENT_AMBULATORY_CARE_PROVIDER_SITE_OTHER): Payer: Self-pay | Admitting: General Surgery

## 2012-06-10 NOTE — Telephone Encounter (Signed)
Spoke with patient she is aware on appt with Dr Yetta Barre 06/15/12 at 3pm

## 2012-06-10 NOTE — Telephone Encounter (Signed)
Spoke with patient she was wanting to know when her appt   With Dr Yetta Barre was I called  Dr Yetta Barre offices and left a message with Thayer Ohm to call me for a follow up on a day and time .

## 2012-06-11 DIAGNOSIS — C44319 Basal cell carcinoma of skin of other parts of face: Secondary | ICD-10-CM | POA: Diagnosis not present

## 2012-06-15 DIAGNOSIS — M4310 Spondylolisthesis, site unspecified: Secondary | ICD-10-CM | POA: Diagnosis not present

## 2012-06-15 DIAGNOSIS — M48061 Spinal stenosis, lumbar region without neurogenic claudication: Secondary | ICD-10-CM | POA: Diagnosis not present

## 2012-06-15 DIAGNOSIS — M545 Low back pain: Secondary | ICD-10-CM | POA: Diagnosis not present

## 2012-06-24 DIAGNOSIS — M1991 Primary osteoarthritis, unspecified site: Secondary | ICD-10-CM | POA: Diagnosis not present

## 2012-06-24 DIAGNOSIS — Z0000 Encounter for general adult medical examination without abnormal findings: Secondary | ICD-10-CM | POA: Diagnosis not present

## 2012-06-24 DIAGNOSIS — R5381 Other malaise: Secondary | ICD-10-CM | POA: Diagnosis not present

## 2012-06-24 DIAGNOSIS — M47817 Spondylosis without myelopathy or radiculopathy, lumbosacral region: Secondary | ICD-10-CM | POA: Diagnosis not present

## 2012-06-24 DIAGNOSIS — K7689 Other specified diseases of liver: Secondary | ICD-10-CM | POA: Diagnosis not present

## 2012-06-24 DIAGNOSIS — R5383 Other fatigue: Secondary | ICD-10-CM | POA: Diagnosis not present

## 2012-06-28 ENCOUNTER — Encounter (INDEPENDENT_AMBULATORY_CARE_PROVIDER_SITE_OTHER): Payer: Medicare Other | Admitting: General Surgery

## 2012-06-30 ENCOUNTER — Encounter: Payer: Self-pay | Admitting: Home Health Services

## 2012-06-30 DIAGNOSIS — R7309 Other abnormal glucose: Secondary | ICD-10-CM | POA: Diagnosis not present

## 2012-07-02 DIAGNOSIS — K7689 Other specified diseases of liver: Secondary | ICD-10-CM | POA: Diagnosis not present

## 2012-07-02 DIAGNOSIS — H908 Mixed conductive and sensorineural hearing loss, unspecified: Secondary | ICD-10-CM | POA: Diagnosis not present

## 2012-07-02 DIAGNOSIS — R7301 Impaired fasting glucose: Secondary | ICD-10-CM | POA: Diagnosis not present

## 2012-07-02 DIAGNOSIS — M549 Dorsalgia, unspecified: Secondary | ICD-10-CM | POA: Diagnosis not present

## 2012-07-02 DIAGNOSIS — R0602 Shortness of breath: Secondary | ICD-10-CM | POA: Diagnosis not present

## 2012-07-06 DIAGNOSIS — R0602 Shortness of breath: Secondary | ICD-10-CM | POA: Diagnosis not present

## 2012-07-06 DIAGNOSIS — R0609 Other forms of dyspnea: Secondary | ICD-10-CM | POA: Diagnosis not present

## 2012-07-14 ENCOUNTER — Other Ambulatory Visit: Payer: Self-pay | Admitting: Family Medicine

## 2012-07-14 DIAGNOSIS — E039 Hypothyroidism, unspecified: Secondary | ICD-10-CM

## 2012-07-14 MED ORDER — LEVOTHYROXINE SODIUM 100 MCG PO TABS: 100.0000 ug | ORAL_TABLET | Freq: Every day | ORAL | Status: DC

## 2012-07-27 DIAGNOSIS — M47817 Spondylosis without myelopathy or radiculopathy, lumbosacral region: Secondary | ICD-10-CM | POA: Diagnosis not present

## 2012-07-27 DIAGNOSIS — M76899 Other specified enthesopathies of unspecified lower limb, excluding foot: Secondary | ICD-10-CM | POA: Diagnosis not present

## 2012-07-30 ENCOUNTER — Other Ambulatory Visit: Payer: Self-pay | Admitting: Family Medicine

## 2012-08-04 ENCOUNTER — Telehealth: Payer: Self-pay | Admitting: Family Medicine

## 2012-08-04 MED ORDER — ESTRADIOL 10 MCG VA TABS: 10.0000 ug | ORAL_TABLET | VAGINAL | Status: DC

## 2012-08-04 NOTE — Telephone Encounter (Signed)
Medication sent to pharmacy  

## 2012-08-04 NOTE — Telephone Encounter (Signed)
Will fwd to PCP.  ,  L, CMA  

## 2012-08-04 NOTE — Telephone Encounter (Signed)
Patient needs a new Rx for Vagifem send to [facility], faxed to  [phone].

## 2012-08-31 DIAGNOSIS — M76899 Other specified enthesopathies of unspecified lower limb, excluding foot: Secondary | ICD-10-CM | POA: Diagnosis not present

## 2012-08-31 DIAGNOSIS — M47817 Spondylosis without myelopathy or radiculopathy, lumbosacral region: Secondary | ICD-10-CM | POA: Diagnosis not present

## 2012-09-03 ENCOUNTER — Encounter: Payer: Self-pay | Admitting: Family Medicine

## 2012-09-03 ENCOUNTER — Telehealth: Payer: Self-pay | Admitting: Family Medicine

## 2012-09-03 NOTE — Telephone Encounter (Signed)
Will forward to md 

## 2012-09-03 NOTE — Telephone Encounter (Signed)
Pt is requesting a refill on Meloxicam be sent to [facility] [address]. JW

## 2012-09-07 ENCOUNTER — Ambulatory Visit (INDEPENDENT_AMBULATORY_CARE_PROVIDER_SITE_OTHER): Payer: Medicare Other | Admitting: Home Health Services

## 2012-09-07 ENCOUNTER — Encounter: Payer: Self-pay | Admitting: Home Health Services

## 2012-09-07 ENCOUNTER — Encounter (INDEPENDENT_AMBULATORY_CARE_PROVIDER_SITE_OTHER): Payer: Medicare Other | Admitting: Family Medicine

## 2012-09-07 VITALS — BP 144/83 | HR 60 | Temp 98.1°F | Ht 64.0 in | Wt 165.0 lb

## 2012-09-07 DIAGNOSIS — Z0000 Encounter for general adult medical examination without abnormal findings: Secondary | ICD-10-CM

## 2012-09-07 MED ORDER — MELOXICAM 15 MG PO TABS: 15.0000 mg | ORAL_TABLET | Freq: Every day | ORAL | Status: DC

## 2012-09-07 NOTE — Progress Notes (Signed)
Patient here for annual wellness visit, patient reports: Risk Factors/Conditions needing evaluation or treatment: Pt does not have any new risk factors that need evaluation. Home Safety: Pt lives by self.  Pt reports having smoke detectors and home security system. Other Information: Corrective lens: Pt wears daily corrective lens.  Most recent eye exam 09/07/12 at [facility]. Dentures: Pt does not have dentures.  Has regular cleaning with a dentist.  Memory: Pt reports some memory problems with names.   Balance/Gait: Pt reported no falls in the past year.  Reports legs do not feel weak and no dizziness.  Pt reports no hearing problems.    Annual Wellness Visit Requirements Recorded Today In  Medical, family, social history Past Medical, Family, Social History Section  Current providers Care team  Current medications Medications  Wt, BP, Ht, BMI Vital signs  Tobacco, alcohol, illicit drug use History  ADL Nurse Assessment  Depression Screening Nurse Assessment  Cognitive impairment Nurse Assessment  Mini Mental Status Document Flowsheet  Fall Risk Fall/Depression  Home Safety Progress Note  End of Life Planning (welcome visit) Social Documentation  Medicare preventative services Progress Note  Risk factors/conditions needing evaluation/treatment Progress Note  Personalized health advice Patient Instructions, goals, letter  Diet & Exercise Social Documentation  Emergency Contact Social Documentation  Seat Belts Social Documentation  Sun exposure/protection Social Documentation      I have reviewed this visit and discussed with Arlys John and agree with her documentation   Shelly Flatten, MD Family Medicine PGY-3 09/08/2012, 8:52 AM

## 2012-09-08 ENCOUNTER — Encounter: Payer: Self-pay | Admitting: Home Health Services

## 2012-09-15 ENCOUNTER — Telehealth: Payer: Self-pay | Admitting: Family Medicine

## 2012-09-15 NOTE — Telephone Encounter (Signed)
Pt states that she has been taking Vagisim tablets to help with the lubrication and strength of her vaginal wall.  Pt states that there is no pain with intercourse.  Blood is not bright but is usually mixed with body fluid.  Would like to know what she needs to do?  Please advise.   ,CMA

## 2012-09-15 NOTE — Telephone Encounter (Signed)
Pt is looking for advice on bleeding on after intercourse. This is been happening for about two weeks now and doesn't know if she should be seen here or with a gyn. JW

## 2012-09-16 ENCOUNTER — Telehealth: Payer: Self-pay | Admitting: Family Medicine

## 2012-09-16 NOTE — Telephone Encounter (Signed)
Likely from vaginal wall trauma from intercourse vs bloody sperm discharge from sexual partner which can also be considered normal (hematospermia). No concern unless continues

## 2012-09-17 NOTE — Telephone Encounter (Signed)
error 

## 2012-09-17 NOTE — Telephone Encounter (Signed)
Pt is aware of message. Jazmin Hartsell,CMA  

## 2012-09-22 ENCOUNTER — Other Ambulatory Visit: Payer: Self-pay | Admitting: *Deleted

## 2012-09-22 DIAGNOSIS — I10 Essential (primary) hypertension: Secondary | ICD-10-CM

## 2012-09-22 MED ORDER — PROPRANOLOL HCL 10 MG PO TABS: 10.0000 mg | ORAL_TABLET | Freq: Two times a day (BID) | ORAL | Status: DC

## 2012-09-22 MED ORDER — LEVOTHYROXINE SODIUM 100 MCG PO TABS: 100.0000 ug | ORAL_TABLET | Freq: Every day | ORAL | Status: DC

## 2012-10-06 ENCOUNTER — Encounter: Payer: Self-pay | Admitting: Family Medicine

## 2012-10-20 ENCOUNTER — Telehealth: Payer: Self-pay | Admitting: *Deleted

## 2012-10-20 DIAGNOSIS — R5381 Other malaise: Secondary | ICD-10-CM | POA: Diagnosis not present

## 2012-10-20 DIAGNOSIS — K7689 Other specified diseases of liver: Secondary | ICD-10-CM | POA: Diagnosis not present

## 2012-10-20 DIAGNOSIS — R7301 Impaired fasting glucose: Secondary | ICD-10-CM | POA: Diagnosis not present

## 2012-10-20 NOTE — Telephone Encounter (Signed)
Pt called requesting to speak with Dr Katrinka Blazing in reference to establishing care.  However pt would like to speak with Dr Katrinka Blazing prior to scheduling appoint, states she wants to be seen for something very specific.  Please advise

## 2012-10-20 NOTE — Telephone Encounter (Signed)
Call patient back and stated that I had not seen people for primary care at this time. Patient though is having body aches and some type of injury and may switch to see me for this problem. I told her that this would be fine. Patient will call to make an appointment.

## 2012-10-21 DIAGNOSIS — M47817 Spondylosis without myelopathy or radiculopathy, lumbosacral region: Secondary | ICD-10-CM | POA: Diagnosis not present

## 2012-10-27 DIAGNOSIS — N898 Other specified noninflammatory disorders of vagina: Secondary | ICD-10-CM | POA: Diagnosis not present

## 2012-10-27 DIAGNOSIS — R7301 Impaired fasting glucose: Secondary | ICD-10-CM | POA: Diagnosis not present

## 2012-10-27 DIAGNOSIS — Z23 Encounter for immunization: Secondary | ICD-10-CM | POA: Diagnosis not present

## 2012-10-27 DIAGNOSIS — K7689 Other specified diseases of liver: Secondary | ICD-10-CM | POA: Diagnosis not present

## 2012-10-27 DIAGNOSIS — E039 Hypothyroidism, unspecified: Secondary | ICD-10-CM | POA: Diagnosis not present

## 2012-10-29 NOTE — Progress Notes (Signed)
Patient ID: Amanda Kim, female   DOB: 06-21-1945, 67 y.o.   MRN: [id number]    entered in error

## 2012-11-09 DIAGNOSIS — H02839 Dermatochalasis of unspecified eye, unspecified eyelid: Secondary | ICD-10-CM | POA: Diagnosis not present

## 2012-11-09 DIAGNOSIS — M950 Acquired deformity of nose: Secondary | ICD-10-CM | POA: Diagnosis not present

## 2012-11-16 DIAGNOSIS — H02839 Dermatochalasis of unspecified eye, unspecified eyelid: Secondary | ICD-10-CM | POA: Diagnosis not present

## 2012-11-16 DIAGNOSIS — H02429 Myogenic ptosis of unspecified eyelid: Secondary | ICD-10-CM | POA: Diagnosis not present

## 2012-11-17 DIAGNOSIS — R6882 Decreased libido: Secondary | ICD-10-CM | POA: Diagnosis not present

## 2012-11-17 DIAGNOSIS — N930 Postcoital and contact bleeding: Secondary | ICD-10-CM | POA: Diagnosis not present

## 2012-11-17 DIAGNOSIS — Z124 Encounter for screening for malignant neoplasm of cervix: Secondary | ICD-10-CM | POA: Diagnosis not present

## 2012-11-17 DIAGNOSIS — N950 Postmenopausal bleeding: Secondary | ICD-10-CM | POA: Diagnosis not present

## 2012-11-18 DIAGNOSIS — M47817 Spondylosis without myelopathy or radiculopathy, lumbosacral region: Secondary | ICD-10-CM | POA: Diagnosis not present

## 2012-11-18 DIAGNOSIS — M461 Sacroiliitis, not elsewhere classified: Secondary | ICD-10-CM | POA: Diagnosis not present

## 2012-11-18 DIAGNOSIS — M4310 Spondylolisthesis, site unspecified: Secondary | ICD-10-CM | POA: Diagnosis not present

## 2012-11-25 DIAGNOSIS — N952 Postmenopausal atrophic vaginitis: Secondary | ICD-10-CM | POA: Diagnosis not present

## 2012-11-25 DIAGNOSIS — R6882 Decreased libido: Secondary | ICD-10-CM | POA: Diagnosis not present

## 2012-11-25 DIAGNOSIS — N950 Postmenopausal bleeding: Secondary | ICD-10-CM | POA: Diagnosis not present

## 2012-11-30 DIAGNOSIS — Z23 Encounter for immunization: Secondary | ICD-10-CM | POA: Diagnosis not present

## 2012-11-30 DIAGNOSIS — Z0000 Encounter for general adult medical examination without abnormal findings: Secondary | ICD-10-CM | POA: Diagnosis not present

## 2012-12-10 DIAGNOSIS — N751 Abscess of Bartholin's gland: Secondary | ICD-10-CM | POA: Diagnosis not present

## 2012-12-13 ENCOUNTER — Ambulatory Visit: Payer: Medicare Other | Admitting: Family Medicine

## 2012-12-13 DIAGNOSIS — M47817 Spondylosis without myelopathy or radiculopathy, lumbosacral region: Secondary | ICD-10-CM | POA: Diagnosis not present

## 2012-12-13 DIAGNOSIS — M4310 Spondylolisthesis, site unspecified: Secondary | ICD-10-CM | POA: Diagnosis not present

## 2012-12-13 DIAGNOSIS — M76899 Other specified enthesopathies of unspecified lower limb, excluding foot: Secondary | ICD-10-CM | POA: Diagnosis not present

## 2012-12-14 ENCOUNTER — Ambulatory Visit (INDEPENDENT_AMBULATORY_CARE_PROVIDER_SITE_OTHER): Payer: Medicare Other | Admitting: Family Medicine

## 2012-12-14 ENCOUNTER — Encounter: Payer: Self-pay | Admitting: Family Medicine

## 2012-12-14 VITALS — BP 142/79 | HR 73 | Temp 98.9°F | Wt 163.0 lb

## 2012-12-14 DIAGNOSIS — E039 Hypothyroidism, unspecified: Secondary | ICD-10-CM | POA: Diagnosis not present

## 2012-12-14 DIAGNOSIS — R1310 Dysphagia, unspecified: Secondary | ICD-10-CM | POA: Insufficient documentation

## 2012-12-14 LAB — TSH: TSH: 5.324 u[IU]/mL — ABNORMAL HIGH (ref 0.350–4.500)

## 2012-12-14 NOTE — Patient Instructions (Signed)
Amanda Kim,  Thank you for coming in today.  We will be in touch with TSH results.  Avoid extremely cold or hot foods and spicy foods.   Please call us if symptoms increase or worsening. The next step in evaluation would be a swallow study.  Dr. Armen Pickup

## 2012-12-14 NOTE — Progress Notes (Signed)
Subjective:     Patient ID: Amanda Kim, female   DOB: 07/05/1945, 67 y.o.   MRN: [id number]  HPI Patient presents with odynophagia described as "spasms of the esophagus". Sensation started on Friday evening and occurs every time she eats or drinks. Occurs with both solids and liquids but worse with dry solids. Sensation has improved today and only occurred once during lunch. Former smoker, quit May 2013. Drinks alcohol socially. Only recent change has been starting 4d abx course on Friday d/t Bartholin gland abscess. Does not know which abx.   Antibiotic was clindamycin (populated into EPIC). Patient also gave history of similar symptoms in the past that were positional (lying) and resolved in < 30 minutes.   Denies regurgitation, chest pain, burning sensation, SOB, weight loss, fever. Reports significant recent life stressors, particular financial.  Review of Systems Per HPI    Objective:   Physical Exam General: well appearing female in NAD, alert and cooperative HEENT: Oropharynx: MMM. Clear. Small tonsillar stone noted on R tonsil.  Cardio: RRR, no murmur noted Lungs: CTAB, no wheezes or rales  BP 142/79  Pulse 73  Temp(Src) 98.9 F (37.2 C) (Oral)  Wt 163 lb (73.936 kg)  BMI 27.97 kg/m2 General appearance: alert, cooperative and no distress Neck: no adenopathy, no carotid bruit, no JVD, supple, symmetrical, trachea midline and thyroid not enlarged, symmetric, no tenderness/mass/nodules Lungs: clear to auscultation bilaterally Heart: regular rate and rhythm, S1, S2 normal, no murmur, click, rub or gallop Abdomen: soft, non-tender; bowel sounds normal; no masses,  no organomegaly  Lab Results  Component Value Date   TSH 5.324* 12/14/2012        Assessment:   Etiology unclear but broad differential includes motility disorder, infection, hypothyroidism, malignancy, psychosomatic etiology. Given improving course, will r/o hypothyroidism but further evaluation  unwarranted at this time.   Plan:  1. Odynophagia -TSH to r/o hypothyroidism -Continue to monitor -F/u if symptoms worsen and consider swallow study at that time  Attending Addendum  I examined the patient and discussed the assessment and plan with student Dr. Maurine Cane. I have reviewed the note and agree. I have added by documentation in bold print. My assessment and plan is in the problem list.     Dessa Phi, MD FAMILY MEDICINE TEACHING SERVICE

## 2012-12-15 ENCOUNTER — Telehealth: Payer: Self-pay | Admitting: Family Medicine

## 2012-12-15 MED ORDER — LEVOTHYROXINE SODIUM 125 MCG PO TABS: 125.0000 ug | ORAL_TABLET | Freq: Every day | ORAL | Status: DC

## 2012-12-15 NOTE — Telephone Encounter (Signed)
Pt is aware.   ,CMA  

## 2012-12-15 NOTE — Telephone Encounter (Signed)
Elevated TSH. She reported compliance with synthroid, so dose increased to 125 mcg q AM before breakfast PCP f/u and repeat TSH needed in 8 week.

## 2012-12-15 NOTE — Assessment & Plan Note (Signed)
A: Elevated TSH. Partially treated hypothyroidism could result in GI dysmotility.  She reported compliance with synthroid.  P: so dose increased to 125 mcg q AM before breakfast PCP f/u and repeat TSH needed in 8 week.

## 2012-12-15 NOTE — Assessment & Plan Note (Addendum)
A: new problem. Differentials included hypothyroidism, anxiety, esophageal web/diverticulum and esophageal malignancy. P: Reassurance no s/s to suggest malignancy.  TSH elevated, increased synthroid Patient provided with relaxation techniques Advised pcp f/u prn and in 8 weeks for repeat TSH.  If symptoms persist or worsen recommend swallow eval vs EGD depending on severity of symptoms.  Consider adding PPI if patient complains of pain

## 2012-12-23 ENCOUNTER — Other Ambulatory Visit: Payer: Self-pay

## 2012-12-27 DIAGNOSIS — M76899 Other specified enthesopathies of unspecified lower limb, excluding foot: Secondary | ICD-10-CM | POA: Diagnosis not present

## 2012-12-28 ENCOUNTER — Encounter: Payer: Self-pay | Admitting: Family Medicine

## 2013-01-04 ENCOUNTER — Ambulatory Visit (INDEPENDENT_AMBULATORY_CARE_PROVIDER_SITE_OTHER): Payer: Medicare Other | Admitting: Family Medicine

## 2013-01-04 ENCOUNTER — Encounter: Payer: Self-pay | Admitting: Family Medicine

## 2013-01-04 VITALS — BP 148/78 | HR 60 | Temp 98.4°F | Wt 162.4 lb

## 2013-01-04 DIAGNOSIS — F411 Generalized anxiety disorder: Secondary | ICD-10-CM | POA: Diagnosis not present

## 2013-01-04 DIAGNOSIS — F419 Anxiety disorder, unspecified: Secondary | ICD-10-CM

## 2013-01-04 MED ORDER — ALPRAZOLAM 0.5 MG PO TABS: 0.2500 mg | ORAL_TABLET | Freq: Every evening | ORAL | Status: DC | PRN

## 2013-01-04 NOTE — Assessment & Plan Note (Addendum)
GAD 7 - 19/21 PHQ9: 10/27  Pt under significatn stress and life changing events Xanax for 8 wks then change to cymbalta or other antidepressent if needed  Pt to seek counseling through [facility].  Pt to keep journal of triggers and relievers.  Spent >62min in direct pt care and counseling

## 2013-01-04 NOTE — Progress Notes (Signed)
Amanda Kim is a 67 y.o. female who presents to [facility] today for anxiety  Pt tearful today. 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 Pt very stressed. Anxiety adn depression and suicide run in family. Denies SI/HI, feelings of depression. Does not necessarily want medicine but would like some help. Excellent support system at home (outside of BF) of girlfriends and other family members. Feels like she is trying so hard but nothing is working. Uses Marijuana in the evenings with some relief.     The following portions of the patient's history were reviewed and updated as appropriate: allergies, current medications, past medical history, family and social history, and problem list.  Patient is a nonsmoker.   Past Medical History  Diagnosis Date  . Hyperlipidemia     takes Fish Oil and CoQ10  . Back pain     d/t horse fell on pt in the 80's  . Hypertension     takes Inderal daily  . Nausea & vomiting     phenergan and zofran prn  . Headache(784.0)     tension  . Bone spur     in neck(since 1990) and 1st 3 fingers on right hand go numb  . Spondylosis of thoracolumbar region without myelopathy or radiculopathy     spine  . Spondylolisthesis   . History of colon polyps   . Urinary frequency   . Leg cramps     takes Potassium prn  . Hypothyroid 05/07/2011    takes Synthroid daily  . Chronic pain     d/t horse accident in the 80's    ROS as above otherwise neg.    Medications reviewed. Current Outpatient Prescriptions  Medication Sig Dispense Refill  . ALPRAZolam (XANAX) 0.5 MG tablet Take 0.5 tablets (0.25 mg total) by mouth at bedtime as needed for anxiety.  30 tablet  0  . Coenzyme Q10-Red Yeast Rice 60-600 MG CAPS  Take 1 capsule by mouth daily. Combination includes fish oil as well.      . cyclobenzaprine (FLEXERIL) 5 MG tablet Take 1 tablet (5 mg total) by mouth 2 (two) times daily as needed for muscle spasms.  90 tablet  3  . diclofenac (VOLTAREN) 75 MG EC tablet       . fish oil-omega-3 fatty acids 1000 MG capsule Take 1 g by mouth daily.       Marland Kitchen HYDROcodone-acetaminophen (NORCO) 5-325 MG per tablet Take 1-2 tablets by mouth every 6 (six) hours as needed for pain.  30 tablet  1  . levothyroxine (SYNTHROID, LEVOTHROID) 125 MCG tablet Take 1 tablet (125 mcg total) by mouth daily before breakfast. Tier exception, dispense as written, generic  60 tablet  0  . oxyCODONE-acetaminophen (PERCOCET/ROXICET) 5-325 MG per tablet Take 1 tablet by mouth every 4 (four) hours as needed for pain.  30 tablet  0  . PREMARIN vaginal cream       . propranolol (INDERAL) 10 MG tablet Take 1 tablet (10 mg total) by mouth 2 (two) times daily.  180 tablet  3  . valACYclovir (VALTREX) 500 MG tablet       . vitamin E 600 UNIT capsule Take 2 Units by mouth daily.        No current facility-administered  medications for this visit.   Exam: BP 148/78  Pulse 60  Temp(Src) 98.4 F (36.9 C) (Oral)  Wt 162 lb 6.4 oz (73.664 kg)  Gen: Well NAD HEENT: EOMI,  MMM Lungs: CTABL Nl WOB Heart: RRR no MRG Abd: NABS, NT, ND Exts: Non edematous BL  LE, warm and well perfused.   No results found for this or any previous visit (from the past 72 hour(s)).

## 2013-01-04 NOTE — Patient Instructions (Signed)
You are under a lot of stress Please keep a journal of your triggers and what you are doing to combat those triggers and feelings Please only use the xanax sparingly. This will only be used for 8 weeks at the most Please come back to see me in 4 weeks Please bring in your blood pressure cuff.  Please contact the team at [facility]

## 2013-01-05 ENCOUNTER — Telehealth: Payer: Self-pay | Admitting: Family Medicine

## 2013-01-05 NOTE — Telephone Encounter (Signed)
[facility] [address]. She is leaving to go to [place] for 2 weeks. She will return Dec 3. Thought it would be good to have it in her system several weeks Before the holidays Please advise

## 2013-01-07 NOTE — Telephone Encounter (Signed)
In case she calls back. See my last phone note

## 2013-01-07 NOTE — Telephone Encounter (Signed)
Called and left a voicemail. Do not want to start antidepressant just before pt to leave country. This is due to possible side effects OK to do this upon return.  Shelly Flatten, MD Family Medicine PGY-3 01/07/2013, 3:25 PM

## 2013-01-20 ENCOUNTER — Telehealth: Payer: Self-pay | Admitting: Family Medicine

## 2013-01-20 ENCOUNTER — Other Ambulatory Visit: Payer: Self-pay | Admitting: Family Medicine

## 2013-01-20 DIAGNOSIS — F419 Anxiety disorder, unspecified: Secondary | ICD-10-CM

## 2013-01-20 NOTE — Telephone Encounter (Signed)
Pt called and is back from [place] and would like to start taking Cymbalta. She would [facility] [address] and [address] . jw

## 2013-01-21 MED ORDER — DULOXETINE HCL 20 MG PO CPEP: 20.0000 mg | ORAL_CAPSULE | Freq: Every day | ORAL | Status: DC

## 2013-01-21 NOTE — Telephone Encounter (Signed)
Sounds great. Thank you Shanda Bumps

## 2013-01-21 NOTE — Assessment & Plan Note (Signed)
Pt back in coutnry and would still like to try cymbalta Will send in Rx and have pt f/u in 2 wks Pt aware of risks /benefits

## 2013-01-21 NOTE — Telephone Encounter (Signed)
Pt informed.  She will ask mail order to hold Rx for now and ask that I [facility] [address].  Called in same Rx but with #0 RF. , Maryjo Rochester

## 2013-01-24 ENCOUNTER — Other Ambulatory Visit: Payer: Self-pay | Admitting: Family Medicine

## 2013-01-24 DIAGNOSIS — I10 Essential (primary) hypertension: Secondary | ICD-10-CM

## 2013-01-24 MED ORDER — PROPRANOLOL HCL 10 MG PO TABS: 10.0000 mg | ORAL_TABLET | Freq: Two times a day (BID) | ORAL | Status: DC

## 2013-01-25 ENCOUNTER — Other Ambulatory Visit: Payer: Self-pay | Admitting: Family Medicine

## 2013-02-02 ENCOUNTER — Telehealth: Payer: Self-pay | Admitting: Family Medicine

## 2013-02-02 ENCOUNTER — Ambulatory Visit (INDEPENDENT_AMBULATORY_CARE_PROVIDER_SITE_OTHER): Payer: Medicare Other | Admitting: Family Medicine

## 2013-02-02 ENCOUNTER — Encounter: Payer: Self-pay | Admitting: Family Medicine

## 2013-02-02 VITALS — BP 148/78 | HR 61 | Temp 99.5°F | Ht 64.0 in | Wt 161.0 lb

## 2013-02-02 DIAGNOSIS — I10 Essential (primary) hypertension: Secondary | ICD-10-CM

## 2013-02-02 DIAGNOSIS — M549 Dorsalgia, unspecified: Secondary | ICD-10-CM | POA: Diagnosis not present

## 2013-02-02 DIAGNOSIS — F419 Anxiety disorder, unspecified: Secondary | ICD-10-CM

## 2013-02-02 DIAGNOSIS — F3289 Other specified depressive episodes: Secondary | ICD-10-CM

## 2013-02-02 DIAGNOSIS — F329 Major depressive disorder, single episode, unspecified: Secondary | ICD-10-CM | POA: Diagnosis not present

## 2013-02-02 DIAGNOSIS — F411 Generalized anxiety disorder: Secondary | ICD-10-CM | POA: Diagnosis not present

## 2013-02-02 MED ORDER — ALPRAZOLAM 0.5 MG PO TABS: 0.2500 mg | ORAL_TABLET | Freq: Every evening | ORAL | Status: DC | PRN

## 2013-02-02 MED ORDER — DULOXETINE HCL 60 MG PO CPEP: 60.0000 mg | ORAL_CAPSULE | Freq: Every day | ORAL | Status: DC

## 2013-02-02 NOTE — Addendum Note (Signed)
Addended by: Konrad Dolores,  J on: 02/02/2013 12:07 PM   Modules accepted: Orders

## 2013-02-02 NOTE — Assessment & Plan Note (Signed)
At goal no chagne

## 2013-02-02 NOTE — Assessment & Plan Note (Addendum)
GAD 7 today of 3.  Refill xanax for 1 more month than no further refills Pt to work on further stress reducing techniques Inc cymbalta to 60mg  Qday Pt aware of no further xanax refills other than possible occasional PRN

## 2013-02-02 NOTE — Patient Instructions (Signed)
Thank you for coming in today Please increase your cymbalta to 60mg  daily Please start to wean off of your Xanax Please increase your flexeril to 10mg  as needed for spasm Please come back in 4 weeks

## 2013-02-02 NOTE — Assessment & Plan Note (Signed)
Well controlled w/  Vicodin 3x monthly Flexeril. May increase to 10mg  if needed PRN

## 2013-02-02 NOTE — Progress Notes (Signed)
Amanda Kim is a 67 y.o. female who presents to [facility] today for depression and anxiety  Depression: Cymbalta. Started 2 wks ago. Feels less emotional. Not crying. Walking more and getting out doors. Feeling more hopeful. Xanax increased to 1 per night w/ improvement in sleep. Also spending time w/ friends/ family and journaling and spending time getting ready for Christmas to help deal w/ anxiety. Tries to change thoughts when becomes anxious. Just got a dog. Has increased stress but also released stress.   Bug bites: onset after going to [place] a couple weeks ago. Legs and arms. puritic but no pain. Resolving. Denies fevers, yellowing of eyes. Pt UTD on all immunizations prior to trip  Back pain: chronic condition. Seen by SM and given insoles and exercises. No relief from PT. Flexeril w/ some benefit. Taking vicodin about 3x monthly.   The following portions of the patient's history were reviewed and updated as appropriate: allergies, current medications, past medical history, family and social history, and problem list.  Patient is a nonsmoker  Past Medical History  Diagnosis Date  . Hyperlipidemia     takes Fish Oil and CoQ10  . Back pain     d/t horse fell on pt in the 80's  . Hypertension     takes Inderal daily  . Nausea & vomiting     phenergan and zofran prn  . Headache(784.0)     tension  . Bone spur     in neck(since 1990) and 1st 3 fingers on right hand go numb  . Spondylosis of thoracolumbar region without myelopathy or radiculopathy     spine  . Spondylolisthesis   . History of colon polyps   . Urinary frequency   . Leg cramps     takes Potassium prn  . Hypothyroid 05/07/2011    takes Synthroid daily  . Chronic pain     d/t horse accident in the 80's    ROS as above otherwise neg.    Medications reviewed. Current Outpatient Prescriptions  Medication Sig Dispense Refill  . ALPRAZolam (XANAX) 0.5 MG tablet Take 0.5 tablets (0.25 mg total) by mouth at bedtime  as needed for anxiety.  30 tablet  0  . cyclobenzaprine (FLEXERIL) 5 MG tablet Take 1 tablet (5 mg total) by mouth 2 (two) times daily as needed for muscle spasms.  90 tablet  3  . diclofenac (VOLTAREN) 75 MG EC tablet       . Coenzyme Q10-Red Yeast Rice 60-600 MG CAPS Take 1 capsule by mouth daily. Combination includes fish oil as well.      . DULoxetine (CYMBALTA) 60 MG capsule Take 1 capsule (60 mg total) by mouth daily.  30 capsule  3  . fish oil-omega-3 fatty acids 1000 MG capsule Take 1 g by mouth daily.       Marland Kitchen PREMARIN vaginal cream       . propranolol (INDERAL) 10 MG tablet Take 1 tablet (10 mg total) by mouth 2 (two) times daily.  180 tablet  3  . SYNTHROID 125 MCG tablet TAKE 1 TABLET DAILY BEFORE BREAKFAST  60 tablet  0  . valACYclovir (VALTREX) 500 MG tablet       . vitamin E 600 UNIT capsule Take 2 Units by mouth daily.        No current facility-administered medications for this visit.    Exam:  BP 148/78  Pulse 61  Temp(Src) 99.5 F (37.5 C) (Oral)  Ht 5\' 4"  (1.626 m)  Wt 161 lb (73.029 kg)  BMI 27.62 kg/m2 Gen: Well NAD HEENT: EOMI,  MMM   No results found for this or any previous visit (from the past 72 hour(s)).  A/P (as seen in Problem list)  Essential hypertension, benign At goal no chagne  Back pain Well controlled w/  Vicodin 3x monthly Flexeril. May increase to 10mg  if needed PRN  Acute anxiety GAD 7 today of 3.  Refill xanax for 1 more month than no further refills Pt to work on further stress reducing techniques Inc cymbalta to 60mg  Qday Pt aware of no further xanax refills other than possible occasional PRN  Depression Battling depression for several months. Much improved on Cymbalta Inc dose to 60mg  daily.     Spent >25 min in direct pt care

## 2013-02-02 NOTE — Telephone Encounter (Signed)
Xanax instructions were supposed to be changed from 1/2 capsule daily to 1 capsule daily. Please send in correct rx to pharmacy

## 2013-02-02 NOTE — Assessment & Plan Note (Signed)
Battling depression for several months. Much improved on Cymbalta Inc dose to 60mg  daily.

## 2013-02-03 ENCOUNTER — Ambulatory Visit: Payer: Medicare Other | Admitting: Family Medicine

## 2013-02-03 DIAGNOSIS — M950 Acquired deformity of nose: Secondary | ICD-10-CM | POA: Diagnosis not present

## 2013-02-03 DIAGNOSIS — H02839 Dermatochalasis of unspecified eye, unspecified eyelid: Secondary | ICD-10-CM | POA: Diagnosis not present

## 2013-02-03 DIAGNOSIS — Z85828 Personal history of other malignant neoplasm of skin: Secondary | ICD-10-CM | POA: Diagnosis not present

## 2013-02-03 DIAGNOSIS — H02409 Unspecified ptosis of unspecified eyelid: Secondary | ICD-10-CM | POA: Diagnosis not present

## 2013-02-03 MED ORDER — ALPRAZOLAM 0.5 MG PO TABS: 0.2500 mg | ORAL_TABLET | Freq: Every evening | ORAL | Status: DC | PRN

## 2013-02-03 NOTE — Telephone Encounter (Signed)
Called in Xanax 0.5mg , take 1/2 to 1 tab QHS PRN sleep/anxiety Disp 45  This is an increased amount/dose from previous. No further refills  Pt to bring in old Rx to be destroyed  Shelly Flatten, MD Family Medicine PGY-3 02/03/2013, 12:54 PM

## 2013-02-03 NOTE — Telephone Encounter (Signed)
LM for pt that rx was called in and we need her old rx to shred.  ,CMA

## 2013-02-27 ENCOUNTER — Other Ambulatory Visit: Payer: Self-pay | Admitting: Family Medicine

## 2013-03-03 ENCOUNTER — Ambulatory Visit (INDEPENDENT_AMBULATORY_CARE_PROVIDER_SITE_OTHER): Payer: Medicare Other | Admitting: Family Medicine

## 2013-03-03 ENCOUNTER — Ambulatory Visit: Payer: Medicare Other | Admitting: Family Medicine

## 2013-03-03 ENCOUNTER — Encounter: Payer: Self-pay | Admitting: Family Medicine

## 2013-03-03 VITALS — BP 143/82 | HR 65 | Temp 98.6°F | Ht 64.0 in | Wt 163.0 lb

## 2013-03-03 DIAGNOSIS — I10 Essential (primary) hypertension: Secondary | ICD-10-CM

## 2013-03-03 DIAGNOSIS — F411 Generalized anxiety disorder: Secondary | ICD-10-CM | POA: Diagnosis not present

## 2013-03-03 DIAGNOSIS — F329 Major depressive disorder, single episode, unspecified: Secondary | ICD-10-CM

## 2013-03-03 DIAGNOSIS — F3289 Other specified depressive episodes: Secondary | ICD-10-CM | POA: Diagnosis not present

## 2013-03-03 DIAGNOSIS — F32A Depression, unspecified: Secondary | ICD-10-CM

## 2013-03-03 DIAGNOSIS — G8929 Other chronic pain: Secondary | ICD-10-CM

## 2013-03-03 DIAGNOSIS — G4700 Insomnia, unspecified: Secondary | ICD-10-CM | POA: Diagnosis not present

## 2013-03-03 DIAGNOSIS — F419 Anxiety disorder, unspecified: Secondary | ICD-10-CM

## 2013-03-03 MED ORDER — TRAZODONE HCL 50 MG PO TABS: 25.0000 mg | ORAL_TABLET | Freq: Every evening | ORAL | Status: DC | PRN

## 2013-03-03 MED ORDER — ALPRAZOLAM 0.5 MG PO TABS: 0.2500 mg | ORAL_TABLET | Freq: Every evening | ORAL | Status: DC | PRN

## 2013-03-03 NOTE — Assessment & Plan Note (Signed)
Pt making sincere effort to wean off.  Starting trazodone as only using at night for sleep now. Pt given only 20 xanax, no further refills. Pt aware

## 2013-03-03 NOTE — Assessment & Plan Note (Signed)
Improving overall on cymbalta 60.  No negative symptoms Pt happier today and w/ excellent goals moving forward to continue w/ her overall happiness.  No change

## 2013-03-03 NOTE — Progress Notes (Signed)
Amanda Kim is a 68 y.o. female who presents to [facility] today for f/u back pain, adn depression, and HTN  Back pain: much improved after increasing to cymbalta 60mg . No longer taking vicodin. Flexeril 10mg  QHS. Now exercising regularly at the gym.   Depression: much improved since increasing to cymbalta 60mg . Pt purchased dog adn loves the new pet. Keeping more busy. 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 journaling and focusing on what makes pt happy and reuniting w/ old friends.   HTN: continues to take propranolol. Denies CP, HA, palpitations, syncope  The following portions of the patient's history were reviewed and updated as appropriate: allergies, current medications, past medical history, family and social history, and problem list.  Patient is a former smoker.   Past Medical History  Diagnosis Date  . Hyperlipidemia     takes Fish Oil and CoQ10  . Back pain     d/t horse fell on pt in the 80's  . Hypertension     takes Inderal daily  . Nausea & vomiting     phenergan and zofran prn  . Headache(784.0)     tension  . Bone spur     in neck(since 1990) and 1st 3 fingers on right hand go numb  . Spondylosis of thoracolumbar region without myelopathy or radiculopathy     spine  . Spondylolisthesis   . History of colon polyps   . Urinary frequency   . Leg cramps     takes Potassium prn  . Hypothyroid 05/07/2011    takes Synthroid daily  . Chronic pain     d/t horse accident in the 80's    ROS as above otherwise neg.    Medications reviewed. Current Outpatient Prescriptions  Medication Sig Dispense Refill  . ALPRAZolam (XANAX) 0.5 MG tablet Take 0.5-1 tablets (0.25-0.5 mg total) by mouth at bedtime as needed for anxiety.  20 tablet  0  . cyclobenzaprine (FLEXERIL) 5 MG tablet Take 1 tablet (5 mg total) by mouth 2 (two) times daily as needed for muscle spasms.  90 tablet  3  . diclofenac (VOLTAREN) 75 MG EC tablet       . DULoxetine (CYMBALTA) 60 MG capsule Take 1 capsule (60 mg  total) by mouth daily.  30 capsule  3  . propranolol (INDERAL) 10 MG tablet Take 1 tablet (10 mg total) by mouth 2 (two) times daily.  180 tablet  3  . Coenzyme Q10-Red Yeast Rice 60-600 MG CAPS Take 1 capsule by mouth daily. Combination includes fish oil as well.      . fish oil-omega-3 fatty acids 1000 MG capsule Take 1 g by mouth daily.       Marland Kitchen PREMARIN vaginal cream       . SYNTHROID 125 MCG tablet TAKE 1 TABLET DAILY BEFORE BREAKFAST  60 tablet  0  . traZODone (DESYREL) 50 MG tablet Take 0.5-1 tablets (25-50 mg total) by mouth at bedtime as needed for sleep.  30 tablet  3  . valACYclovir (VALTREX) 500 MG tablet       . vitamin E 600 UNIT capsule Take 2 Units by mouth daily.        No current facility-administered medications for this visit.    Exam:  BP 143/82  Pulse 65  Temp(Src) 98.6 F (37 C) (Oral)  Ht 5\' 4"  (1.626 m)  Wt 163 lb (73.936 kg)  BMI 27.97 kg/m2 Gen: Well NAD HEENT: EOMI,  MMM  No results found for this or any  previous visit (from the past 72 hour(s)).  A/P (as seen in Problem list)  Essential hypertension, benign At goal Continue propranolol  Insomnia Primarily anxiety induced Pt weaning off xanax Trazodone PRN   Depression Improving overall on cymbalta 60.  No negative symptoms Pt happier today and w/ excellent goals moving forward to continue w/ her overall happiness.  No change  Chronic pain No vicodin use since last appt  Likely benefitting from weaning off opioids adn starting cymbalta and exercising. Continue flexeril. OK for vicodin PRN but would favor coming off to tramodol for future refills  Acute anxiety Pt making sincere effort to wean off.  Starting trazodone as only using at night for sleep now. Pt given only 20 xanax, no further refills. Pt aware

## 2013-03-03 NOTE — Assessment & Plan Note (Signed)
No vicodin use since last appt  Likely benefitting from weaning off opioids adn starting cymbalta and exercising. Continue flexeril. OK for vicodin PRN but would favor coming off to tramodol for future refills

## 2013-03-03 NOTE — Assessment & Plan Note (Signed)
At goal Continue propranolol

## 2013-03-03 NOTE — Assessment & Plan Note (Signed)
Primarily anxiety induced Pt weaning off xanax Trazodone PRN

## 2013-03-03 NOTE — Patient Instructions (Signed)
You are doing great overall. Please continue to wean off the xanax.  Start using trazodone as the primary medication for your nightime symptoms. You are doing great on the cymbalta. Continue your regular exercise routine Please continue all of your other medications as prescribed.  Have a great day.

## 2013-03-10 ENCOUNTER — Other Ambulatory Visit: Payer: Self-pay | Admitting: Dermatology

## 2013-03-10 DIAGNOSIS — L820 Inflamed seborrheic keratosis: Secondary | ICD-10-CM | POA: Diagnosis not present

## 2013-03-10 DIAGNOSIS — Z85828 Personal history of other malignant neoplasm of skin: Secondary | ICD-10-CM | POA: Diagnosis not present

## 2013-03-10 DIAGNOSIS — L719 Rosacea, unspecified: Secondary | ICD-10-CM | POA: Diagnosis not present

## 2013-03-10 DIAGNOSIS — D239 Other benign neoplasm of skin, unspecified: Secondary | ICD-10-CM | POA: Diagnosis not present

## 2013-03-10 DIAGNOSIS — D485 Neoplasm of uncertain behavior of skin: Secondary | ICD-10-CM | POA: Diagnosis not present

## 2013-03-10 DIAGNOSIS — L821 Other seborrheic keratosis: Secondary | ICD-10-CM | POA: Diagnosis not present

## 2013-03-23 MED ORDER — LEVOTHYROXINE SODIUM 125 MCG PO TABS: ORAL_TABLET | ORAL | Status: DC

## 2013-04-22 DIAGNOSIS — Z23 Encounter for immunization: Secondary | ICD-10-CM | POA: Diagnosis not present

## 2013-04-29 ENCOUNTER — Telehealth: Payer: Self-pay | Admitting: *Deleted

## 2013-04-29 NOTE — Telephone Encounter (Signed)
Called pt re: flu shot. .,   

## 2013-05-13 DIAGNOSIS — Z5189 Encounter for other specified aftercare: Secondary | ICD-10-CM | POA: Diagnosis not present

## 2013-05-13 DIAGNOSIS — Z85828 Personal history of other malignant neoplasm of skin: Secondary | ICD-10-CM | POA: Diagnosis not present

## 2013-05-23 ENCOUNTER — Telehealth: Payer: Self-pay | Admitting: Family Medicine

## 2013-05-23 NOTE — Telephone Encounter (Signed)
Refilled cymbalta 60mg  daily 30 tabs, 3 refills. Sent to [facility]   Liam Graham, PGY-3 Family Medicine Resident

## 2013-05-24 ENCOUNTER — Other Ambulatory Visit: Payer: Self-pay | Admitting: *Deleted

## 2013-05-24 DIAGNOSIS — F329 Major depressive disorder, single episode, unspecified: Secondary | ICD-10-CM

## 2013-05-24 DIAGNOSIS — F32A Depression, unspecified: Secondary | ICD-10-CM

## 2013-05-24 MED ORDER — DULOXETINE HCL 60 MG PO CPEP: 60.0000 mg | ORAL_CAPSULE | Freq: Every day | ORAL | Status: DC

## 2013-06-10 DIAGNOSIS — M950 Acquired deformity of nose: Secondary | ICD-10-CM | POA: Diagnosis not present

## 2013-06-10 DIAGNOSIS — Z85828 Personal history of other malignant neoplasm of skin: Secondary | ICD-10-CM | POA: Diagnosis not present

## 2013-06-10 DIAGNOSIS — H02839 Dermatochalasis of unspecified eye, unspecified eyelid: Secondary | ICD-10-CM | POA: Diagnosis not present

## 2013-06-16 ENCOUNTER — Encounter: Payer: Self-pay | Admitting: Family Medicine

## 2013-06-16 ENCOUNTER — Ambulatory Visit (INDEPENDENT_AMBULATORY_CARE_PROVIDER_SITE_OTHER): Payer: Medicare Other | Admitting: Family Medicine

## 2013-06-16 VITALS — BP 153/84 | HR 67 | Temp 99.6°F | Ht 64.0 in | Wt 157.0 lb

## 2013-06-16 DIAGNOSIS — F329 Major depressive disorder, single episode, unspecified: Secondary | ICD-10-CM

## 2013-06-16 DIAGNOSIS — I10 Essential (primary) hypertension: Secondary | ICD-10-CM

## 2013-06-16 DIAGNOSIS — F32A Depression, unspecified: Secondary | ICD-10-CM

## 2013-06-16 DIAGNOSIS — F3289 Other specified depressive episodes: Secondary | ICD-10-CM | POA: Diagnosis not present

## 2013-06-16 MED ORDER — DULOXETINE HCL 20 MG PO CPEP: ORAL_CAPSULE | ORAL | Status: DC

## 2013-06-16 NOTE — Patient Instructions (Signed)
You are doing well overall.  I think coming off the cymbalta is a good choice at this point in time Please follow the taper recommendations and come back to see me in 8 weeks or sooner if needed.

## 2013-06-16 NOTE — Progress Notes (Signed)
Amanda Kim is a 68 y.o. female who presents to [facility] today for f'u/  Depression: Feels well. 3/10 overall depression. Going to therapist w/ benefit. Taking Cymbalta. Would like to DC at this time.   Tobacco: still smoking 1/2ppd trying to quit. No CP, SOB.   HTN: taking meds. No CP, paplpiatations.   The following portions of the patient's history were reviewed and updated as appropriate: allergies, current medications, past medical history, family and social history, and problem list.  Patient is a smoker.   Past Medical History  Diagnosis Date  . Hyperlipidemia     takes Fish Oil and CoQ10  . Back pain     d/t horse fell on pt in the 80's  . Hypertension     takes Inderal daily  . Nausea & vomiting     phenergan and zofran prn  . Headache(784.0)     tension  . Bone spur     in neck(since 1990) and 1st 3 fingers on right hand go numb  . Spondylosis of thoracolumbar region without myelopathy or radiculopathy     spine  . Spondylolisthesis   . History of colon polyps   . Urinary frequency   . Leg cramps     takes Potassium prn  . Hypothyroid 05/07/2011    takes Synthroid daily  . Chronic pain     d/t horse accident in the 80's    ROS as above otherwise neg.    Medications reviewed. Current Outpatient Prescriptions  Medication Sig Dispense Refill  . ALPRAZolam (XANAX) 0.5 MG tablet Take 0.5-1 tablets (0.25-0.5 mg total) by mouth at bedtime as needed for anxiety.  20 tablet  0  . Coenzyme Q10-Red Yeast Rice 60-600 MG CAPS Take 1 capsule by mouth daily. Combination includes fish oil as well.      . cyclobenzaprine (FLEXERIL) 5 MG tablet Take 1 tablet (5 mg total) by mouth 2 (two) times daily as needed for muscle spasms.  90 tablet  3  . diclofenac (VOLTAREN) 75 MG EC tablet       . DULoxetine (CYMBALTA) 20 MG capsule Take 40mg  daily for 4 weeks then 20mg  for 4 weeks  60 capsule  3  . fish oil-omega-3 fatty acids 1000 MG capsule Take 1 g by mouth daily.       Marland Kitchen  levothyroxine (SYNTHROID) 125 MCG tablet TAKE 1 TABLET DAILY BEFORE BREAKFAST  90 tablet  3  . PREMARIN vaginal cream       . propranolol (INDERAL) 10 MG tablet Take 1 tablet (10 mg total) by mouth 2 (two) times daily.  180 tablet  3  . traZODone (DESYREL) 50 MG tablet Take 0.5-1 tablets (25-50 mg total) by mouth at bedtime as needed for sleep.  30 tablet  3  . valACYclovir (VALTREX) 500 MG tablet       . vitamin E 600 UNIT capsule Take 2 Units by mouth daily.        No current facility-administered medications for this visit.    Exam:  BP 153/84  Pulse 67  Temp(Src) 99.6 F (37.6 C) (Oral)  Ht 5\' 4"  (1.626 m)  Wt 157 lb (71.215 kg)  BMI 26.94 kg/m2 Gen: Well NAD HEENT: EOMI,  MMM   No results found for this or any previous visit (from the past 70 hour(s)).  A/P (as seen in Problem list)  Essential hypertension, benign Elevated today above goal. May be stress related No change at this time but may need  to change bblocker at this time bblocker also for stress/tremor w/ public speaking  Depression Pt improved significanlty over the past 12+ mo.  Starting cymbalta wean (40mg  x4 wks then 20mg  x 4 wks)  Pt aware of reasons to go back to increased dose.    Spent >25 min in direct pt care and counseling

## 2013-06-17 ENCOUNTER — Encounter: Payer: Self-pay | Admitting: Family Medicine

## 2013-06-20 NOTE — Assessment & Plan Note (Signed)
Elevated today above goal. May be stress related No change at this time but may need to change bblocker at this time bblocker also for stress/tremor w/ public speaking

## 2013-06-20 NOTE — Assessment & Plan Note (Addendum)
Pt improved significanlty over the past 12+ mo.  Starting cymbalta wean (40mg  x4 wks then 20mg  x 4 wks)  Pt aware of reasons to go back to increased dose.

## 2013-07-04 DIAGNOSIS — IMO0002 Reserved for concepts with insufficient information to code with codable children: Secondary | ICD-10-CM | POA: Diagnosis not present

## 2013-07-04 DIAGNOSIS — M47817 Spondylosis without myelopathy or radiculopathy, lumbosacral region: Secondary | ICD-10-CM | POA: Diagnosis not present

## 2013-07-04 DIAGNOSIS — M4310 Spondylolisthesis, site unspecified: Secondary | ICD-10-CM | POA: Diagnosis not present

## 2013-07-06 DIAGNOSIS — M4310 Spondylolisthesis, site unspecified: Secondary | ICD-10-CM | POA: Diagnosis not present

## 2013-07-06 DIAGNOSIS — M47817 Spondylosis without myelopathy or radiculopathy, lumbosacral region: Secondary | ICD-10-CM | POA: Diagnosis not present

## 2013-07-06 DIAGNOSIS — IMO0002 Reserved for concepts with insufficient information to code with codable children: Secondary | ICD-10-CM | POA: Diagnosis not present

## 2013-07-21 DIAGNOSIS — M47817 Spondylosis without myelopathy or radiculopathy, lumbosacral region: Secondary | ICD-10-CM | POA: Diagnosis not present

## 2013-07-21 DIAGNOSIS — M4310 Spondylolisthesis, site unspecified: Secondary | ICD-10-CM | POA: Diagnosis not present

## 2013-07-21 DIAGNOSIS — IMO0002 Reserved for concepts with insufficient information to code with codable children: Secondary | ICD-10-CM | POA: Diagnosis not present

## 2013-07-21 DIAGNOSIS — E039 Hypothyroidism, unspecified: Secondary | ICD-10-CM | POA: Diagnosis not present

## 2013-07-21 DIAGNOSIS — N390 Urinary tract infection, site not specified: Secondary | ICD-10-CM | POA: Diagnosis not present

## 2013-07-21 DIAGNOSIS — R5381 Other malaise: Secondary | ICD-10-CM | POA: Diagnosis not present

## 2013-08-03 DIAGNOSIS — K7689 Other specified diseases of liver: Secondary | ICD-10-CM | POA: Diagnosis not present

## 2013-08-03 DIAGNOSIS — R7301 Impaired fasting glucose: Secondary | ICD-10-CM | POA: Diagnosis not present

## 2013-08-03 DIAGNOSIS — E039 Hypothyroidism, unspecified: Secondary | ICD-10-CM | POA: Diagnosis not present

## 2013-08-03 DIAGNOSIS — M1991 Primary osteoarthritis, unspecified site: Secondary | ICD-10-CM | POA: Diagnosis not present

## 2013-08-10 ENCOUNTER — Other Ambulatory Visit: Payer: Self-pay | Admitting: Family Medicine

## 2013-08-12 ENCOUNTER — Encounter: Payer: Self-pay | Admitting: *Deleted

## 2013-08-12 NOTE — Progress Notes (Signed)
[facility]ization received from [facility] for [facility].  PA form placed in provider box for completion. Derl Barrow, RN

## 2013-08-15 NOTE — Progress Notes (Signed)
PA for flexeril faxed to [employer] for review.  Derl Barrow, RN   Received PA approval for Flexeril 5mg  via [employer].  Med approved for 05/17/2013 - 08/15/2014.  [facility] informed.   Hassell Done, Rosine Beat, RN

## 2013-08-25 DIAGNOSIS — R5383 Other fatigue: Secondary | ICD-10-CM | POA: Diagnosis not present

## 2013-08-25 DIAGNOSIS — R5381 Other malaise: Secondary | ICD-10-CM | POA: Diagnosis not present

## 2013-08-26 DIAGNOSIS — M950 Acquired deformity of nose: Secondary | ICD-10-CM | POA: Diagnosis not present

## 2013-08-31 ENCOUNTER — Ambulatory Visit (INDEPENDENT_AMBULATORY_CARE_PROVIDER_SITE_OTHER): Payer: Medicare Other | Admitting: Family Medicine

## 2013-08-31 ENCOUNTER — Encounter: Payer: Self-pay | Admitting: Family Medicine

## 2013-08-31 VITALS — BP 159/76 | HR 56 | Temp 98.7°F | Ht 64.0 in | Wt 160.6 lb

## 2013-08-31 DIAGNOSIS — F329 Major depressive disorder, single episode, unspecified: Secondary | ICD-10-CM | POA: Diagnosis not present

## 2013-08-31 DIAGNOSIS — B009 Herpesviral infection, unspecified: Secondary | ICD-10-CM | POA: Diagnosis not present

## 2013-08-31 DIAGNOSIS — F32A Depression, unspecified: Secondary | ICD-10-CM

## 2013-08-31 DIAGNOSIS — G8929 Other chronic pain: Secondary | ICD-10-CM

## 2013-08-31 DIAGNOSIS — N898 Other specified noninflammatory disorders of vagina: Secondary | ICD-10-CM

## 2013-08-31 DIAGNOSIS — F3289 Other specified depressive episodes: Secondary | ICD-10-CM

## 2013-08-31 DIAGNOSIS — B001 Herpesviral vesicular dermatitis: Secondary | ICD-10-CM

## 2013-08-31 MED ORDER — OXYCODONE-ACETAMINOPHEN 5-325 MG PO TABS: 1.0000 | ORAL_TABLET | ORAL | Status: DC | PRN

## 2013-08-31 MED ORDER — VALACYCLOVIR HCL 500 MG PO TABS: 2000.0000 mg | ORAL_TABLET | Freq: Every day | ORAL | Status: DC

## 2013-08-31 NOTE — Assessment & Plan Note (Signed)
A: likely cold sore exacerbated by sun exposure P; rx valtrex 2gm X1 day Pt to place moisturizer on lips Sent groin region for hsv culture but suspect folliculitis

## 2013-08-31 NOTE — Patient Instructions (Signed)
Amanda Kim it was great to see you today!  I am pleased to hear that things are going well for you. Please take the valtrex 4 pills once a day  I will call you if the results of your cultures are positive If you don't hear from me everything is fine!  Looking forward to seeing you soon Amanda Bell, MD

## 2013-08-31 NOTE — Assessment & Plan Note (Signed)
Pt completely off medication  Had recently seen an internal med doc? Is unclear whether she will be going to them or [place] Either way, psychologically stable

## 2013-08-31 NOTE — Assessment & Plan Note (Signed)
Refilled 30 of yearly medicine per previous Dr. Marily Memos instructions

## 2013-08-31 NOTE — Progress Notes (Signed)
Patient ID: Amanda Kim, female   DOB: 03-13-1945, 68 y.o.   MRN: [id number]   [facility] Bernadene Bell, MD Phone: [phone]  Subjective:  Amanda Kim is a 68 y.o F who presents for routine f/up  #depression -has been weaned off cymbalta as well as anxiolytics over the past several months -has had very weird side effects during this process including hearing her eyeballs squeaking in her head -feels completely fine now that she has been on medication   #blisters -noted that they appeared on lip about 1 day ago, and one on groin crease -has been out of valtrex (took 2 that she had) and 2 1500mg  lisene pills  -was out in the sun for prolonged period of time without sunscreen recently -additionally denies sexual intercourse or recent physical contact with infected herpetic genitalia -has been sitting in wet bathing suits for past few days  #chronic pain -in contract with Dr. Marily Memos for 64 percs a year  All systems were reviewed and were negative unless otherwise noted in the HPI  Past Medical History Patient Active Problem List   Diagnosis Date Noted  . Insomnia 03/03/2013  . Depression 02/02/2013  . Acute anxiety 01/04/2013  . Odynophagia 12/14/2012  . Essential hypertension, benign 05/06/2012  . Gallstones 04/13/2012  . Back pain 01/13/2012  . Weight gain 09/05/2011  . Hyperlipidemia 09/05/2011  . Hypothyroid 05/07/2011  . Chronic pain 05/07/2011   Reviewed problem list.  Medications- reviewed and updated Chief complaint-noted No additions to family history Social history- patient is a former smoker  Objective: BP 159/76  Pulse 56  Temp(Src) 98.7 F (37.1 C) (Oral)  Ht 5\' 4"  (1.626 m)  Wt 160 lb 9.6 oz (72.848 kg)  BMI 27.55 kg/m2 Gen: NAD, alert, cooperative with exam HEENT: NCAT, EOMI, PERRL, cold sore on bottom lip, not draining no ulceration  Neck: FROM, supple GU: pustular, follicular lesion on inguinal region; unroofed and  sent for culture Neuro: Alert and oriented, No gross deficits Skin: no rashes no lesions apart from above  Assessment/Plan: See problem based a/p

## 2013-09-02 LAB — HERPES SIMPLEX VIRUS CULTURE: ORGANISM ID, BACTERIA: NOT DETECTED

## 2013-09-05 ENCOUNTER — Encounter: Payer: Self-pay | Admitting: Family Medicine

## 2013-09-05 DIAGNOSIS — M542 Cervicalgia: Secondary | ICD-10-CM | POA: Diagnosis not present

## 2013-09-05 DIAGNOSIS — M25549 Pain in joints of unspecified hand: Secondary | ICD-10-CM | POA: Diagnosis not present

## 2013-09-13 DIAGNOSIS — M47817 Spondylosis without myelopathy or radiculopathy, lumbosacral region: Secondary | ICD-10-CM | POA: Diagnosis not present

## 2013-09-22 DIAGNOSIS — M47817 Spondylosis without myelopathy or radiculopathy, lumbosacral region: Secondary | ICD-10-CM | POA: Diagnosis not present

## 2013-09-23 DIAGNOSIS — L905 Scar conditions and fibrosis of skin: Secondary | ICD-10-CM | POA: Diagnosis not present

## 2013-09-23 DIAGNOSIS — Z85828 Personal history of other malignant neoplasm of skin: Secondary | ICD-10-CM | POA: Diagnosis not present

## 2013-09-23 DIAGNOSIS — M950 Acquired deformity of nose: Secondary | ICD-10-CM | POA: Diagnosis not present

## 2013-09-27 DIAGNOSIS — M47817 Spondylosis without myelopathy or radiculopathy, lumbosacral region: Secondary | ICD-10-CM | POA: Diagnosis not present

## 2013-09-28 DIAGNOSIS — R5383 Other fatigue: Secondary | ICD-10-CM | POA: Diagnosis not present

## 2013-09-28 DIAGNOSIS — K7689 Other specified diseases of liver: Secondary | ICD-10-CM | POA: Diagnosis not present

## 2013-09-28 DIAGNOSIS — R5381 Other malaise: Secondary | ICD-10-CM | POA: Diagnosis not present

## 2013-09-28 DIAGNOSIS — E039 Hypothyroidism, unspecified: Secondary | ICD-10-CM | POA: Diagnosis not present

## 2013-09-28 DIAGNOSIS — R7301 Impaired fasting glucose: Secondary | ICD-10-CM | POA: Diagnosis not present

## 2013-09-30 DIAGNOSIS — M47817 Spondylosis without myelopathy or radiculopathy, lumbosacral region: Secondary | ICD-10-CM | POA: Diagnosis not present

## 2013-10-03 DIAGNOSIS — M47817 Spondylosis without myelopathy or radiculopathy, lumbosacral region: Secondary | ICD-10-CM | POA: Diagnosis not present

## 2013-10-05 DIAGNOSIS — R7301 Impaired fasting glucose: Secondary | ICD-10-CM | POA: Diagnosis not present

## 2013-10-05 DIAGNOSIS — E039 Hypothyroidism, unspecified: Secondary | ICD-10-CM | POA: Diagnosis not present

## 2013-10-05 DIAGNOSIS — M1991 Primary osteoarthritis, unspecified site: Secondary | ICD-10-CM | POA: Diagnosis not present

## 2013-10-05 DIAGNOSIS — K7689 Other specified diseases of liver: Secondary | ICD-10-CM | POA: Diagnosis not present

## 2013-10-06 DIAGNOSIS — M47817 Spondylosis without myelopathy or radiculopathy, lumbosacral region: Secondary | ICD-10-CM | POA: Diagnosis not present

## 2013-10-10 ENCOUNTER — Telehealth: Payer: Self-pay | Admitting: *Deleted

## 2013-10-10 DIAGNOSIS — G5600 Carpal tunnel syndrome, unspecified upper limb: Secondary | ICD-10-CM | POA: Diagnosis not present

## 2013-10-10 NOTE — Telephone Encounter (Signed)
Am worried about her taking too much mobic for GI effects. (although better than some) Would hold on refill. Unclear where org prescription came from? Unless she is having acute exacerbation that needs to be seen in our clinic. [facility], MD

## 2013-10-11 DIAGNOSIS — M47817 Spondylosis without myelopathy or radiculopathy, lumbosacral region: Secondary | ICD-10-CM | POA: Diagnosis not present

## 2013-10-11 MED ORDER — GABAPENTIN 300 MG PO CAPS: 300.0000 mg | ORAL_CAPSULE | Freq: Every day | ORAL | Status: DC

## 2013-10-11 MED ORDER — TRAMADOL HCL 50 MG PO TABS: 50.0000 mg | ORAL_TABLET | Freq: Three times a day (TID) | ORAL | Status: DC | PRN

## 2013-10-11 NOTE — Telephone Encounter (Signed)
Pt stated she has chronic back since 1987 and Mobic was prescribed by Dr. Marily Memos.  She only uses this once a day.  Derl Barrow, RN

## 2013-10-11 NOTE — Addendum Note (Signed)
Addended by: Langston Masker C on: 10/11/2013 04:02 PM   Modules accepted: Orders

## 2013-10-11 NOTE — Telephone Encounter (Signed)
Spoke with pt regarding MD message for pt to come into clinic to discuss medication and back pain.  Pt stated she has been on gabapentin 100 mg TID, but really prefer something once a day.  Pt does not want anything to hard to take like oxycodone.  Please give pt a call.  Derl Barrow, RN

## 2013-10-11 NOTE — Telephone Encounter (Signed)
Patient informed that Dr. Skeet Simmer suggest increasing dosage of gabapentin 300 mg once a day or will call in an Rx for Tramadol.  Pt opt for Rx for Tramadol and made an appointment with Dr. Skeet Simmer on 11/04/2013 at 3:45 PM to further discuss medication and chronic back pain.  Derl Barrow, RN

## 2013-10-11 NOTE — Telephone Encounter (Signed)
Personally I am very concerned for her to use this daily given new studies on CV effects with chronic NSAIDs. Would ideally like her to be seen in the office for chronic back pain and discuss options re tramadol vs gabapentin etc. [facility], MD

## 2013-10-13 DIAGNOSIS — M47817 Spondylosis without myelopathy or radiculopathy, lumbosacral region: Secondary | ICD-10-CM | POA: Diagnosis not present

## 2013-10-17 NOTE — Telephone Encounter (Signed)
Pt states she still needs clarification on which pain meds she is to be taking. Pt states she received RX for bother Tramadol and Gabapentin. Pls advise.

## 2013-10-18 NOTE — Telephone Encounter (Signed)
Left voice message for pt stating she should be taking the Tramadol until her appointment on 11/04/2013 at 3:45 PM with Dr. Skeet Simmer.  Derl Barrow, RN

## 2013-10-26 DIAGNOSIS — M47817 Spondylosis without myelopathy or radiculopathy, lumbosacral region: Secondary | ICD-10-CM | POA: Diagnosis not present

## 2013-10-27 DIAGNOSIS — M48061 Spinal stenosis, lumbar region without neurogenic claudication: Secondary | ICD-10-CM | POA: Diagnosis not present

## 2013-10-27 DIAGNOSIS — M47817 Spondylosis without myelopathy or radiculopathy, lumbosacral region: Secondary | ICD-10-CM | POA: Diagnosis not present

## 2013-10-28 DIAGNOSIS — M47817 Spondylosis without myelopathy or radiculopathy, lumbosacral region: Secondary | ICD-10-CM | POA: Diagnosis not present

## 2013-11-04 ENCOUNTER — Ambulatory Visit (INDEPENDENT_AMBULATORY_CARE_PROVIDER_SITE_OTHER): Payer: Medicare Other | Admitting: Family Medicine

## 2013-11-04 ENCOUNTER — Encounter: Payer: Self-pay | Admitting: Family Medicine

## 2013-11-04 VITALS — BP 128/77 | HR 71 | Temp 98.7°F | Wt 155.0 lb

## 2013-11-04 DIAGNOSIS — G8929 Other chronic pain: Secondary | ICD-10-CM | POA: Diagnosis not present

## 2013-11-04 DIAGNOSIS — Z23 Encounter for immunization: Secondary | ICD-10-CM | POA: Diagnosis not present

## 2013-11-04 MED ORDER — TRAMADOL HCL 50 MG PO TABS: 50.0000 mg | ORAL_TABLET | Freq: Three times a day (TID) | ORAL | Status: DC | PRN

## 2013-11-04 MED ORDER — CYCLOBENZAPRINE HCL 5 MG PO TABS: ORAL_TABLET | ORAL | Status: DC

## 2013-11-04 NOTE — Patient Instructions (Signed)
Ms Michna it was great to see you today!  For your bone pain- please take tramadol daily as needed For your muscle spasms- please try flexeril to help For your nerve pain- please take gabapentin nightly  Ok to take these medications together  Looking forward to seeing you soon Bernadene Bell, MD

## 2013-11-04 NOTE — Progress Notes (Signed)
Patient ID: Amanda Kim, female   DOB: Aug 05, 1945, 68 y.o.   MRN: [id number]   [facility] Bernadene Bell, MD Phone: [phone]  Subjective:  Ms Hashman is a 68 y.o F who presents today for f/up on LBP  # chronic back pain -Pt [place] it to [place]also getting epidural injections? -started her on tramadol; consolidated gabapentin to 300 once a day -also refilled 30pills of norco per Dr. Baker Janus previous -feels somewhat improved but had some questions regarding regimen  All relevant systems were reviewed and were negative unless otherwise noted in the HPI  Past Medical History Reviewed problem list.  Medications- reviewed and updated Current Outpatient Prescriptions  Medication Sig Dispense Refill  . ALPRAZolam (XANAX) 0.5 MG tablet Take 0.5-1 tablets (0.25-0.5 mg total) by mouth at bedtime as needed for anxiety.  20 tablet  0  . Coenzyme Q10-Red Yeast Rice 60-600 MG CAPS Take 1 capsule by mouth daily. Combination includes fish oil as well.      . cyclobenzaprine (FLEXERIL) 5 MG tablet TAKE 1 TABLET TWICE A DAY  AS NEEDED FOR MUSCLE       SPASMS  60 tablet  5  . diclofenac (VOLTAREN) 75 MG EC tablet       . DULoxetine (CYMBALTA) 20 MG capsule Take 40mg  daily for 4 weeks then 20mg  for 4 weeks  60 capsule  3  . fish oil-omega-3 fatty acids 1000 MG capsule Take 1 g by mouth daily.       Marland Kitchen gabapentin (NEURONTIN) 300 MG capsule Take 1 capsule (300 mg total) by mouth at bedtime.  90 capsule  3  . levothyroxine (SYNTHROID) 125 MCG tablet TAKE 1 TABLET DAILY BEFORE BREAKFAST  90 tablet  3  . oxyCODONE-acetaminophen (PERCOCET/ROXICET) 5-325 MG per tablet Take 1 tablet by mouth every 4 (four) hours as needed for severe pain.  30 tablet  0  . PREMARIN vaginal cream       . propranolol (INDERAL) 10 MG tablet Take 1 tablet (10 mg total) by mouth 2 (two)  times daily.  180 tablet  3  . traMADol (ULTRAM) 50 MG tablet Take 1 tablet (50 mg total) by mouth every 8 (eight) hours as needed.  30 tablet  0  . traZODone (DESYREL) 50 MG tablet Take 0.5-1 tablets (25-50 mg total) by mouth at bedtime as needed for sleep.  30 tablet  3  . valACYclovir (VALTREX) 500 MG tablet Take 4 tablets (2,000 mg total) by mouth daily.  4 tablet  3  . vitamin E 600 UNIT capsule Take 2 Units by mouth daily.        No current facility-administered medications for this visit.   Chief complaint-noted No additions to family history Social history- patient is a former smoker  Objective: BP 128/77  Pulse 71  Temp(Src) 98.7 F (37.1 C) (Oral)  Wt 155 lb (70.308 kg) Gen: NAD, alert, cooperative with exam HEENT: NCAT, EOMI Neck: FROM, supple Neuro: Alert and oriented, No gross deficits Skin: no rashes no lesions  Assessment/Plan: See problem based a/p

## 2013-11-04 NOTE — Assessment & Plan Note (Signed)
Chronic but improved Will tx with 3 pronged therapy 1. Gabapentin for nerve pain 2. Flexeril for mm spasm 3. Tramadol for bone pain Cont to titrate these as tolerated No other changes in regimen

## 2013-11-07 DIAGNOSIS — G5600 Carpal tunnel syndrome, unspecified upper limb: Secondary | ICD-10-CM | POA: Diagnosis not present

## 2013-11-07 DIAGNOSIS — M48061 Spinal stenosis, lumbar region without neurogenic claudication: Secondary | ICD-10-CM | POA: Diagnosis not present

## 2013-11-07 DIAGNOSIS — M47817 Spondylosis without myelopathy or radiculopathy, lumbosacral region: Secondary | ICD-10-CM | POA: Diagnosis not present

## 2013-11-07 DIAGNOSIS — M542 Cervicalgia: Secondary | ICD-10-CM | POA: Diagnosis not present

## 2013-11-08 DIAGNOSIS — M47817 Spondylosis without myelopathy or radiculopathy, lumbosacral region: Secondary | ICD-10-CM | POA: Diagnosis not present

## 2013-11-22 DIAGNOSIS — M950 Acquired deformity of nose: Secondary | ICD-10-CM | POA: Diagnosis not present

## 2013-11-22 DIAGNOSIS — Z85828 Personal history of other malignant neoplasm of skin: Secondary | ICD-10-CM | POA: Diagnosis not present

## 2013-12-05 DIAGNOSIS — M542 Cervicalgia: Secondary | ICD-10-CM | POA: Diagnosis not present

## 2013-12-06 ENCOUNTER — Encounter: Payer: Self-pay | Admitting: Family Medicine

## 2013-12-06 ENCOUNTER — Ambulatory Visit (INDEPENDENT_AMBULATORY_CARE_PROVIDER_SITE_OTHER): Payer: Medicare Other | Admitting: Family Medicine

## 2013-12-06 VITALS — BP 155/69 | HR 61 | Temp 98.1°F | Resp 98 | Wt 155.0 lb

## 2013-12-06 DIAGNOSIS — R300 Dysuria: Secondary | ICD-10-CM

## 2013-12-06 DIAGNOSIS — M950 Acquired deformity of nose: Secondary | ICD-10-CM | POA: Diagnosis not present

## 2013-12-06 DIAGNOSIS — N390 Urinary tract infection, site not specified: Secondary | ICD-10-CM | POA: Diagnosis not present

## 2013-12-06 DIAGNOSIS — L905 Scar conditions and fibrosis of skin: Secondary | ICD-10-CM | POA: Diagnosis not present

## 2013-12-06 DIAGNOSIS — Z85828 Personal history of other malignant neoplasm of skin: Secondary | ICD-10-CM | POA: Diagnosis not present

## 2013-12-06 LAB — POCT URINALYSIS DIPSTICK
BILIRUBIN UA: NEGATIVE
GLUCOSE UA: NEGATIVE
KETONES UA: NEGATIVE
Nitrite, UA: POSITIVE
Protein, UA: NEGATIVE
Spec Grav, UA: 1.01
Urobilinogen, UA: 0.2
pH, UA: 7

## 2013-12-06 LAB — POCT UA - MICROSCOPIC ONLY: WBC, Ur, HPF, POC: >20

## 2013-12-06 MED ORDER — SULFAMETHOXAZOLE-TMP DS 800-160 MG PO TABS: 1.0000 | ORAL_TABLET | Freq: Two times a day (BID) | ORAL | Status: DC

## 2013-12-06 NOTE — Patient Instructions (Addendum)
Thank you for coming in,   You were found to have a UTI on your urinalysis. We will treat you with antibiotics.    Please feel free to call with any questions or concerns at any time, at [phone]. --Dr. Raeford Razor  Urinary Tract Infection Urinary tract infections (UTIs) can develop anywhere along your urinary tract. Your urinary tract is your body's drainage system for removing wastes and extra water. Your urinary tract includes two kidneys, two ureters, a bladder, and a urethra. Your kidneys are a pair of bean-shaped organs. Each kidney is about the size of your fist. They are located below your ribs, one on each side of your spine. CAUSES Infections are caused by microbes, which are microscopic organisms, including fungi, viruses, and bacteria. These organisms are so small that they can only be seen through a microscope. Bacteria are the microbes that most commonly cause UTIs. SYMPTOMS  Symptoms of UTIs may vary by age and gender of the patient and by the location of the infection. Symptoms in young women typically include a frequent and intense urge to urinate and a painful, burning feeling in the bladder or urethra during urination. Older women and men are more likely to be tired, shaky, and weak and have muscle aches and abdominal pain. A fever may mean the infection is in your kidneys. Other symptoms of a kidney infection include pain in your back or sides below the ribs, nausea, and vomiting. DIAGNOSIS To diagnose a UTI, your caregiver will ask you about your symptoms. Your caregiver also will ask to provide a urine sample. The urine sample will be tested for bacteria and white blood cells. White blood cells are made by your body to help fight infection. TREATMENT  Typically, UTIs can be treated with medication. Because most UTIs are caused by a bacterial infection, they usually can be treated with the use of antibiotics. The choice of antibiotic and length of treatment depend on your symptoms  and the type of bacteria causing your infection. HOME CARE INSTRUCTIONS  If you were prescribed antibiotics, take them exactly as your caregiver instructs you. Finish the medication even if you feel better after you have only taken some of the medication.  Drink enough water and fluids to keep your urine clear or pale yellow.  Avoid caffeine, tea, and carbonated beverages. They tend to irritate your bladder.  Empty your bladder often. Avoid holding urine for long periods of time.  Empty your bladder before and after sexual intercourse.  After a bowel movement, women should cleanse from front to back. Use each tissue only once. SEEK MEDICAL CARE IF:   You have back pain.  You develop a fever.  Your symptoms do not begin to resolve within 3 days. SEEK IMMEDIATE MEDICAL CARE IF:   You have severe back pain or lower abdominal pain.  You develop chills.  You have nausea or vomiting.  You have continued burning or discomfort with urination. MAKE SURE YOU:   Understand these instructions.  Will watch your condition.  Will get help right away if you are not doing well or get worse. Document Released: 11/13/2004 Document Revised: 08/05/2011 Document Reviewed: 03/14/2011 [facility] Patient Information 2015 [place], [place]. This information is not intended to replace advice given to you by your health care provider. Make sure you discuss any questions you have with your health care provider.

## 2013-12-06 NOTE — Progress Notes (Signed)
     Subjective   Amanda Kim is a 68 y.o. female that presents for a same day visit  1. dysuria: having pain with urination, increased frequency and urine having some odor and appearing cloudy. The started about a week ago. Denies any vaginal discharge or itching. Denies any sexual contacts. Denies any fevers, nausea, vomiting, chills, night sweats or new back pain. She has a history of UTI when she was in her 39's but not since.   History  Substance Use Topics  . Smoking status: Former Smoker -- 0.80 packs/day for 45 years    Types: Cigarettes  . Smokeless tobacco: Never Used     Comment: Quit smoking on 07/17/2011  . Alcohol Use: Yes     Comment: red wine couple of times a week    ROS Per HPI  Objective   BP 155/69  Pulse 61  Temp(Src) 98.1 F (36.7 C) (Oral)  Resp 98  Wt 155 lb (70.308 kg)  SpO2 98%  General: well appearing, NAD, alert  Cardiovascular: RRR, S1S2,  Gastrointestinal: soft, NTND, +BS Back: No CVA tenderess   Extremities: moves all freely  Neuro: no gross deficits.   Assessment and Plan   Please refer to problem based charting of assessment and plan

## 2013-12-06 NOTE — Assessment & Plan Note (Signed)
Symptoms most suggestive of UTI. No systemic features to suggest pyelonephritis. No new sexual encounters to suggest STI.  - UA: pos nitrites, small leuks  - urine cx  - Bactrim BID x 3 days.

## 2013-12-08 ENCOUNTER — Other Ambulatory Visit: Payer: Self-pay | Admitting: *Deleted

## 2013-12-08 DIAGNOSIS — I10 Essential (primary) hypertension: Secondary | ICD-10-CM

## 2013-12-08 MED ORDER — PROPRANOLOL HCL 10 MG PO TABS: 10.0000 mg | ORAL_TABLET | Freq: Two times a day (BID) | ORAL | Status: DC

## 2013-12-09 DIAGNOSIS — M47812 Spondylosis without myelopathy or radiculopathy, cervical region: Secondary | ICD-10-CM | POA: Diagnosis not present

## 2014-01-17 DIAGNOSIS — E039 Hypothyroidism, unspecified: Secondary | ICD-10-CM | POA: Diagnosis not present

## 2014-01-17 DIAGNOSIS — E160 Drug-induced hypoglycemia without coma: Secondary | ICD-10-CM | POA: Diagnosis not present

## 2014-01-17 DIAGNOSIS — R7301 Impaired fasting glucose: Secondary | ICD-10-CM | POA: Diagnosis not present

## 2014-01-17 DIAGNOSIS — K7581 Nonalcoholic steatohepatitis (NASH): Secondary | ICD-10-CM | POA: Diagnosis not present

## 2014-02-01 DIAGNOSIS — M545 Low back pain: Secondary | ICD-10-CM | POA: Diagnosis not present

## 2014-02-01 DIAGNOSIS — M47812 Spondylosis without myelopathy or radiculopathy, cervical region: Secondary | ICD-10-CM | POA: Diagnosis not present

## 2014-02-12 ENCOUNTER — Ambulatory Visit (HOSPITAL_COMMUNITY): Payer: Medicare Other | Attending: Family Medicine

## 2014-02-12 ENCOUNTER — Emergency Department (INDEPENDENT_AMBULATORY_CARE_PROVIDER_SITE_OTHER)
Admission: EM | Admit: 2014-02-12 | Discharge: 2014-02-12 | Disposition: A | Discharge status: Home / Self Care | Payer: Medicare Other | Source: Home / Self Care | Attending: Family Medicine | Admitting: Family Medicine

## 2014-02-12 ENCOUNTER — Encounter (HOSPITAL_COMMUNITY): Payer: Self-pay | Admitting: Emergency Medicine

## 2014-02-12 DIAGNOSIS — R0781 Pleurodynia: Secondary | ICD-10-CM | POA: Diagnosis not present

## 2014-02-12 DIAGNOSIS — R0789 Other chest pain: Secondary | ICD-10-CM | POA: Diagnosis not present

## 2014-02-12 DIAGNOSIS — M25512 Pain in left shoulder: Secondary | ICD-10-CM | POA: Diagnosis not present

## 2014-02-12 DIAGNOSIS — S29012A Strain of muscle and tendon of back wall of thorax, initial encounter: Secondary | ICD-10-CM

## 2014-02-12 MED ORDER — DICLOFENAC SODIUM 50 MG PO TBEC: 50.0000 mg | DELAYED_RELEASE_TABLET | Freq: Two times a day (BID) | ORAL | Status: DC | PRN

## 2014-02-12 MED ORDER — CYCLOBENZAPRINE HCL 5 MG PO TABS: 5.0000 mg | ORAL_TABLET | Freq: Every evening | ORAL | Status: DC | PRN

## 2014-02-12 NOTE — Discharge Instructions (Signed)
Thank you for coming in today. Attend physical therapy.  Come back as needed.  Follow-up with Dr. Anola Gurney sports medicine as needed   Back Exercises These exercises may help you when beginning to rehabilitate your injury. Your symptoms may resolve with or without further involvement from your physician, physical therapist or athletic trainer. While completing these exercises, remember:   Restoring tissue flexibility helps normal motion to return to the joints. This allows healthier, less painful movement and activity.  An effective stretch should be held for at least 30 seconds.  A stretch should never be painful. You should only feel a gentle lengthening or release in the stretched tissue. STRETCH - Extension, Prone on Elbows   Lie on your stomach on the floor, a bed will be too soft. Place your palms about shoulder width apart and at the height of your head.  Place your elbows under your shoulders. If this is too painful, stack pillows under your chest.  Allow your body to relax so that your hips drop lower and make contact more completely with the floor.  Hold this position for __________ seconds.  Slowly return to lying flat on the floor. Repeat __________ times. Complete this exercise __________ times per day.  RANGE OF MOTION - Extension, Prone Press Ups   Lie on your stomach on the floor, a bed will be too soft. Place your palms about shoulder width apart and at the height of your head.  Keeping your back as relaxed as possible, slowly straighten your elbows while keeping your hips on the floor. You may adjust the placement of your hands to maximize your comfort. As you gain motion, your hands will come more underneath your shoulders.  Hold this position __________ seconds.  Slowly return to lying flat on the floor. Repeat __________ times. Complete this exercise __________ times per day.  RANGE OF MOTION- Quadruped, Neutral Spine   Assume a hands and knees position  on a firm surface. Keep your hands under your shoulders and your knees under your hips. You may place padding under your knees for comfort.  Drop your head and point your tail bone toward the ground below you. This will round out your low back like an angry cat. Hold this position for __________ seconds.  Slowly lift your head and release your tail bone so that your back sags into a large arch, like an old horse.  Hold this position for __________ seconds.  Repeat this until you feel limber in your low back.  Now, find your "sweet spot." This will be the most comfortable position somewhere between the two previous positions. This is your neutral spine. Once you have found this position, tense your stomach muscles to support your low back.  Hold this position for __________ seconds. Repeat __________ times. Complete this exercise __________ times per day.  STRETCH - Flexion, Single Knee to Chest   Lie on a firm bed or floor with both legs extended in front of you.  Keeping one leg in contact with the floor, bring your opposite knee to your chest. Hold your leg in place by either grabbing behind your thigh or at your knee.  Pull until you feel a gentle stretch in your low back. Hold __________ seconds.  Slowly release your grasp and repeat the exercise with the opposite side. Repeat __________ times. Complete this exercise __________ times per day.  STRETCH - Hamstrings, Standing  Stand or sit and extend your right / left leg, placing your foot  on a chair or foot stool  Keeping a slight arch in your low back and your hips straight forward.  Lead with your chest and lean forward at the waist until you feel a gentle stretch in the back of your right / left knee or thigh. (When done correctly, this exercise requires leaning only a small distance.)  Hold this position for __________ seconds. Repeat __________ times. Complete this stretch __________ times per day. STRENGTHENING - Deep  Abdominals, Pelvic Tilt   Lie on a firm bed or floor. Keeping your legs in front of you, bend your knees so they are both pointed toward the ceiling and your feet are flat on the floor.  Tense your lower abdominal muscles to press your low back into the floor. This motion will rotate your pelvis so that your tail bone is scooping upwards rather than pointing at your feet or into the floor.  With a gentle tension and even breathing, hold this position for __________ seconds. Repeat __________ times. Complete this exercise __________ times per day.  STRENGTHENING - Abdominals, Crunches   Lie on a firm bed or floor. Keeping your legs in front of you, bend your knees so they are both pointed toward the ceiling and your feet are flat on the floor. Cross your arms over your chest.  Slightly tip your chin down without bending your neck.  Tense your abdominals and slowly lift your trunk high enough to just clear your shoulder blades. Lifting higher can put excessive stress on the low back and does not further strengthen your abdominal muscles.  Control your return to the starting position. Repeat __________ times. Complete this exercise __________ times per day.  STRENGTHENING - Quadruped, Opposite UE/LE Lift   Assume a hands and knees position on a firm surface. Keep your hands under your shoulders and your knees under your hips. You may place padding under your knees for comfort.  Find your neutral spine and gently tense your abdominal muscles so that you can maintain this position. Your shoulders and hips should form a rectangle that is parallel with the floor and is not twisted.  Keeping your trunk steady, lift your right hand no higher than your shoulder and then your left leg no higher than your hip. Make sure you are not holding your breath. Hold this position __________ seconds.  Continuing to keep your abdominal muscles tense and your back steady, slowly return to your starting position.  Repeat with the opposite arm and leg. Repeat __________ times. Complete this exercise __________ times per day. Document Released: 02/21/2005 Document Revised: 04/28/2011 Document Reviewed: 05/18/2008 [facility] Patient Information 2015 [place], [place]. This information is not intended to replace advice given to you by your health care provider. Make sure you discuss any questions you have with your health care provider.

## 2014-02-12 NOTE — ED Provider Notes (Signed)
Amanda Kim is a 68 y.o. female who presents to Urgent Care today for left posterior thoracic back pain present for 3-4 weeks. Pain is worse with activity and better with rest. The pain has woken her from sleep several times. Pain is worse with deep inspiration and motion of her shoulders. She has tried over-the-counter medications and muscle relaxers and massage which have not helped. No fevers or chills vomiting or diarrhea.   Past Medical History  Diagnosis Date  . Hyperlipidemia     takes Fish Oil and CoQ10  . Back pain     d/t horse fell on pt in the 80's  . Hypertension     takes Inderal daily  . Nausea & vomiting     phenergan and zofran prn  . Headache(784.0)     tension  . Bone spur     in neck(since 1990) and 1st 3 fingers on right hand go numb  . Spondylosis of thoracolumbar region without myelopathy or radiculopathy     spine  . Spondylolisthesis   . History of colon polyps   . Urinary frequency   . Leg cramps     takes Potassium prn  . Hypothyroid 05/07/2011    takes Synthroid daily  . Chronic pain     d/t horse accident in the 80's   Past Surgical History  Procedure Laterality Date  . Tubal ligation  1981  . Cesarean section  1967, 1970  . Colonoscopy    . Cholecystectomy N/A 05/20/2012    Procedure: LAPAROSCOPIC CHOLECYSTECTOMY WITH INTRAOPERATIVE CHOLANGIOGRAM;  Surgeon: Merrie Roof, MD;  Location: [facility];  Service: General;  Laterality: N/A;   History  Substance Use Topics  . Smoking status: Former Smoker -- 0.80 packs/day for 45 years    Types: Cigarettes  . Smokeless tobacco: Never Used     Comment: Quit smoking on 07/17/2011  . Alcohol Use: Yes     Comment: red wine couple of times a week   ROS as above Medications: No current facility-administered medications for this encounter.   Current Outpatient Prescriptions  Medication Sig Dispense Refill  . ALPRAZolam (XANAX) 0.5 MG tablet Take 0.5-1 tablets (0.25-0.5 mg total) by mouth at bedtime  as needed for anxiety. 20 tablet 0  . Coenzyme Q10-Red Yeast Rice 60-600 MG CAPS Take 1 capsule by mouth daily. Combination includes fish oil as well.    . cyclobenzaprine (FLEXERIL) 5 MG tablet TAKE 1 TABLET TWICE A DAY  AS NEEDED FOR MUSCLE SPASMS 60 tablet 5  . cyclobenzaprine (FLEXERIL) 5 MG tablet Take 1 tablet (5 mg total) by mouth at bedtime as needed for muscle spasms. 20 tablet 0  . diclofenac (VOLTAREN) 50 MG EC tablet Take 1 tablet (50 mg total) by mouth 2 (two) times daily as needed. 30 tablet 0  . DULoxetine (CYMBALTA) 20 MG capsule Take 40mg  daily for 4 weeks then 20mg  for 4 weeks 60 capsule 3  . fish oil-omega-3 fatty acids 1000 MG capsule Take 1 g by mouth daily.     Marland Kitchen gabapentin (NEURONTIN) 300 MG capsule Take 1 capsule (300 mg total) by mouth at bedtime. 90 capsule 3  . levothyroxine (SYNTHROID) 125 MCG tablet TAKE 1 TABLET DAILY BEFORE BREAKFAST 90 tablet 3  . metFORMIN (GLUCOPHAGE) 500 MG tablet Take 500 mg by mouth daily with breakfast.     . oxyCODONE-acetaminophen (PERCOCET/ROXICET) 5-325 MG per tablet Take 1 tablet by mouth every 4 (four) hours as needed for severe pain. [place]  tablet 0  . PREMARIN vaginal cream     . propranolol (INDERAL) 10 MG tablet Take 1 tablet (10 mg total) by mouth 2 (two) times daily. 180 tablet 3  . sulfamethoxazole-trimethoprim (BACTRIM DS) 800-160 MG per tablet Take 1 tablet by mouth 2 (two) times daily. 6 tablet 0  . traMADol (ULTRAM) 50 MG tablet Take 1 tablet (50 mg total) by mouth every 8 (eight) hours as needed. 30 tablet 3  . traZODone (DESYREL) 50 MG tablet Take 0.5-1 tablets (25-50 mg total) by mouth at bedtime as needed for sleep. 30 tablet 3  . valACYclovir (VALTREX) 500 MG tablet Take 4 tablets (2,000 mg total) by mouth daily. 4 tablet 3  . vitamin E 600 UNIT capsule Take 2 Units by mouth daily.      Allergies  Allergen Reactions  . Penicillins Itching and Swelling    No airway involvement.      Exam:  BP 138/66 mmHg  Pulse 74   Temp(Src) 99.6 F (37.6 C) (Oral)  Resp 18  SpO2 97% Gen: Well NAD HEENT: EOMI,  MMM Lungs: Normal work of breathing. CTABL Heart: RRR no MRG Abd: NABS, Soft. Nondistended, Nontender Exts: Brisk capillary refill, warm and well perfused.  MSK: Tender to palpation left rhombus area. Some pain with scapular motion. Scapular motion appeared to be symmetrical bilaterally.  No results found for this or any previous visit (from the past 24 hour(s)). Dg Chest 2 View  02/12/2014   CLINICAL DATA:  Pain inferior to the left scapula for 3 weeks.  EXAM: CHEST  2 VIEW  COMPARISON:  PA and lateral chest 05/18/2012.  FINDINGS: The lungs are clear. Heart size is normal. No pneumothorax or pleural effusion. No focal bony abnormality. Cholecystectomy clips are noted.  IMPRESSION: No acute disease.   Electronically Signed   By: Inge Rise M.D.   On: 02/12/2014 14:50    Assessment and Plan: 68 y.o. female with left-sided rhomboid muscle strain. Refer to physical therapy diclofenac Flexeril follow up with sports medicine as needed.  Discussed warning signs or symptoms. Please see discharge instructions. Patient expresses understanding.     Gregor Hams, MD 02/12/14 [phone]

## 2014-02-12 NOTE — ED Notes (Signed)
Pt states that her back has been hurting and possibly spasms since 01/21/2014 after she received a body massage.

## 2014-02-20 ENCOUNTER — Other Ambulatory Visit: Payer: Self-pay | Admitting: *Deleted

## 2014-02-20 DIAGNOSIS — M545 Low back pain: Secondary | ICD-10-CM | POA: Diagnosis not present

## 2014-02-20 DIAGNOSIS — M546 Pain in thoracic spine: Secondary | ICD-10-CM | POA: Diagnosis not present

## 2014-02-20 MED ORDER — CYCLOBENZAPRINE HCL 5 MG PO TABS: ORAL_TABLET | ORAL | Status: DC

## 2014-02-20 NOTE — Telephone Encounter (Signed)
Note to nursing staff - please call patient and inform her that one month supply of Flexeril provided, as Dr. Skeet Simmer is no longer with the practice will need to follow up with new PCP no get next refill

## 2014-02-21 NOTE — Telephone Encounter (Signed)
Pt informed.  She will call back to schedule. ,  Dawn  

## 2014-02-24 DIAGNOSIS — M546 Pain in thoracic spine: Secondary | ICD-10-CM | POA: Diagnosis not present

## 2014-02-24 DIAGNOSIS — M545 Low back pain: Secondary | ICD-10-CM | POA: Diagnosis not present

## 2014-02-27 DIAGNOSIS — M47817 Spondylosis without myelopathy or radiculopathy, lumbosacral region: Secondary | ICD-10-CM | POA: Diagnosis not present

## 2014-02-27 DIAGNOSIS — M797 Fibromyalgia: Secondary | ICD-10-CM | POA: Diagnosis not present

## 2014-02-27 DIAGNOSIS — M47812 Spondylosis without myelopathy or radiculopathy, cervical region: Secondary | ICD-10-CM | POA: Diagnosis not present

## 2014-02-28 DIAGNOSIS — M546 Pain in thoracic spine: Secondary | ICD-10-CM | POA: Diagnosis not present

## 2014-02-28 DIAGNOSIS — M545 Low back pain: Secondary | ICD-10-CM | POA: Diagnosis not present

## 2014-03-13 DIAGNOSIS — L821 Other seborrheic keratosis: Secondary | ICD-10-CM | POA: Diagnosis not present

## 2014-03-13 DIAGNOSIS — D225 Melanocytic nevi of trunk: Secondary | ICD-10-CM | POA: Diagnosis not present

## 2014-03-13 DIAGNOSIS — Z85828 Personal history of other malignant neoplasm of skin: Secondary | ICD-10-CM | POA: Diagnosis not present

## 2014-03-13 DIAGNOSIS — L719 Rosacea, unspecified: Secondary | ICD-10-CM | POA: Diagnosis not present

## 2014-03-20 ENCOUNTER — Other Ambulatory Visit: Payer: Self-pay | Admitting: Family Medicine

## 2014-03-21 ENCOUNTER — Encounter: Payer: Self-pay | Admitting: Family Medicine

## 2014-03-21 ENCOUNTER — Ambulatory Visit (INDEPENDENT_AMBULATORY_CARE_PROVIDER_SITE_OTHER): Payer: Medicare Other | Admitting: Family Medicine

## 2014-03-21 VITALS — BP 155/91 | HR 61 | Temp 99.0°F | Ht 64.0 in | Wt 141.0 lb

## 2014-03-21 DIAGNOSIS — M549 Dorsalgia, unspecified: Secondary | ICD-10-CM | POA: Diagnosis not present

## 2014-03-21 DIAGNOSIS — I10 Essential (primary) hypertension: Secondary | ICD-10-CM | POA: Diagnosis not present

## 2014-03-21 DIAGNOSIS — G8929 Other chronic pain: Secondary | ICD-10-CM

## 2014-03-21 DIAGNOSIS — Z1389 Encounter for screening for other disorder: Secondary | ICD-10-CM | POA: Diagnosis not present

## 2014-03-21 DIAGNOSIS — Z1331 Encounter for screening for depression: Secondary | ICD-10-CM | POA: Insufficient documentation

## 2014-03-21 MED ORDER — OXYCODONE-ACETAMINOPHEN 5-325 MG PO TABS: 1.0000 | ORAL_TABLET | ORAL | Status: DC | PRN

## 2014-03-21 NOTE — Assessment & Plan Note (Signed)
Record reviewed. No spine imaging on record. Uncertain the pathology behind her back pain.  I refilled her Percocet for few days. Plan to f/u with her new PCP for management of her back pain. Continue management as well with [facility].

## 2014-03-21 NOTE — Assessment & Plan Note (Signed)
BP slightly elevated today. Patient aysmptomatic. No changes made to her medication. F/U in 4 wks with her new PCP for reassessment.

## 2014-03-21 NOTE — Progress Notes (Signed)
Subjective:     Patient ID: Amanda Kim, female   DOB: 05-Feb-1946, 69 y.o.   MRN: [id number]  HPI  Back Pain: Here for follow up and refill of her Oxycodone, she has been having back 1987, uses percocet 3 - 4 times yearly, she gets epidural injection by Dr Konrad Felix at [facility] twice a year, last was done last month. It did help some. Pain started following an injury where a Horse fell on her. [id number] with all medication, here for follow up. Depression Screening: Denies any concern.  Current Outpatient Prescriptions on File Prior to Visit  Medication Sig Dispense Refill  . cyclobenzaprine (FLEXERIL) 5 MG tablet TAKE 1 TABLET TWICE A DAY  AS NEEDED FOR MUSCLE SPASMS 60 tablet 0  . levothyroxine (SYNTHROID) 125 MCG tablet TAKE 1 TABLET DAILY BEFORE BREAKFAST (Patient taking differently: Take 100 mcg by mouth. TAKE 1 TABLET DAILY BEFORE BREAKFAST) 90 tablet 3  . metFORMIN (GLUCOPHAGE) 500 MG tablet Take 500 mg by mouth daily with breakfast.     . propranolol (INDERAL) 10 MG tablet Take 1 tablet (10 mg total) by mouth 2 (two) times daily. 180 tablet 3  . ALPRAZolam (XANAX) 0.5 MG tablet Take 0.5-1 tablets (0.25-0.5 mg total) by mouth at bedtime as needed for anxiety. 20 tablet 0  . PREMARIN vaginal cream     . traMADol (ULTRAM) 50 MG tablet Take 1 tablet (50 mg total) by mouth every 8 (eight) hours as needed. (Patient not taking: Reported on 03/21/2014) 30 tablet 3  . traZODone (DESYREL) 50 MG tablet Take 0.5-1 tablets (25-50 mg total) by mouth at bedtime as needed for sleep. (Patient not taking: Reported on 03/21/2014) 30 tablet 3  . valACYclovir (VALTREX) 500 MG tablet Take 4 tablets (2,000 mg total) by mouth daily. (Patient not taking: Reported on 03/21/2014) 4 tablet 3  . vitamin E 600 UNIT capsule Take 2 Units by mouth daily.      No current facility-administered medications on file prior to visit.   Past Medical History  Diagnosis Date  . Hyperlipidemia     takes Fish Oil and  CoQ10  . Back pain     d/t horse fell on pt in the 80's  . Hypertension     takes Inderal daily  . Nausea & vomiting     phenergan and zofran prn  . Headache(784.0)     tension  . Bone spur     in neck(since 1990) and 1st 3 fingers on right hand go numb  . Spondylosis of thoracolumbar region without myelopathy or radiculopathy     spine  . Spondylolisthesis   . History of colon polyps   . Urinary frequency   . Leg cramps     takes Potassium prn  . Hypothyroid 05/07/2011    takes Synthroid daily  . Chronic pain     d/t horse accident in the 80's     Review of Systems  Respiratory: Negative.   Cardiovascular: Negative.   Gastrointestinal: Negative.   Musculoskeletal: Positive for back pain.  Psychiatric/Behavioral: The patient is not nervous/anxious.   All other systems reviewed and are negative.  Filed Vitals:   03/21/14 0919  BP: 155/91  Pulse: 61  Temp: 99 F (37.2 C)  TempSrc: Oral  Height: 5\' 4"  (1.626 m)  Weight: 141 lb (63.957 kg)       Objective:   Physical Exam  Constitutional: She appears well-developed. No distress.  Cardiovascular: Normal rate, regular rhythm and normal heart  sounds.   No murmur heard. Pulmonary/Chest: Effort normal and breath sounds normal. No respiratory distress. She has no wheezes.  Abdominal: Soft. Bowel sounds are normal. She exhibits no distension and no mass. There is no tenderness.  Musculoskeletal: Normal range of motion. She exhibits no edema or tenderness.       Lumbar back: She exhibits no tenderness and no swelling.       Back:  Nursing note and vitals reviewed.      Assessment:     Chronic back pain HTN Depression screening      Plan:     Check problem list.

## 2014-03-21 NOTE — Assessment & Plan Note (Signed)
PHQ 9 score of two. Neg Screening.

## 2014-03-21 NOTE — Patient Instructions (Signed)
It was nice seeing you. Please schedule follow up soon with your New PCP, likely Dr Lincoln Brigham     Back Pain, Adult Back pain is very common. The pain often gets better over time. The cause of back pain is usually not dangerous. Most people can learn to manage their back pain on their own.  HOME CARE   Stay active. Start with short walks on flat ground if you can. Try to walk farther each day.  Do not sit, drive, or stand in one place for more than 30 minutes. Do not stay in bed.  Do not avoid exercise or work. Activity can help your back heal faster.  Be careful when you bend or lift an object. Bend at your knees, keep the object close to you, and do not twist.  Sleep on a firm mattress. Lie on your side, and bend your knees. If you lie on your back, put a pillow under your knees.  Only take medicines as told by your doctor.  Put ice on the injured area.  Put ice in a plastic bag.  Place a towel between your skin and the bag.  Leave the ice on for 15-20 minutes, 03-04 times a day for the first 2 to 3 days. After that, you can switch between ice and heat packs.  Ask your doctor about back exercises or massage.  Avoid feeling anxious or stressed. Find good ways to deal with stress, such as exercise. GET HELP RIGHT AWAY IF:   Your pain does not go away with rest or medicine.  Your pain does not go away in 1 week.  You have new problems.  You do not feel well.  The pain spreads into your legs.  You cannot control when you poop (bowel movement) or pee (urinate).  Your arms or legs feel weak or lose feeling (numbness).  You feel sick to your stomach (nauseous) or throw up (vomit).  You have belly (abdominal) pain.  You feel like you may pass out (faint). MAKE SURE YOU:   Understand these instructions.  Will watch your condition.  Will get help right away if you are not doing well or get worse. Document Released: 07/23/2007 Document Revised: 04/28/2011 Document  Reviewed: 06/07/2013 [facility] Patient Information 2015 [place], [place]. This information is not intended to replace advice given to you by your health care provider. Make sure you discuss any questions you have with your health care provider.

## 2014-04-18 DIAGNOSIS — M950 Acquired deformity of nose: Secondary | ICD-10-CM | POA: Diagnosis not present

## 2014-04-18 DIAGNOSIS — Z85828 Personal history of other malignant neoplasm of skin: Secondary | ICD-10-CM | POA: Diagnosis not present

## 2014-04-26 ENCOUNTER — Telehealth: Payer: Self-pay | Admitting: Student

## 2014-04-26 ENCOUNTER — Other Ambulatory Visit: Payer: Self-pay | Admitting: Family Medicine

## 2014-04-26 NOTE — Telephone Encounter (Signed)
Needs antibotic for bladder infection [facility] [address]

## 2014-04-27 DIAGNOSIS — N390 Urinary tract infection, site not specified: Secondary | ICD-10-CM | POA: Diagnosis not present

## 2014-04-27 DIAGNOSIS — R350 Frequency of micturition: Secondary | ICD-10-CM | POA: Diagnosis not present

## 2014-05-12 DIAGNOSIS — Z85828 Personal history of other malignant neoplasm of skin: Secondary | ICD-10-CM | POA: Diagnosis not present

## 2014-05-12 DIAGNOSIS — M950 Acquired deformity of nose: Secondary | ICD-10-CM | POA: Diagnosis not present

## 2014-05-15 ENCOUNTER — Other Ambulatory Visit: Payer: Self-pay | Admitting: Dermatology

## 2014-05-15 DIAGNOSIS — D485 Neoplasm of uncertain behavior of skin: Secondary | ICD-10-CM | POA: Diagnosis not present

## 2014-05-15 DIAGNOSIS — L820 Inflamed seborrheic keratosis: Secondary | ICD-10-CM | POA: Diagnosis not present

## 2014-05-24 DIAGNOSIS — K760 Fatty (change of) liver, not elsewhere classified: Secondary | ICD-10-CM | POA: Diagnosis not present

## 2014-05-24 DIAGNOSIS — E039 Hypothyroidism, unspecified: Secondary | ICD-10-CM | POA: Diagnosis not present

## 2014-05-24 DIAGNOSIS — R7301 Impaired fasting glucose: Secondary | ICD-10-CM | POA: Diagnosis not present

## 2014-05-24 DIAGNOSIS — N390 Urinary tract infection, site not specified: Secondary | ICD-10-CM | POA: Diagnosis not present

## 2014-06-09 DIAGNOSIS — Z113 Encounter for screening for infections with a predominantly sexual mode of transmission: Secondary | ICD-10-CM | POA: Diagnosis not present

## 2014-06-09 DIAGNOSIS — Z124 Encounter for screening for malignant neoplasm of cervix: Secondary | ICD-10-CM | POA: Diagnosis not present

## 2014-06-09 DIAGNOSIS — Z114 Encounter for screening for human immunodeficiency virus [HIV]: Secondary | ICD-10-CM | POA: Diagnosis not present

## 2014-06-09 DIAGNOSIS — Z01419 Encounter for gynecological examination (general) (routine) without abnormal findings: Secondary | ICD-10-CM | POA: Diagnosis not present

## 2014-06-09 DIAGNOSIS — Z118 Encounter for screening for other infectious and parasitic diseases: Secondary | ICD-10-CM | POA: Diagnosis not present

## 2014-06-09 DIAGNOSIS — Z1231 Encounter for screening mammogram for malignant neoplasm of breast: Secondary | ICD-10-CM | POA: Diagnosis not present

## 2014-06-09 DIAGNOSIS — N760 Acute vaginitis: Secondary | ICD-10-CM | POA: Diagnosis not present

## 2014-06-09 DIAGNOSIS — Z1159 Encounter for screening for other viral diseases: Secondary | ICD-10-CM | POA: Diagnosis not present

## 2014-06-18 ENCOUNTER — Other Ambulatory Visit: Payer: Self-pay | Admitting: Family Medicine

## 2014-06-21 DIAGNOSIS — G5601 Carpal tunnel syndrome, right upper limb: Secondary | ICD-10-CM | POA: Diagnosis not present

## 2014-06-22 ENCOUNTER — Other Ambulatory Visit: Payer: Self-pay | Admitting: Family Medicine

## 2014-06-23 DIAGNOSIS — Z85828 Personal history of other malignant neoplasm of skin: Secondary | ICD-10-CM | POA: Diagnosis not present

## 2014-06-23 DIAGNOSIS — M950 Acquired deformity of nose: Secondary | ICD-10-CM | POA: Diagnosis not present

## 2014-07-06 DIAGNOSIS — M545 Low back pain: Secondary | ICD-10-CM | POA: Diagnosis not present

## 2014-07-06 DIAGNOSIS — M47817 Spondylosis without myelopathy or radiculopathy, lumbosacral region: Secondary | ICD-10-CM | POA: Diagnosis not present

## 2014-07-25 DIAGNOSIS — M950 Acquired deformity of nose: Secondary | ICD-10-CM | POA: Diagnosis not present

## 2014-07-25 DIAGNOSIS — Z85828 Personal history of other malignant neoplasm of skin: Secondary | ICD-10-CM | POA: Diagnosis not present

## 2014-08-04 ENCOUNTER — Other Ambulatory Visit: Payer: Self-pay | Admitting: Family Medicine

## 2014-08-07 DIAGNOSIS — M47817 Spondylosis without myelopathy or radiculopathy, lumbosacral region: Secondary | ICD-10-CM | POA: Diagnosis not present

## 2014-08-07 DIAGNOSIS — M545 Low back pain: Secondary | ICD-10-CM | POA: Diagnosis not present

## 2014-08-09 DIAGNOSIS — M79641 Pain in right hand: Secondary | ICD-10-CM | POA: Diagnosis not present

## 2014-08-09 DIAGNOSIS — G5601 Carpal tunnel syndrome, right upper limb: Secondary | ICD-10-CM | POA: Diagnosis not present

## 2014-08-17 DIAGNOSIS — G5601 Carpal tunnel syndrome, right upper limb: Secondary | ICD-10-CM | POA: Diagnosis not present

## 2014-08-22 ENCOUNTER — Other Ambulatory Visit: Payer: Self-pay | Admitting: Family Medicine

## 2014-08-22 NOTE — Telephone Encounter (Signed)
Flexeril refilled. Please inform the pt   A. Lincoln Brigham MD, [facility] Resident PGY-1 Pager [phone]

## 2014-08-24 ENCOUNTER — Telehealth: Payer: Self-pay | Admitting: *Deleted

## 2014-08-24 DIAGNOSIS — Z4789 Encounter for other orthopedic aftercare: Secondary | ICD-10-CM | POA: Diagnosis not present

## 2014-08-24 NOTE — Telephone Encounter (Signed)
Prior Authorization received from [place] for [facility]. Formulary and PA form placed in provider box for completion. Derl Barrow, RN

## 2014-08-25 NOTE — Telephone Encounter (Signed)
PA form faxed to [facility] for review.  Process could take 24-72 hours to review.  Derl Barrow, RN

## 2014-08-28 NOTE — Telephone Encounter (Signed)
PA for cyclobenzaprine approved thru [place] 05/28/14-08/26/2015.  [facility] aware of approval.  Derl Barrow, RN

## 2014-08-31 DIAGNOSIS — G5601 Carpal tunnel syndrome, right upper limb: Secondary | ICD-10-CM | POA: Diagnosis not present

## 2014-08-31 DIAGNOSIS — M25531 Pain in right wrist: Secondary | ICD-10-CM | POA: Diagnosis not present

## 2014-08-31 DIAGNOSIS — M25431 Effusion, right wrist: Secondary | ICD-10-CM | POA: Diagnosis not present

## 2014-09-14 DIAGNOSIS — Z4789 Encounter for other orthopedic aftercare: Secondary | ICD-10-CM | POA: Diagnosis not present

## 2014-09-22 DIAGNOSIS — N390 Urinary tract infection, site not specified: Secondary | ICD-10-CM | POA: Diagnosis not present

## 2014-10-17 ENCOUNTER — Other Ambulatory Visit: Payer: Self-pay | Admitting: Student

## 2014-10-17 NOTE — Telephone Encounter (Signed)
Refill request from pharmacy. Will forward to PCP for review. ,, CMA. 

## 2014-10-19 NOTE — Telephone Encounter (Signed)
Flexeril refilled. Please inform the patient  Thanks   A. Lincoln Brigham MD, [facility] Resident PGY-2 Pager [phone]

## 2014-10-24 DIAGNOSIS — Z85828 Personal history of other malignant neoplasm of skin: Secondary | ICD-10-CM | POA: Diagnosis not present

## 2014-10-24 DIAGNOSIS — M950 Acquired deformity of nose: Secondary | ICD-10-CM | POA: Diagnosis not present

## 2014-10-25 DIAGNOSIS — M7061 Trochanteric bursitis, right hip: Secondary | ICD-10-CM | POA: Diagnosis not present

## 2014-11-27 ENCOUNTER — Other Ambulatory Visit: Payer: Self-pay | Admitting: *Deleted

## 2014-11-27 DIAGNOSIS — I10 Essential (primary) hypertension: Secondary | ICD-10-CM

## 2014-11-27 MED ORDER — PROPRANOLOL HCL 10 MG PO TABS: 10.0000 mg | ORAL_TABLET | Freq: Two times a day (BID) | ORAL | Status: DC

## 2014-11-28 DIAGNOSIS — E039 Hypothyroidism, unspecified: Secondary | ICD-10-CM | POA: Diagnosis not present

## 2014-11-28 DIAGNOSIS — K760 Fatty (change of) liver, not elsewhere classified: Secondary | ICD-10-CM | POA: Diagnosis not present

## 2014-12-07 DIAGNOSIS — Z23 Encounter for immunization: Secondary | ICD-10-CM | POA: Diagnosis not present

## 2014-12-28 DIAGNOSIS — G44209 Tension-type headache, unspecified, not intractable: Secondary | ICD-10-CM | POA: Diagnosis not present

## 2015-01-18 DIAGNOSIS — M545 Low back pain: Secondary | ICD-10-CM | POA: Diagnosis not present

## 2015-01-18 DIAGNOSIS — M47817 Spondylosis without myelopathy or radiculopathy, lumbosacral region: Secondary | ICD-10-CM | POA: Diagnosis not present

## 2015-02-09 DIAGNOSIS — L719 Rosacea, unspecified: Secondary | ICD-10-CM | POA: Diagnosis not present

## 2015-02-09 DIAGNOSIS — M950 Acquired deformity of nose: Secondary | ICD-10-CM | POA: Diagnosis not present

## 2015-02-09 DIAGNOSIS — Z85828 Personal history of other malignant neoplasm of skin: Secondary | ICD-10-CM | POA: Diagnosis not present

## 2015-03-05 DIAGNOSIS — Z0100 Encounter for examination of eyes and vision without abnormal findings: Secondary | ICD-10-CM | POA: Diagnosis not present

## 2015-03-19 DIAGNOSIS — L719 Rosacea, unspecified: Secondary | ICD-10-CM | POA: Diagnosis not present

## 2015-03-19 DIAGNOSIS — L821 Other seborrheic keratosis: Secondary | ICD-10-CM | POA: Diagnosis not present

## 2015-03-19 DIAGNOSIS — Z23 Encounter for immunization: Secondary | ICD-10-CM | POA: Diagnosis not present

## 2015-03-19 DIAGNOSIS — D225 Melanocytic nevi of trunk: Secondary | ICD-10-CM | POA: Diagnosis not present

## 2015-03-19 DIAGNOSIS — Z85828 Personal history of other malignant neoplasm of skin: Secondary | ICD-10-CM | POA: Diagnosis not present

## 2015-03-21 ENCOUNTER — Other Ambulatory Visit: Payer: Self-pay

## 2015-03-21 DIAGNOSIS — Z1231 Encounter for screening mammogram for malignant neoplasm of breast: Secondary | ICD-10-CM

## 2015-04-09 ENCOUNTER — Ambulatory Visit
Admission: RE | Admit: 2015-04-09 | Discharge: 2015-04-09 | Disposition: A | Discharge status: Home / Self Care | Payer: PPO | Source: Ambulatory Visit

## 2015-04-09 DIAGNOSIS — Z1231 Encounter for screening mammogram for malignant neoplasm of breast: Secondary | ICD-10-CM | POA: Diagnosis not present

## 2015-04-20 DIAGNOSIS — M950 Acquired deformity of nose: Secondary | ICD-10-CM | POA: Diagnosis not present

## 2015-04-20 DIAGNOSIS — L718 Other rosacea: Secondary | ICD-10-CM | POA: Diagnosis not present

## 2015-04-20 DIAGNOSIS — Z85828 Personal history of other malignant neoplasm of skin: Secondary | ICD-10-CM | POA: Diagnosis not present

## 2015-04-23 DIAGNOSIS — F1190 Opioid use, unspecified, uncomplicated: Secondary | ICD-10-CM | POA: Diagnosis not present

## 2015-04-23 DIAGNOSIS — S76019A Strain of muscle, fascia and tendon of unspecified hip, initial encounter: Secondary | ICD-10-CM | POA: Diagnosis not present

## 2015-04-23 DIAGNOSIS — M545 Low back pain: Secondary | ICD-10-CM | POA: Diagnosis not present

## 2015-04-23 DIAGNOSIS — R7309 Other abnormal glucose: Secondary | ICD-10-CM | POA: Diagnosis not present

## 2015-04-23 DIAGNOSIS — K760 Fatty (change of) liver, not elsewhere classified: Secondary | ICD-10-CM | POA: Diagnosis not present

## 2015-04-23 DIAGNOSIS — I10 Essential (primary) hypertension: Secondary | ICD-10-CM | POA: Diagnosis not present

## 2015-04-30 ENCOUNTER — Other Ambulatory Visit: Payer: Self-pay | Admitting: Internal Medicine

## 2015-04-30 DIAGNOSIS — F329 Major depressive disorder, single episode, unspecified: Secondary | ICD-10-CM | POA: Diagnosis not present

## 2015-04-30 DIAGNOSIS — R1319 Other dysphagia: Secondary | ICD-10-CM

## 2015-04-30 DIAGNOSIS — R1310 Dysphagia, unspecified: Secondary | ICD-10-CM

## 2015-04-30 DIAGNOSIS — R1314 Dysphagia, pharyngoesophageal phase: Secondary | ICD-10-CM | POA: Diagnosis not present

## 2015-04-30 DIAGNOSIS — F1190 Opioid use, unspecified, uncomplicated: Secondary | ICD-10-CM | POA: Diagnosis not present

## 2015-04-30 DIAGNOSIS — R7309 Other abnormal glucose: Secondary | ICD-10-CM | POA: Diagnosis not present

## 2015-05-09 ENCOUNTER — Ambulatory Visit
Admission: RE | Admit: 2015-05-09 | Discharge: 2015-05-09 | Disposition: A | Discharge status: Home / Self Care | Payer: PPO | Source: Ambulatory Visit | Attending: Internal Medicine | Admitting: Internal Medicine

## 2015-05-09 DIAGNOSIS — K219 Gastro-esophageal reflux disease without esophagitis: Secondary | ICD-10-CM | POA: Diagnosis not present

## 2015-05-09 DIAGNOSIS — R1319 Other dysphagia: Secondary | ICD-10-CM

## 2015-05-09 DIAGNOSIS — R1310 Dysphagia, unspecified: Secondary | ICD-10-CM

## 2015-05-25 DIAGNOSIS — F1190 Opioid use, unspecified, uncomplicated: Secondary | ICD-10-CM | POA: Diagnosis not present

## 2015-05-25 DIAGNOSIS — K760 Fatty (change of) liver, not elsewhere classified: Secondary | ICD-10-CM | POA: Diagnosis not present

## 2015-05-25 DIAGNOSIS — R7309 Other abnormal glucose: Secondary | ICD-10-CM | POA: Diagnosis not present

## 2015-05-25 DIAGNOSIS — R1312 Dysphagia, oropharyngeal phase: Secondary | ICD-10-CM | POA: Diagnosis not present

## 2015-05-28 DIAGNOSIS — M545 Low back pain: Secondary | ICD-10-CM | POA: Diagnosis not present

## 2015-05-28 DIAGNOSIS — M47817 Spondylosis without myelopathy or radiculopathy, lumbosacral region: Secondary | ICD-10-CM | POA: Diagnosis not present

## 2015-05-28 DIAGNOSIS — M7061 Trochanteric bursitis, right hip: Secondary | ICD-10-CM | POA: Diagnosis not present

## 2015-05-29 DIAGNOSIS — M950 Acquired deformity of nose: Secondary | ICD-10-CM | POA: Diagnosis not present

## 2015-05-29 DIAGNOSIS — Z85828 Personal history of other malignant neoplasm of skin: Secondary | ICD-10-CM | POA: Diagnosis not present

## 2015-05-31 DIAGNOSIS — M545 Low back pain: Secondary | ICD-10-CM | POA: Diagnosis not present

## 2015-05-31 DIAGNOSIS — M47817 Spondylosis without myelopathy or radiculopathy, lumbosacral region: Secondary | ICD-10-CM | POA: Diagnosis not present

## 2015-06-22 DIAGNOSIS — Z85828 Personal history of other malignant neoplasm of skin: Secondary | ICD-10-CM | POA: Diagnosis not present

## 2015-07-30 DIAGNOSIS — Z01419 Encounter for gynecological examination (general) (routine) without abnormal findings: Secondary | ICD-10-CM | POA: Diagnosis not present

## 2015-07-30 DIAGNOSIS — Z124 Encounter for screening for malignant neoplasm of cervix: Secondary | ICD-10-CM | POA: Diagnosis not present

## 2015-09-14 DIAGNOSIS — E039 Hypothyroidism, unspecified: Secondary | ICD-10-CM | POA: Diagnosis not present

## 2015-09-14 DIAGNOSIS — Z780 Asymptomatic menopausal state: Secondary | ICD-10-CM | POA: Diagnosis not present

## 2015-09-14 DIAGNOSIS — N390 Urinary tract infection, site not specified: Secondary | ICD-10-CM | POA: Diagnosis not present

## 2015-09-14 DIAGNOSIS — Z0000 Encounter for general adult medical examination without abnormal findings: Secondary | ICD-10-CM | POA: Diagnosis not present

## 2015-09-14 DIAGNOSIS — R1312 Dysphagia, oropharyngeal phase: Secondary | ICD-10-CM | POA: Diagnosis not present

## 2015-09-14 DIAGNOSIS — F329 Major depressive disorder, single episode, unspecified: Secondary | ICD-10-CM | POA: Diagnosis not present

## 2015-09-26 DIAGNOSIS — F17200 Nicotine dependence, unspecified, uncomplicated: Secondary | ICD-10-CM | POA: Diagnosis not present

## 2015-09-26 DIAGNOSIS — K760 Fatty (change of) liver, not elsewhere classified: Secondary | ICD-10-CM | POA: Diagnosis not present

## 2015-09-26 DIAGNOSIS — R7309 Other abnormal glucose: Secondary | ICD-10-CM | POA: Diagnosis not present

## 2015-09-26 DIAGNOSIS — E039 Hypothyroidism, unspecified: Secondary | ICD-10-CM | POA: Diagnosis not present

## 2015-10-18 DIAGNOSIS — M47817 Spondylosis without myelopathy or radiculopathy, lumbosacral region: Secondary | ICD-10-CM | POA: Diagnosis not present

## 2015-10-18 DIAGNOSIS — M545 Low back pain: Secondary | ICD-10-CM | POA: Diagnosis not present

## 2015-11-06 DIAGNOSIS — M47817 Spondylosis without myelopathy or radiculopathy, lumbosacral region: Secondary | ICD-10-CM | POA: Diagnosis not present

## 2015-11-06 DIAGNOSIS — M545 Low back pain: Secondary | ICD-10-CM | POA: Diagnosis not present

## 2015-11-12 DIAGNOSIS — M545 Low back pain: Secondary | ICD-10-CM | POA: Diagnosis not present

## 2015-12-04 DIAGNOSIS — M545 Low back pain: Secondary | ICD-10-CM | POA: Diagnosis not present

## 2015-12-04 DIAGNOSIS — M47817 Spondylosis without myelopathy or radiculopathy, lumbosacral region: Secondary | ICD-10-CM | POA: Diagnosis not present

## 2015-12-19 ENCOUNTER — Emergency Department (HOSPITAL_COMMUNITY): Payer: PPO

## 2015-12-19 ENCOUNTER — Encounter (HOSPITAL_COMMUNITY): Payer: Self-pay

## 2015-12-19 ENCOUNTER — Emergency Department (HOSPITAL_COMMUNITY)
Admission: EM | Admit: 2015-12-19 | Discharge: 2015-12-19 | Disposition: A | Discharge status: Home / Self Care | Payer: PPO | Attending: Physician Assistant | Admitting: Physician Assistant

## 2015-12-19 DIAGNOSIS — R079 Chest pain, unspecified: Secondary | ICD-10-CM | POA: Diagnosis not present

## 2015-12-19 DIAGNOSIS — E039 Hypothyroidism, unspecified: Secondary | ICD-10-CM | POA: Diagnosis not present

## 2015-12-19 DIAGNOSIS — Z87891 Personal history of nicotine dependence: Secondary | ICD-10-CM | POA: Insufficient documentation

## 2015-12-19 DIAGNOSIS — I10 Essential (primary) hypertension: Secondary | ICD-10-CM | POA: Insufficient documentation

## 2015-12-19 DIAGNOSIS — Z7984 Long term (current) use of oral hypoglycemic drugs: Secondary | ICD-10-CM | POA: Insufficient documentation

## 2015-12-19 DIAGNOSIS — R0789 Other chest pain: Secondary | ICD-10-CM | POA: Diagnosis not present

## 2015-12-19 LAB — I-STAT TROPONIN, ED
TROPONIN I, POC: 0.01 ng/mL (ref 0.00–0.08)
Troponin i, poc: 0.01 ng/mL (ref 0.00–0.08)

## 2015-12-19 LAB — BASIC METABOLIC PANEL
ANION GAP: 11 (ref 5–15)
BUN: 13 mg/dL (ref 6–20)
CALCIUM: 9.5 mg/dL (ref 8.9–10.3)
CO2: 25 mmol/L (ref 22–32)
Chloride: 103 mmol/L (ref 101–111)
Creatinine, Ser: 0.75 mg/dL (ref 0.44–1.00)
GFR calc Af Amer: >60 mL/min (ref >60)
GLUCOSE: 126 mg/dL — AB (ref 65–99)
Potassium: 3.7 mmol/L (ref 3.5–5.1)
Sodium: 139 mmol/L (ref 135–145)

## 2015-12-19 LAB — CBC
HEMATOCRIT: 44.6 % (ref 36.0–46.0)
HEMOGLOBIN: 15 g/dL (ref 12.0–15.0)
MCH: 31.1 pg (ref 26.0–34.0)
MCHC: 33.6 g/dL (ref 30.0–36.0)
MCV: 92.3 fL (ref 78.0–100.0)
Platelets: 247 10*3/uL (ref 150–400)
RBC: 4.83 MIL/uL (ref 3.87–5.11)
RDW: 12.3 % (ref 11.5–15.5)
WBC: 11.1 10*3/uL — ABNORMAL HIGH (ref 4.0–10.5)

## 2015-12-19 MED ORDER — ASPIRIN 81 MG PO CHEW
324.0000 mg | CHEWABLE_TABLET | Freq: Once | ORAL | Status: AC
  Administered 2015-12-19: 324 mg via ORAL
  Filled 2015-12-19: qty 4

## 2015-12-19 MED ORDER — CYCLOBENZAPRINE HCL 10 MG PO TABS: 10.0000 mg | ORAL_TABLET | Freq: Two times a day (BID) | ORAL | 0 refills | Status: DC | PRN

## 2015-12-19 NOTE — ED Triage Notes (Signed)
Patient here with CP and right arm pain since yesterday. States that the pain is a tightness and minimal relief with vicodin. Alert and oriented, NAD

## 2015-12-19 NOTE — ED Provider Notes (Signed)
[facility] DEPT Provider Note   CSN: [id number] Arrival date & time: 12/19/15  1252     History   Chief Complaint Chief Complaint  Patient presents with  . Chest Pain    HPI BURNICE KURZAWA is a 70 y.o. female.  HPI   Patient's very pleasant 70 year old female with past medical history significant for chronic back pain, chronic pain, presenting today with pain to the right shoulder. Patient woke up this morning with painful right shoulder radiating to the center of her chest. It is worse with movement. Patient unable to raise arm above 45 without having increased pain. Patient also states reports soreness to the center of the chest. No shortness of breath no chest pain no radiation to the left side radiation to the neck. No diaphoresis area and patient's risk factors are hypertension, no hyperlipidemia, no diabetes, occasionally smokes, no family history of early cardiac death.    Past Medical History:  Diagnosis Date  . Back pain    d/t horse fell on pt in the 80's  . Bone spur    in neck(since 1990) and 1st 3 fingers on right hand go numb  . Chronic pain    d/t horse accident in the 80's  . Headache(784.0)    tension  . History of colon polyps   . Hyperlipidemia    takes Fish Oil and CoQ10  . Hypertension    takes Inderal daily  . Hypothyroid 05/07/2011   takes Synthroid daily  . Leg cramps    takes Potassium prn  . Nausea & vomiting    phenergan and zofran prn  . Spondylolisthesis   . Spondylosis of thoracolumbar region without myelopathy or radiculopathy    spine  . Urinary frequency     Patient Active Problem List   Diagnosis Date Noted  . Chronic back pain 03/21/2014  . Depression screening 03/21/2014  . UTI (urinary tract infection) 12/06/2013  . Cold sore 08/31/2013  . Insomnia 03/03/2013  . Depression 02/02/2013  . Acute anxiety 01/04/2013  . Odynophagia 12/14/2012  . Essential hypertension, benign 05/06/2012  . Gallstones 04/13/2012  .  Back pain 01/13/2012  . Weight gain 09/05/2011  . Hyperlipidemia 09/05/2011  . Hypothyroid 05/07/2011  . Chronic pain 05/07/2011    Past Surgical History:  Procedure Laterality Date  . [place]  . CHOLECYSTECTOMY N/A 05/20/2012   Procedure: LAPAROSCOPIC CHOLECYSTECTOMY WITH INTRAOPERATIVE CHOLANGIOGRAM;  Surgeon: Merrie Roof, MD;  Location: [facility];  Service: General;  Laterality: N/A;  . COLONOSCOPY    . TUBAL LIGATION  1981    OB History    No data available       Home Medications    Prior to Admission medications   Medication Sig Start Date End Date Taking? Authorizing Provider  ALPRAZolam Duanne Moron) 0.5 MG tablet Take 0.5-1 tablets (0.25-0.5 mg total) by mouth at bedtime as needed for anxiety. 03/03/13   Waldemar Dickens, MD  cyclobenzaprine (FLEXERIL) 5 MG tablet TAKE 1 TABLET 2 TIMES DAILYAS NEEDED FOR MUSCLE SPASMS 06/23/14   Alyssa A Haney, MD  cyclobenzaprine (FLEXERIL) 5 MG tablet TAKE 1 TABLET 2 TIMES DAILYAS NEEDED FOR MUSCLE SPASMS 08/22/14   Alyssa A Haney, MD  cyclobenzaprine (FLEXERIL) 5 MG tablet TAKE 1 TABLET TWICE A DAY  AS NEEDED FOR MUSCLE SPASMS 10/19/14   Veatrice Bourbon, MD  DHEA 10 MG TABS Take by mouth.    Historical Provider, MD  levothyroxine (SYNTHROID) 125 MCG tablet TAKE  1 TABLET DAILY BEFORE BREAKFAST Patient taking differently: Take 100 mcg by mouth. TAKE 1 TABLET DAILY BEFORE BREAKFAST 03/23/13   Waldemar Dickens, MD  metFORMIN (GLUCOPHAGE) 500 MG tablet Take 500 mg by mouth daily with breakfast.     Historical Provider, MD  oxyCODONE-acetaminophen (PERCOCET/ROXICET) 5-325 MG per tablet Take 1 tablet by mouth every 4 (four) hours as needed for severe pain. 03/21/14   Kinnie Feil, MD  PREMARIN vaginal cream  11/26/12   Historical Provider, MD  propranolol (INDERAL) 10 MG tablet Take 1 tablet (10 mg total) by mouth 2 (two) times daily. 11/27/14   Alyssa A Haney, MD  sulfamethoxazole-trimethoprim (BACTRIM DS,SEPTRA DS) 800-160 MG per tablet  TAKE 1 TABLET BY MOUTH 2 (TWO) TIMES DAILY. 04/28/14   Alyssa A Haney, MD  traMADol (ULTRAM) 50 MG tablet TAKE 1 TABLET EVERY 8 HOURS AS NEEDED 08/07/14   Veatrice Bourbon, MD  traZODone (DESYREL) 50 MG tablet Take 0.5-1 tablets (25-50 mg total) by mouth at bedtime as needed for sleep. Patient not taking: Reported on 03/21/2014 03/03/13   Waldemar Dickens, MD  valACYclovir (VALTREX) 500 MG tablet Take 4 tablets (2,000 mg total) by mouth daily. Patient not taking: Reported on 03/21/2014 08/31/13   Bernadene Bell, MD  vitamin E 600 UNIT capsule Take 2 Units by mouth daily.     Historical Provider, MD    Family History Family History  Problem Relation Age of Onset  . Hypertension Mother   . Alcohol abuse Mother   . Depression Father   . Bipolar disorder Father   . Colon cancer Paternal Grandmother   . Cancer Paternal Grandmother     colon  . Heart attack Paternal Grandfather   . Heart disease Paternal Uncle   . Hyperlipidemia Paternal Uncle     Social History Social History  Substance Use Topics  . Smoking status: Former Smoker    Packs/day: 0.80    Years: 45.00    Types: Cigarettes  . Smokeless tobacco: Never Used     Comment: Quit smoking on 02/2014  . Alcohol use 0.0 oz/week     Comment: red wine couple of times a week     Allergies   Penicillins   Review of Systems Review of Systems  Constitutional: Negative for activity change and fever.  HENT: Negative for congestion.   Respiratory: Negative for shortness of breath.   Cardiovascular: Positive for chest pain. Negative for leg swelling.  Gastrointestinal: Negative for abdominal pain.  All other systems reviewed and are negative.    Physical Exam Updated Vital Signs BP 158/84 (BP Location: Left Arm)   Pulse 72   Temp 100 F (37.8 C) (Oral)   Resp 18   Ht 5\' 4"  (1.626 m)   Wt 140 lb (63.5 kg)   SpO2 96%   BMI 24.03 kg/m   Physical Exam  Constitutional: She is oriented to person, place, and time. She appears  well-developed and well-nourished.  HENT:  Head: Normocephalic and atraumatic.  Eyes: Right eye exhibits no discharge.  Cardiovascular: Normal rate, regular rhythm and normal heart sounds.   No murmur heard. Pulmonary/Chest: Effort normal and breath sounds normal. She has no wheezes. She has no rales.  Abdominal: Soft. She exhibits no distension. There is no tenderness.  Musculoskeletal: She exhibits tenderness. She exhibits no edema or deformity.  Pain with internal rotation of R shoulder. No erytehma, no swelling. Pain with palpation of muscles in chest wall.   Neurological: She  is oriented to person, place, and time.  Skin: Skin is warm and dry. She is not diaphoretic.  Psychiatric: She has a normal mood and affect.  Nursing note and vitals reviewed.    ED Treatments / Results  Labs (all labs ordered are listed, but only abnormal results are displayed) Labs Reviewed  BASIC METABOLIC PANEL - Abnormal; Notable for the following:       Result Value   Glucose, Bld 126 (*)    All other components within normal limits  CBC - Abnormal; Notable for the following:    WBC 11.1 (*)    All other components within normal limits  I-STAT TROPOININ, ED  I-STAT TROPOININ, ED    EKG  EKG Interpretation  Date/Time:  Wednesday December 19 2015 12:56:42 EDT Ventricular Rate:  77 PR Interval:  156 QRS Duration: 68 QT Interval:  400 QTC Calculation: 452 R Axis:   79 Text Interpretation:  Normal sinus rhythm Low voltage QRS Cannot rule out Anterior infarct , age undetermined Abnormal ECG Normal sinus rhythm Confirmed by Gerald Leitz ([id number]) on 12/19/2015 4:23:02 PM       Radiology Dg Chest 2 View  Result Date: 12/19/2015 CLINICAL DATA:  Right arm and chest pain yesterday history of hypertension EXAM: CHEST  2 VIEW COMPARISON:  02/12/2014 FINDINGS: The heart size and mediastinal contours are within normal limits. Both lungs are clear. The visualized skeletal structures are  unremarkable. Surgical clips in the right upper quadrant IMPRESSION: No active cardiopulmonary disease. Electronically Signed   By: Donavan Foil M.D.   On: 12/19/2015 14:02    Procedures Procedures (including critical care time)  Medications Ordered in ED Medications  aspirin chewable tablet 324 mg (not administered)     Initial Impression / Assessment and Plan / ED Course  I have reviewed the triage vital signs and the nursing notes.  Pertinent labs & imaging results that were available during my care of the patient were reviewed by me and considered in my medical decision making (see chart for details).  Clinical Course    Patient a well-appearing 70 year old female presenting with right shoulder pain radiating to the center of her chest. Patient's pain is located in the right shoulder and worse with movement. It appears musculoskeletal/joint related. Patient has history of chronic pain. Given patient's heart score. We'll do a delta troponin. If negative we'll discharge with instructions for using heat/cold and supportive measures to help with pain in the right shoulder. Additionally we'll have her follow-up with her primary care physician.   Delta trop negative  Patient is comfortable, ambulatory, and taking PO at time of discharge.  Patient expressed understanding about return precautions.    Final Clinical Impressions(s) / ED Diagnoses   Final diagnoses:  None    New Prescriptions New Prescriptions   No medications on file     Tacarra Justo Julio Alm, MD 12/19/15 2043

## 2015-12-19 NOTE — Discharge Instructions (Signed)
You were seen today here with shoulder pain. We do not believe that this is your heart. Please use heat ice and stretching to help with the pain. Please follow up with her primary care physician. Please return with any concerns.

## 2015-12-19 NOTE — ED Notes (Signed)
Pt standing in door way. Pt states that she is supposed to be discharging and that she wants the IV removed. Pt informed that she is not officially up for discharge yet and that this RN would be her new RN and that it would be removed at the time of discharge.

## 2015-12-27 DIAGNOSIS — R7309 Other abnormal glucose: Secondary | ICD-10-CM | POA: Diagnosis not present

## 2015-12-27 DIAGNOSIS — Z85828 Personal history of other malignant neoplasm of skin: Secondary | ICD-10-CM | POA: Diagnosis not present

## 2015-12-27 DIAGNOSIS — L821 Other seborrheic keratosis: Secondary | ICD-10-CM | POA: Diagnosis not present

## 2015-12-27 DIAGNOSIS — L720 Epidermal cyst: Secondary | ICD-10-CM | POA: Diagnosis not present

## 2015-12-27 DIAGNOSIS — M545 Low back pain: Secondary | ICD-10-CM | POA: Diagnosis not present

## 2015-12-27 DIAGNOSIS — E039 Hypothyroidism, unspecified: Secondary | ICD-10-CM | POA: Diagnosis not present

## 2015-12-27 DIAGNOSIS — F1190 Opioid use, unspecified, uncomplicated: Secondary | ICD-10-CM | POA: Diagnosis not present

## 2016-01-02 DIAGNOSIS — Z23 Encounter for immunization: Secondary | ICD-10-CM | POA: Diagnosis not present

## 2016-03-20 DIAGNOSIS — L820 Inflamed seborrheic keratosis: Secondary | ICD-10-CM | POA: Diagnosis not present

## 2016-03-20 DIAGNOSIS — L821 Other seborrheic keratosis: Secondary | ICD-10-CM | POA: Diagnosis not present

## 2016-03-20 DIAGNOSIS — D225 Melanocytic nevi of trunk: Secondary | ICD-10-CM | POA: Diagnosis not present

## 2016-03-20 DIAGNOSIS — L739 Follicular disorder, unspecified: Secondary | ICD-10-CM | POA: Diagnosis not present

## 2016-03-20 DIAGNOSIS — Z85828 Personal history of other malignant neoplasm of skin: Secondary | ICD-10-CM | POA: Diagnosis not present

## 2016-03-20 DIAGNOSIS — Z23 Encounter for immunization: Secondary | ICD-10-CM | POA: Diagnosis not present

## 2016-03-20 DIAGNOSIS — L719 Rosacea, unspecified: Secondary | ICD-10-CM | POA: Diagnosis not present

## 2016-03-28 DIAGNOSIS — E039 Hypothyroidism, unspecified: Secondary | ICD-10-CM | POA: Diagnosis not present

## 2016-03-28 DIAGNOSIS — R7309 Other abnormal glucose: Secondary | ICD-10-CM | POA: Diagnosis not present

## 2016-03-28 DIAGNOSIS — F1190 Opioid use, unspecified, uncomplicated: Secondary | ICD-10-CM | POA: Diagnosis not present

## 2016-04-04 DIAGNOSIS — M150 Primary generalized (osteo)arthritis: Secondary | ICD-10-CM | POA: Diagnosis not present

## 2016-04-04 DIAGNOSIS — R7309 Other abnormal glucose: Secondary | ICD-10-CM | POA: Diagnosis not present

## 2016-04-04 DIAGNOSIS — F1190 Opioid use, unspecified, uncomplicated: Secondary | ICD-10-CM | POA: Diagnosis not present

## 2016-04-22 ENCOUNTER — Other Ambulatory Visit: Payer: Self-pay | Admitting: Internal Medicine

## 2016-04-22 DIAGNOSIS — Z1231 Encounter for screening mammogram for malignant neoplasm of breast: Secondary | ICD-10-CM

## 2016-04-29 ENCOUNTER — Ambulatory Visit
Admission: RE | Admit: 2016-04-29 | Discharge: 2016-04-29 | Disposition: A | Discharge status: Home / Self Care | Payer: PPO | Source: Ambulatory Visit | Attending: Internal Medicine | Admitting: Internal Medicine

## 2016-04-29 DIAGNOSIS — Z1231 Encounter for screening mammogram for malignant neoplasm of breast: Secondary | ICD-10-CM | POA: Diagnosis not present

## 2016-06-16 DIAGNOSIS — M7061 Trochanteric bursitis, right hip: Secondary | ICD-10-CM | POA: Diagnosis not present

## 2016-06-18 DIAGNOSIS — M4316 Spondylolisthesis, lumbar region: Secondary | ICD-10-CM | POA: Diagnosis not present

## 2016-07-02 DIAGNOSIS — E039 Hypothyroidism, unspecified: Secondary | ICD-10-CM | POA: Diagnosis not present

## 2016-07-02 DIAGNOSIS — F1190 Opioid use, unspecified, uncomplicated: Secondary | ICD-10-CM | POA: Diagnosis not present

## 2016-07-02 DIAGNOSIS — R7309 Other abnormal glucose: Secondary | ICD-10-CM | POA: Diagnosis not present

## 2016-07-08 ENCOUNTER — Ambulatory Visit (INDEPENDENT_AMBULATORY_CARE_PROVIDER_SITE_OTHER): Payer: PPO

## 2016-07-08 ENCOUNTER — Encounter (HOSPITAL_COMMUNITY): Payer: Self-pay | Admitting: Emergency Medicine

## 2016-07-08 ENCOUNTER — Ambulatory Visit (HOSPITAL_COMMUNITY)
Admission: EM | Admit: 2016-07-08 | Discharge: 2016-07-08 | Disposition: A | Discharge status: Home / Self Care | Payer: PPO | Attending: Family Medicine | Admitting: Family Medicine

## 2016-07-08 DIAGNOSIS — S63501A Unspecified sprain of right wrist, initial encounter: Secondary | ICD-10-CM

## 2016-07-08 DIAGNOSIS — S6991XA Unspecified injury of right wrist, hand and finger(s), initial encounter: Secondary | ICD-10-CM | POA: Diagnosis not present

## 2016-07-08 MED ORDER — IBUPROFEN 800 MG PO TABS: 800.0000 mg | ORAL_TABLET | Freq: Three times a day (TID) | ORAL | 0 refills | Status: DC

## 2016-07-08 NOTE — ED Triage Notes (Signed)
The patient presented to the [facility] with a complaint of pain and swelling to her right wrist that occurred after injuring it doing a cart wheel 1 week ago.

## 2016-07-08 NOTE — ED Provider Notes (Signed)
CSN: [id number]     Arrival date & time 07/08/16  1321 History   None    Chief Complaint  Patient presents with  . Wrist Pain   (Consider location/radiation/quality/duration/timing/severity/associated sxs/prior Treatment) Patient c/o right wrist pain.  She was doing cart wheels a few days ago and developed right wrist discomfort that wakes her up at night.   The history is provided by the patient.  Wrist Pain  This is a new problem. The problem occurs constantly. The problem has not changed since onset.Nothing aggravates the symptoms.    Past Medical History:  Diagnosis Date  . Back pain    d/t horse fell on pt in the 80's  . Bone spur    in neck(since 1990) and 1st 3 fingers on right hand go numb  . Chronic pain    d/t horse accident in the 80's  . Headache(784.0)    tension  . History of colon polyps   . Hyperlipidemia    takes Fish Oil and CoQ10  . Hypertension    takes Inderal daily  . Hypothyroid 05/07/2011   takes Synthroid daily  . Leg cramps    takes Potassium prn  . Nausea & vomiting    phenergan and zofran prn  . Spondylolisthesis   . Spondylosis of thoracolumbar region without myelopathy or radiculopathy    spine  . Urinary frequency    Past Surgical History:  Procedure Laterality Date  . [place]  . CHOLECYSTECTOMY N/A 05/20/2012   Procedure: LAPAROSCOPIC CHOLECYSTECTOMY WITH INTRAOPERATIVE CHOLANGIOGRAM;  Surgeon: Merrie Roof, MD;  Location: [facility];  Service: General;  Laterality: N/A;  . COLONOSCOPY    . TUBAL LIGATION  1981   Family History  Problem Relation Age of Onset  . Hypertension Mother   . Alcohol abuse Mother   . Depression Father   . Bipolar disorder Father   . Colon cancer Paternal Grandmother   . Cancer Paternal Grandmother        colon  . Heart attack Paternal Grandfather   . Heart disease Paternal Uncle   . Hyperlipidemia Paternal Uncle    Social History  Substance Use Topics  . Smoking status: Former  Smoker    Packs/day: 0.80    Years: 45.00    Types: Cigarettes  . Smokeless tobacco: Never Used     Comment: Quit smoking on 02/2014  . Alcohol use 0.0 oz/week     Comment: red wine couple of times a week   OB History    No data available     Review of Systems  Constitutional: Negative.   HENT: Negative.   Eyes: Negative.   Respiratory: Negative.   Cardiovascular: Negative.   Gastrointestinal: Negative.   Endocrine: Negative.   Genitourinary: Negative.   Musculoskeletal: Positive for arthralgias.  Allergic/Immunologic: Negative.   Neurological: Negative.   Hematological: Negative.   Psychiatric/Behavioral: Negative.     Allergies  Sulfisoxazole; Bee venom; and Penicillins  Home Medications   Prior to Admission medications   Medication Sig Start Date End Date Taking? Authorizing Provider  CALCIUM PO Take 1 tablet by mouth 2 (two) times daily.   Yes [provider]  Coenzyme Q10 (CO Q-10 PO) Take 1 capsule by mouth 2 (two) times daily.   Yes [provider]  Cyanocobalamin (VITAMIN B-12 PO) Take 1 tablet by mouth daily.   Yes [provider]  cyclobenzaprine (FLEXERIL) 10 MG tablet Take 1 tablet (10 mg total) by mouth  2 (two) times daily as needed for muscle spasms. 12/19/15  Yes Mackuen, Courteney Lyn, MD  Estradiol (VAGIFEM) 10 MCG TABS vaginal tablet Place 1 tablet vaginally 2 (two) times a week.   Yes [provider]  levothyroxine (SYNTHROID) 125 MCG tablet TAKE 1 TABLET DAILY BEFORE BREAKFAST 03/23/13  Yes Waldemar Dickens, MD  LYSINE PO Take 1 tablet by mouth daily as needed. PRIOR TO SUN EXPOSURE OR TO PREVENT FEVER BLISTERS   Yes [provider]  Nutritional Supplements (DHEA PO) Take 1 tablet by mouth daily.   Yes [provider]  oxyCODONE-acetaminophen (PERCOCET/ROXICET) 5-325 MG per tablet Take 1 tablet by mouth every 4 (four) hours as needed for severe pain. 03/21/14  Yes Kinnie Feil, MD  propranolol  (INDERAL) 10 MG tablet Take 1 tablet (10 mg total) by mouth 2 (two) times daily. 11/27/14  Yes Haney, Alyssa A, MD  traMADol (ULTRAM) 50 MG tablet TAKE 1 TABLET EVERY 8 HOURS AS NEEDED Patient taking differently: Take 50 mg by mouth every eight hours as needed for pain 08/07/14  Yes Haney, Alyssa A, MD  traZODone (DESYREL) 50 MG tablet Take 0.5-1 tablets (25-50 mg total) by mouth at bedtime as needed for sleep. 03/03/13  Yes Waldemar Dickens, MD  TURMERIC PO Take 1 capsule by mouth 2 (two) times daily.   Yes [provider]  valACYclovir (VALTREX) 500 MG tablet Take 4 tablets (2,000 mg total) by mouth daily. 08/31/13  Yes Bernadene Bell, MD  valsartan (DIOVAN) 160 MG tablet Take 160 mg by mouth daily.   Yes [provider]  ibuprofen (ADVIL,MOTRIN) 800 MG tablet Take 1 tablet (800 mg total) by mouth 3 (three) times daily. 07/08/16   Lysbeth Penner, FNP   Meds Ordered and Administered this Visit  Medications - No data to display  BP (!) 143/75 (BP Location: Right Arm)   Pulse (!) 58   Temp 98.5 F (36.9 C) (Oral)   Resp 16   SpO2 98%  No data found.   Physical Exam  Constitutional: She is oriented to person, place, and time. She appears well-developed and well-nourished.  HENT:  Head: Normocephalic.  Eyes: Conjunctivae and EOM are normal. Pupils are equal, round, and reactive to light.  Neck: Normal range of motion.  Cardiovascular: Normal rate, regular rhythm and normal heart sounds.   Pulmonary/Chest: Effort normal and breath sounds normal.  Musculoskeletal: She exhibits tenderness.  Neurological: She is alert and oriented to person, place, and time.  Tenderness right wrist and positive tinnels and phalens right wrist.  No swelling or deformity right wrist.  Nursing note and vitals reviewed.   Urgent Care Course     Procedures (including critical care time)  Labs Review Labs Reviewed - No data to display  Imaging Review Dg Wrist Complete Right  Result  Date: 07/08/2016 CLINICAL DATA:  Injury 1 week ago EXAM: RIGHT WRIST - COMPLETE 3+ VIEW COMPARISON:  None. FINDINGS: No acute fracture. No dislocation.  Unremarkable soft tissues. IMPRESSION: No acute bony pathology. Electronically Signed   By: Marybelle Killings M.D.   On: 07/08/2016 14:01     Visual Acuity Review  Right Eye Distance:   Left Eye Distance:   Bilateral Distance:    Right Eye Near:   Left Eye Near:    Bilateral Near:         MDM   1. Sprain of right wrist, initial encounter    Motrin 800mg  one po tid x 7  days Right cock up splint     Lysbeth Penner, [place] 07/08/16 [phone]

## 2016-08-07 DIAGNOSIS — Z5181 Encounter for therapeutic drug level monitoring: Secondary | ICD-10-CM | POA: Diagnosis not present

## 2016-08-07 DIAGNOSIS — F1190 Opioid use, unspecified, uncomplicated: Secondary | ICD-10-CM | POA: Diagnosis not present

## 2016-08-07 DIAGNOSIS — I10 Essential (primary) hypertension: Secondary | ICD-10-CM | POA: Diagnosis not present

## 2016-08-18 DIAGNOSIS — R6882 Decreased libido: Secondary | ICD-10-CM | POA: Diagnosis not present

## 2016-08-18 DIAGNOSIS — Z124 Encounter for screening for malignant neoplasm of cervix: Secondary | ICD-10-CM | POA: Diagnosis not present

## 2016-08-18 DIAGNOSIS — N952 Postmenopausal atrophic vaginitis: Secondary | ICD-10-CM | POA: Diagnosis not present

## 2016-09-15 DIAGNOSIS — T63441A Toxic effect of venom of bees, accidental (unintentional), initial encounter: Secondary | ICD-10-CM | POA: Diagnosis not present

## 2016-10-09 DIAGNOSIS — N952 Postmenopausal atrophic vaginitis: Secondary | ICD-10-CM | POA: Diagnosis not present

## 2016-10-09 DIAGNOSIS — R350 Frequency of micturition: Secondary | ICD-10-CM | POA: Diagnosis not present

## 2016-10-09 DIAGNOSIS — N941 Unspecified dyspareunia: Secondary | ICD-10-CM | POA: Diagnosis not present

## 2016-10-14 DIAGNOSIS — Z5181 Encounter for therapeutic drug level monitoring: Secondary | ICD-10-CM | POA: Diagnosis not present

## 2016-10-14 DIAGNOSIS — F1190 Opioid use, unspecified, uncomplicated: Secondary | ICD-10-CM | POA: Diagnosis not present

## 2016-10-14 DIAGNOSIS — R7309 Other abnormal glucose: Secondary | ICD-10-CM | POA: Diagnosis not present

## 2016-11-03 DIAGNOSIS — M545 Low back pain: Secondary | ICD-10-CM | POA: Diagnosis not present

## 2016-11-03 DIAGNOSIS — M47817 Spondylosis without myelopathy or radiculopathy, lumbosacral region: Secondary | ICD-10-CM | POA: Diagnosis not present

## 2016-11-04 DIAGNOSIS — R319 Hematuria, unspecified: Secondary | ICD-10-CM | POA: Diagnosis not present

## 2016-11-14 DIAGNOSIS — M545 Low back pain: Secondary | ICD-10-CM | POA: Diagnosis not present

## 2016-11-14 DIAGNOSIS — M47817 Spondylosis without myelopathy or radiculopathy, lumbosacral region: Secondary | ICD-10-CM | POA: Diagnosis not present

## 2016-11-19 DIAGNOSIS — R102 Pelvic and perineal pain: Secondary | ICD-10-CM | POA: Diagnosis not present

## 2016-11-19 DIAGNOSIS — R358 Other polyuria: Secondary | ICD-10-CM | POA: Diagnosis not present

## 2017-01-06 DIAGNOSIS — F1190 Opioid use, unspecified, uncomplicated: Secondary | ICD-10-CM | POA: Diagnosis not present

## 2017-01-06 DIAGNOSIS — Z5181 Encounter for therapeutic drug level monitoring: Secondary | ICD-10-CM | POA: Diagnosis not present

## 2017-01-06 DIAGNOSIS — R7309 Other abnormal glucose: Secondary | ICD-10-CM | POA: Diagnosis not present

## 2017-01-13 DIAGNOSIS — M150 Primary generalized (osteo)arthritis: Secondary | ICD-10-CM | POA: Diagnosis not present

## 2017-01-13 DIAGNOSIS — F1190 Opioid use, unspecified, uncomplicated: Secondary | ICD-10-CM | POA: Diagnosis not present

## 2017-01-13 DIAGNOSIS — F329 Major depressive disorder, single episode, unspecified: Secondary | ICD-10-CM | POA: Diagnosis not present

## 2017-01-13 DIAGNOSIS — I10 Essential (primary) hypertension: Secondary | ICD-10-CM | POA: Diagnosis not present

## 2017-01-13 DIAGNOSIS — E039 Hypothyroidism, unspecified: Secondary | ICD-10-CM | POA: Diagnosis not present

## 2017-01-13 DIAGNOSIS — M545 Low back pain: Secondary | ICD-10-CM | POA: Diagnosis not present

## 2017-01-13 DIAGNOSIS — R7309 Other abnormal glucose: Secondary | ICD-10-CM | POA: Diagnosis not present

## 2017-01-13 DIAGNOSIS — Z79899 Other long term (current) drug therapy: Secondary | ICD-10-CM | POA: Diagnosis not present

## 2017-02-18 DIAGNOSIS — M545 Low back pain: Secondary | ICD-10-CM | POA: Diagnosis not present

## 2017-02-18 DIAGNOSIS — M47817 Spondylosis without myelopathy or radiculopathy, lumbosacral region: Secondary | ICD-10-CM | POA: Diagnosis not present

## 2017-02-18 DIAGNOSIS — M7061 Trochanteric bursitis, right hip: Secondary | ICD-10-CM | POA: Diagnosis not present

## 2017-03-23 DIAGNOSIS — Z85828 Personal history of other malignant neoplasm of skin: Secondary | ICD-10-CM | POA: Diagnosis not present

## 2017-03-23 DIAGNOSIS — D225 Melanocytic nevi of trunk: Secondary | ICD-10-CM | POA: Diagnosis not present

## 2017-03-23 DIAGNOSIS — Z23 Encounter for immunization: Secondary | ICD-10-CM | POA: Diagnosis not present

## 2017-03-23 DIAGNOSIS — L821 Other seborrheic keratosis: Secondary | ICD-10-CM | POA: Diagnosis not present

## 2017-03-23 DIAGNOSIS — L719 Rosacea, unspecified: Secondary | ICD-10-CM | POA: Diagnosis not present

## 2017-03-31 ENCOUNTER — Other Ambulatory Visit: Payer: Self-pay | Admitting: Internal Medicine

## 2017-03-31 DIAGNOSIS — Z1231 Encounter for screening mammogram for malignant neoplasm of breast: Secondary | ICD-10-CM

## 2017-04-10 DIAGNOSIS — E039 Hypothyroidism, unspecified: Secondary | ICD-10-CM | POA: Diagnosis not present

## 2017-04-10 DIAGNOSIS — I10 Essential (primary) hypertension: Secondary | ICD-10-CM | POA: Diagnosis not present

## 2017-04-10 DIAGNOSIS — Z23 Encounter for immunization: Secondary | ICD-10-CM | POA: Diagnosis not present

## 2017-04-10 DIAGNOSIS — N390 Urinary tract infection, site not specified: Secondary | ICD-10-CM | POA: Diagnosis not present

## 2017-04-10 DIAGNOSIS — R7309 Other abnormal glucose: Secondary | ICD-10-CM | POA: Diagnosis not present

## 2017-04-10 DIAGNOSIS — Z0000 Encounter for general adult medical examination without abnormal findings: Secondary | ICD-10-CM | POA: Diagnosis not present

## 2017-04-21 DIAGNOSIS — M545 Low back pain: Secondary | ICD-10-CM | POA: Diagnosis not present

## 2017-04-21 DIAGNOSIS — I10 Essential (primary) hypertension: Secondary | ICD-10-CM | POA: Diagnosis not present

## 2017-04-21 DIAGNOSIS — M150 Primary generalized (osteo)arthritis: Secondary | ICD-10-CM | POA: Diagnosis not present

## 2017-04-21 DIAGNOSIS — F329 Major depressive disorder, single episode, unspecified: Secondary | ICD-10-CM | POA: Diagnosis not present

## 2017-04-21 DIAGNOSIS — G629 Polyneuropathy, unspecified: Secondary | ICD-10-CM | POA: Diagnosis not present

## 2017-04-21 DIAGNOSIS — E039 Hypothyroidism, unspecified: Secondary | ICD-10-CM | POA: Diagnosis not present

## 2017-04-21 DIAGNOSIS — R7309 Other abnormal glucose: Secondary | ICD-10-CM | POA: Diagnosis not present

## 2017-04-21 DIAGNOSIS — F1190 Opioid use, unspecified, uncomplicated: Secondary | ICD-10-CM | POA: Diagnosis not present

## 2017-04-21 DIAGNOSIS — K760 Fatty (change of) liver, not elsewhere classified: Secondary | ICD-10-CM | POA: Diagnosis not present

## 2017-05-04 ENCOUNTER — Ambulatory Visit
Admission: RE | Admit: 2017-05-04 | Discharge: 2017-05-04 | Disposition: A | Discharge status: Home / Self Care | Payer: PPO | Source: Ambulatory Visit | Attending: Internal Medicine | Admitting: Internal Medicine

## 2017-05-04 DIAGNOSIS — Z1231 Encounter for screening mammogram for malignant neoplasm of breast: Secondary | ICD-10-CM

## 2017-05-08 DIAGNOSIS — H40053 Ocular hypertension, bilateral: Secondary | ICD-10-CM | POA: Diagnosis not present

## 2017-05-08 DIAGNOSIS — H35362 Drusen (degenerative) of macula, left eye: Secondary | ICD-10-CM | POA: Diagnosis not present

## 2017-05-08 DIAGNOSIS — H43393 Other vitreous opacities, bilateral: Secondary | ICD-10-CM | POA: Diagnosis not present

## 2017-05-08 DIAGNOSIS — H40013 Open angle with borderline findings, low risk, bilateral: Secondary | ICD-10-CM | POA: Diagnosis not present

## 2017-05-20 ENCOUNTER — Encounter: Payer: Self-pay | Admitting: Internal Medicine

## 2017-06-18 DIAGNOSIS — M47817 Spondylosis without myelopathy or radiculopathy, lumbosacral region: Secondary | ICD-10-CM | POA: Diagnosis not present

## 2017-06-18 DIAGNOSIS — M5416 Radiculopathy, lumbar region: Secondary | ICD-10-CM | POA: Diagnosis not present

## 2017-06-18 DIAGNOSIS — M545 Low back pain: Secondary | ICD-10-CM | POA: Diagnosis not present

## 2017-06-30 DIAGNOSIS — F17210 Nicotine dependence, cigarettes, uncomplicated: Secondary | ICD-10-CM | POA: Diagnosis not present

## 2017-06-30 DIAGNOSIS — F329 Major depressive disorder, single episode, unspecified: Secondary | ICD-10-CM | POA: Diagnosis not present

## 2017-06-30 DIAGNOSIS — I10 Essential (primary) hypertension: Secondary | ICD-10-CM | POA: Diagnosis not present

## 2017-06-30 DIAGNOSIS — J0100 Acute maxillary sinusitis, unspecified: Secondary | ICD-10-CM | POA: Diagnosis not present

## 2017-06-30 DIAGNOSIS — M150 Primary generalized (osteo)arthritis: Secondary | ICD-10-CM | POA: Diagnosis not present

## 2017-06-30 DIAGNOSIS — R05 Cough: Secondary | ICD-10-CM | POA: Diagnosis not present

## 2017-07-02 ENCOUNTER — Other Ambulatory Visit: Payer: Self-pay | Admitting: Internal Medicine

## 2017-07-02 DIAGNOSIS — F17210 Nicotine dependence, cigarettes, uncomplicated: Secondary | ICD-10-CM

## 2017-07-03 DIAGNOSIS — H40053 Ocular hypertension, bilateral: Secondary | ICD-10-CM | POA: Diagnosis not present

## 2017-07-03 DIAGNOSIS — H40013 Open angle with borderline findings, low risk, bilateral: Secondary | ICD-10-CM | POA: Diagnosis not present

## 2017-07-08 DIAGNOSIS — R05 Cough: Secondary | ICD-10-CM | POA: Diagnosis not present

## 2017-07-08 DIAGNOSIS — R7309 Other abnormal glucose: Secondary | ICD-10-CM | POA: Diagnosis not present

## 2017-07-08 DIAGNOSIS — I10 Essential (primary) hypertension: Secondary | ICD-10-CM | POA: Diagnosis not present

## 2017-07-08 DIAGNOSIS — J0100 Acute maxillary sinusitis, unspecified: Secondary | ICD-10-CM | POA: Diagnosis not present

## 2017-07-14 ENCOUNTER — Ambulatory Visit
Admission: RE | Admit: 2017-07-14 | Discharge: 2017-07-14 | Disposition: A | Discharge status: Home / Self Care | Payer: PPO | Source: Ambulatory Visit | Attending: Internal Medicine | Admitting: Internal Medicine

## 2017-07-14 DIAGNOSIS — F17210 Nicotine dependence, cigarettes, uncomplicated: Secondary | ICD-10-CM | POA: Diagnosis not present

## 2017-07-16 DIAGNOSIS — M5416 Radiculopathy, lumbar region: Secondary | ICD-10-CM | POA: Diagnosis not present

## 2017-07-16 DIAGNOSIS — M545 Low back pain: Secondary | ICD-10-CM | POA: Diagnosis not present

## 2017-07-16 DIAGNOSIS — M47817 Spondylosis without myelopathy or radiculopathy, lumbosacral region: Secondary | ICD-10-CM | POA: Diagnosis not present

## 2017-07-19 DIAGNOSIS — W57XXXS Bitten or stung by nonvenomous insect and other nonvenomous arthropods, sequela: Secondary | ICD-10-CM | POA: Diagnosis not present

## 2017-07-19 DIAGNOSIS — S90861S Insect bite (nonvenomous), right foot, sequela: Secondary | ICD-10-CM | POA: Diagnosis not present

## 2017-07-22 DIAGNOSIS — K760 Fatty (change of) liver, not elsewhere classified: Secondary | ICD-10-CM | POA: Diagnosis not present

## 2017-07-22 DIAGNOSIS — R7309 Other abnormal glucose: Secondary | ICD-10-CM | POA: Diagnosis not present

## 2017-07-22 DIAGNOSIS — I10 Essential (primary) hypertension: Secondary | ICD-10-CM | POA: Diagnosis not present

## 2017-07-22 DIAGNOSIS — F1190 Opioid use, unspecified, uncomplicated: Secondary | ICD-10-CM | POA: Diagnosis not present

## 2017-07-22 DIAGNOSIS — Z5181 Encounter for therapeutic drug level monitoring: Secondary | ICD-10-CM | POA: Diagnosis not present

## 2017-07-22 DIAGNOSIS — E039 Hypothyroidism, unspecified: Secondary | ICD-10-CM | POA: Diagnosis not present

## 2017-07-28 DIAGNOSIS — H903 Sensorineural hearing loss, bilateral: Secondary | ICD-10-CM | POA: Diagnosis not present

## 2017-08-05 DIAGNOSIS — M545 Low back pain: Secondary | ICD-10-CM | POA: Diagnosis not present

## 2017-08-05 DIAGNOSIS — M47817 Spondylosis without myelopathy or radiculopathy, lumbosacral region: Secondary | ICD-10-CM | POA: Diagnosis not present

## 2017-08-11 DIAGNOSIS — H903 Sensorineural hearing loss, bilateral: Secondary | ICD-10-CM | POA: Diagnosis not present

## 2017-09-21 DIAGNOSIS — G4733 Obstructive sleep apnea (adult) (pediatric): Secondary | ICD-10-CM | POA: Diagnosis not present

## 2017-09-23 ENCOUNTER — Other Ambulatory Visit: Payer: Self-pay | Admitting: *Deleted
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
## 2017-10-06 ENCOUNTER — Encounter: Payer: Self-pay | Admitting: Neurology

## 2017-10-07 ENCOUNTER — Ambulatory Visit (INDEPENDENT_AMBULATORY_CARE_PROVIDER_SITE_OTHER): Payer: PPO | Admitting: Neurology

## 2017-10-07 ENCOUNTER — Encounter: Payer: Self-pay | Admitting: Neurology

## 2017-10-07 VITALS — BP 116/79 | HR 71 | Ht 64.0 in | Wt 140.0 lb

## 2017-10-07 DIAGNOSIS — Z8739 Personal history of other diseases of the musculoskeletal system and connective tissue: Secondary | ICD-10-CM | POA: Diagnosis not present

## 2017-10-07 DIAGNOSIS — R5383 Other fatigue: Secondary | ICD-10-CM | POA: Diagnosis not present

## 2017-10-07 DIAGNOSIS — G4719 Other hypersomnia: Secondary | ICD-10-CM | POA: Diagnosis not present

## 2017-10-07 DIAGNOSIS — R0689 Other abnormalities of breathing: Secondary | ICD-10-CM | POA: Diagnosis not present

## 2017-10-07 DIAGNOSIS — F329 Major depressive disorder, single episode, unspecified: Secondary | ICD-10-CM | POA: Insufficient documentation

## 2017-10-07 DIAGNOSIS — F32A Depression, unspecified: Secondary | ICD-10-CM | POA: Insufficient documentation

## 2017-10-07 DIAGNOSIS — F17210 Nicotine dependence, cigarettes, uncomplicated: Secondary | ICD-10-CM

## 2017-10-07 NOTE — Progress Notes (Signed)
SLEEP MEDICINE CLINIC   Provider:  Larey Seat, [place] D  Primary Care Physician:  Merrilee Seashore, MD    Chief Complaint  Patient presents with  . New Patient (Initial Visit)    pt alone, rm 10. pt states that her energy levels are decreased. she complains of being tired all the time despite 12 hours of sleep. her friend noticed she had apnea spells and teeth grinding. she notices that she inhales. she states that this has been going on for about 4 months. pt states that she has never had a sleep study.     HPI:  Amanda Kim is a 72 y.o. female patient , seen here in a referral from Dr. Ashby Dawes, and to be evaluated for daytime sleepiness and fatigue- with a rather recent onset 4-5 month ago.   Chief complaint according to patient : " I am ruminating in my mind- unable to sleep well ".  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 Goes to bed early, takes once a week one Oxycodone tablet  at night for chroinc back pain.  Her sleep is fragmanted - She has been in various pain treatments, epidural injections. She has participated in clinical trials.  She recently shared a hotel room with a colleague who reported that she held her breath during sleep.   Sleep habits are as follows: Eats dinner at 6 PM, sometimes with wine. She is on her I-pad or lab top for several hours. Watches TV , too, but mostly read in a book- with pages. May go to her bedroom at 10-11 but is not asleep before midnight. She may use CBD oil if she hasn't slept within an hour. She does not shower before bedtime. She sleeps with her dog in the same bed, bedroom with air purifier, cool at 70 F, quiet and dark. She has rented a bedroom on airbnb. Once asleep she can stay asleep - but she always moves - changes position. Has one bathroom break at 6 AM, and she rises at 8 AM,  average sleep time 7-8 hours. She can sleep longer if she wants to- she has  irregular assignments and hours.  She feels not refreshed or restored. No headaches, dry mouth sometimes.  Naps in daytime - 2 PM - 3 PM. Yawns often during her lectures or classes.   Sleep medical history and family sleep history:  Hypothyroidism. Smoker, chronic pain . No history of family sleep disorder . Father had bipolar disease, mother drank herself to death.   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 Daughter lives in [place],  Smoking - 10-15 cigarettes of [place] spirit a day. Was on Chantix for 7 month lost 30 pounds but did not quit smoking.  Regular exercises, ETOH red wine 2-3 a week. Caffeine _ 1 mug of coffee-  No sodas, no iced tea.   Review of Systems: Out of a complete 14 system review, the patient complains of only the following symptoms, and all other reviewed systems are negative.  feet hurt, knees hurt, back pain  Epworth sleepiness 11/ 24 score with naps.  , Fatigue severity score 48/ 63   , depression score/  Social History   Socioeconomic  History  . Marital status: Single    Spouse name: Not on file  . Number of children: 2  . Years of education: 1 yr colle  . Highest education level: Not on file  Occupational History  . Occupation: retired-HR  . Occupation: Medical sales representative  Social Needs  . Financial resource strain: Not on file  . Food insecurity:    Worry: Not on file    Inability: Not on file  . Transportation needs:    Medical: Not on file    Non-medical: Not on file  Tobacco Use  . Smoking status: Former Smoker    Packs/day: 0.80    Years: 45.00    Pack years: 36.00    Types: Cigarettes  . Smokeless tobacco: Never Used  . Tobacco comment: Quit smoking on 02/2014  Substance and Sexual Activity  . Alcohol use: Yes     Alcohol/week: 0.0 standard drinks    Comment: red wine couple of times a week  . Drug use: No  . Sexual activity: Yes    Birth control/protection: Post-menopausal  Lifestyle  . Physical activity:    Days per week: Not on file    Minutes per session: Not on file  . Stress: Not on file  Relationships  . Social connections:    Talks on phone: Not on file    Gets together: Not on file    Attends religious service: Not on file    Active member of club or organization: Not on file    Attends meetings of clubs or organizations: Not on file    Relationship status: Not on file  . Intimate partner violence:    Fear of current or ex partner: Not on file    Emotionally abused: Not on file    Physically abused: Not on file    Forced sexual activity: Not on file  Other Topics Concern  . Not on file  Social History Narrative   09/07/12- Pt reports going to an internalist in [place] for chronic disease management.  Educated pt that family doctor and internalist are the same specialty and it would be better if only one doctor was managing her chronic disease.  Pt said she would like to stay with both and use [place] for acute visits.          Health Care POA: Sister, Pearson Forster   Emergency Contact: Dianne Dun, [phone]   End of Life Plan:    Who lives with you: self   Any pets: none   Diet: Pt has a varied diet of protein, starch and vegetables.  Pt is currently on lower carb diet for weight loss.   Exercise: Pt exercises with friend 5-6x a week.   Seatbelts: Pt reports wearing seatbelt when in vehicles.    Nancy Fetter Exposure/Protection: Pt reports using sunscreen daily.   Hobbies: reading, gardening, decorating, art, walking, movies                      Family History  Problem Relation Age of Onset  . Hypertension Mother   . Alcohol abuse Mother   . Depression Father   . Bipolar disorder Father   . Colon cancer Paternal Grandmother   . Cancer Paternal Grandmother         colon  . Heart attack Paternal Grandfather   . Heart disease Paternal Uncle   . Hyperlipidemia Paternal Uncle     Past Medical History:  Diagnosis Date  . Back  pain    d/t horse fell on pt in the 80's  . Bone spur    in neck(since 1990) and 1st 3 fingers on right hand go numb  . Chronic pain    d/t horse accident in the 80's  . Headache(784.0)    tension  . History of colon polyps   . Hyperlipidemia    takes Fish Oil and CoQ10  . Hypertension    takes Inderal daily  . Hypothyroid 05/07/2011   takes Synthroid daily  . Leg cramps    takes Potassium prn  . Liver disease    nonalcholic fatty liver disease  . Nausea & vomiting    phenergan and zofran prn  . Spondylolisthesis   . Spondylosis of thoracolumbar region without myelopathy or radiculopathy    spine  . Urinary frequency     Past Surgical History:  Procedure Laterality Date  . [place]  . CHOLECYSTECTOMY N/A 05/20/2012   Procedure: LAPAROSCOPIC CHOLECYSTECTOMY WITH INTRAOPERATIVE CHOLANGIOGRAM;  Surgeon: Merrie Roof, MD;  Location: [facility];  Service: General;  Laterality: N/A;  . COLONOSCOPY    . TUBAL LIGATION  1981    Current Outpatient Medications  Medication Sig Dispense Refill  . buPROPion (WELLBUTRIN SR) 150 MG 12 hr tablet Take 150 mg by mouth 2 (two) times daily.    . cyclobenzaprine (FLEXERIL) 10 MG tablet Take 1 tablet (10 mg total) by mouth 2 (two) times daily as needed for muscle spasms. 10 tablet 0  . Estradiol (VAGIFEM) 10 MCG TABS vaginal tablet Place 1 tablet vaginally 2 (two) times a week.    . levothyroxine (SYNTHROID, LEVOTHROID) 100 MCG tablet Take 100 mcg by mouth every morning.  0  . losartan (COZAAR) 100 MG tablet Take 100 mg by mouth daily.    . Nutritional Supplements (DHEA PO) Take 1 tablet by mouth daily.    . propranolol (INDERAL) 10 MG tablet Take 1 tablet (10 mg total) by mouth 2 (two) times daily. 180 tablet 3  . TURMERIC PO Take 1 capsule by mouth 2 (two)  times daily.    . valACYclovir (VALTREX) 1000 MG tablet TK 1 T PO BID  2   No current facility-administered medications for this visit.     Allergies as of 10/07/2017 - Review Complete 10/07/2017  Allergen Reaction Noted  . Sulfisoxazole Itching 10/10/2011  . Bee venom Itching, Swelling, and Rash 12/19/2015  . Penicillins Itching and Swelling 05/20/2011    Vitals: BP 116/79   Pulse 71   Ht 5\' 4"  (1.626 m)   Wt 140 lb (63.5 kg)   BMI 24.03 kg/m  Last Weight:  Wt Readings from Last 1 Encounters:  10/07/17 140 lb (63.5 kg)   EXH:BZJI mass index is 24.03 kg/m.     Last Height:   Ht Readings from Last 1 Encounters:  10/07/17 5\' 4"  (1.626 m)    Physical exam:  General: The patient is awake, alert and appears not in acute distress. The patient is well groomed. Head: Normocephalic, atraumatic. Neck is supple. Mallampati 2 neck circumference 13. 5" . Nasal airflow patent , TMJ - no click evident. Cardiovascular:  Regular rate and rhythm , without  murmurs or carotid bruit, and without distended neck veins. Respiratory: Lungs are clear to auscultation. Skin:  Without evidence of edema, or rash Trunk: BMI is 24.3. The patient's posture is erect.   Neurologic exam : The patient is awake and alert, oriented to place and time.  Memory subjective described as intact.  MMSE: MMSE - Mini Mental State Exam 06/18/2011  Orientation to time 5  Orientation to Place 5  Registration 3  Attention/ Calculation 5  Recall 3  Language- name 2 objects 2  Language- repeat 1  Language- follow 3 step command 3  Language- read & follow direction 1  Write a sentence 1  Copy design 1  Total score 30   Attention span & concentration ability appears normal.  Speech is fluent,  without dysarthria, dysphonia or aphasia.  Mood and affect are appropriate.  Cranial nerves: Pupils are equal and briskly reactive to light. Extraocular movements  in vertical and horizontal planes intact and without  nystagmus. Visual fields by finger perimetry are intact. Hearing to finger rub intact.   Facial sensation intact to fine touch.  Facial motor strength is symmetric and tongue and uvula move midline. Shoulder shrug was symmetrical.   Motor exam: Normal tone, muscle bulk and symmetric strength in all extremities.Sensory:  Fine touch, pinprick and vibration were tested in all extremities. Proprioception tested in the upper extremities was normal.Coordination: Rapid alternating movements/ Finger-to-nose maneuver were normal without evidence of ataxia, dysmetria or tremor.Gait and station: Patient walks without assistive device and is able unassisted to climb up to the exam table. Strength within normal limits.Stance is stable and normal. Turns with 3 Steps.Deep tendon reflexes: in the  upper and lower extremities are symmetric and intact.   Assessment:  After physical and neurologic examination, review of laboratory studies,  Personal review of imaging studies, reports of other /same  Imaging studies, results of polysomnography and / or neurophysiology testing and pre-existing records as far as provided in visit., my assessment is:   1)  Irregular sleep habits , times and changing work hours.  Insomnia is related to sleep onset latency, not the continuum of sleep.  She is excessively sleepy ," naps almost daily- can't help it ".    2)  Restless sleep but she does not confirm fragmentation of sleep- she sleeps through.    3) breath holding spells - could be related to central apnea. She is not sure if these occurred after a pain pill/  Narcotic medication.     The patient was advised of the nature of the diagnosed disorder, the treatment options and the  risks for general health and wellness arising from not treating the condition.  I spent more than 50 minutes of face to face time with the patient. Greater than 50% of time was spent in counseling and coordination of care. We have discussed the  diagnosis and differential and I answered the patient's questions.    Plan:  Treatment plan and additional workup :  Suspect central apnea- will order a sleep study, she is supposed to bring her medication with her.     Larey Seat, MD 6/37/8588, 5:02 PM  Certified in Neurology by ABPN Certified in [place] by [facility] [address] [place], [place] [address]

## 2017-10-07 NOTE — Patient Instructions (Signed)

## 2017-10-29 ENCOUNTER — Ambulatory Visit (INDEPENDENT_AMBULATORY_CARE_PROVIDER_SITE_OTHER): Payer: PPO | Admitting: Neurology

## 2017-10-29 DIAGNOSIS — R0689 Other abnormalities of breathing: Secondary | ICD-10-CM

## 2017-10-29 DIAGNOSIS — F329 Major depressive disorder, single episode, unspecified: Secondary | ICD-10-CM

## 2017-10-29 DIAGNOSIS — F32A Depression, unspecified: Secondary | ICD-10-CM

## 2017-10-29 DIAGNOSIS — G4710 Hypersomnia, unspecified: Secondary | ICD-10-CM

## 2017-10-29 DIAGNOSIS — G4719 Other hypersomnia: Secondary | ICD-10-CM

## 2017-10-29 DIAGNOSIS — R5383 Other fatigue: Secondary | ICD-10-CM

## 2017-10-29 DIAGNOSIS — F17210 Nicotine dependence, cigarettes, uncomplicated: Secondary | ICD-10-CM

## 2017-10-29 DIAGNOSIS — Z8739 Personal history of other diseases of the musculoskeletal system and connective tissue: Secondary | ICD-10-CM

## 2017-11-03 DIAGNOSIS — R7309 Other abnormal glucose: Secondary | ICD-10-CM | POA: Diagnosis not present

## 2017-11-03 DIAGNOSIS — E039 Hypothyroidism, unspecified: Secondary | ICD-10-CM | POA: Diagnosis not present

## 2017-11-03 DIAGNOSIS — K760 Fatty (change of) liver, not elsewhere classified: Secondary | ICD-10-CM | POA: Diagnosis not present

## 2017-11-04 DIAGNOSIS — M545 Low back pain: Secondary | ICD-10-CM | POA: Diagnosis not present

## 2017-11-04 DIAGNOSIS — M47817 Spondylosis without myelopathy or radiculopathy, lumbosacral region: Secondary | ICD-10-CM | POA: Diagnosis not present

## 2017-11-04 DIAGNOSIS — M5416 Radiculopathy, lumbar region: Secondary | ICD-10-CM | POA: Diagnosis not present

## 2017-11-08 NOTE — Procedures (Signed)
PATIENT'S NAME: Amanda Kim, Amanda Kim DOB:      06-07-1945      MR#:    [id number]     DATE OF RECORDING: 10/29/2017 REFERRING M.D.:  Merrilee Seashore, M.D. Study Performed:   Baseline Polysomnogram HISTORY:  Amanda Kim is a 72 y.o. female patient seen on 10-07-2017 in a referral from Dr. Ashby Dawes to be evaluated for daytime sleepiness and fatigue- with a rather recent onset 4-5 month ago. She reports ruminating thoughts, difficulties to sleep well, and considers herself a "night owl". She has Bruxism and snores, has been witnessed to have apnea. She has chronic and intermitted back pain but rarely takes narcotic pain medication. She smokes, and reports a very high degree of fatigue. NASH, Headaches and hypothyroidism listed in PMHx.   The patient endorsed the Epworth Sleepiness Scale at 11 points, the FSS at 48/63 points.   The patient's weight 140 pounds with a height of 64 (inches), resulting in a BMI of 24.1 kg/m2. The patient's neck circumference measured 13.5 inches.  CURRENT MEDICATIONS: Wellbutrin, Flexeril, Vagifem, Synthroid, Synthroid, Cozaar, DHEA supplement, Inderal, Turmeric, Valtrex.   PROCEDURE:  This is a multichannel digital polysomnogram utilizing the Somnostar 11.2 system.  Electrodes and sensors were applied and monitored per AASM Specifications.   EEG, EOG, Chin and Limb EMG, were sampled at 200 Hz.  ECG, Snore and Nasal Pressure, Thermal Airflow, Respiratory Effort, CPAP Flow and Pressure, Oximetry was sampled at 50 Hz. Digital video and audio were recorded.      BASELINE STUDY: Lights Out was at 21:48 and Lights On at 05:03.  Total recording time (TRT) was 435 minutes, with a total sleep time (TST) of 305 minutes. The patient's sleep latency was 150.5 minutes.  REM latency was 87.5 minutes.  The sleep efficiency was 70.1 %.     SLEEP ARCHITECTURE: WASO (Wake after sleep onset) was 22 minutes. There were 7.5 minutes in Stage N1, 172 minutes Stage N2, 46.5 minutes  Stage N3 and 79 minutes in Stage REM.  The percentage of Stage N1 was 2.5%, Stage N2 was 56.4%, Stage N3 was 15.2% and Stage R (REM sleep) was 25.9%.   RESPIRATORY ANALYSIS:  There were a total of 18 respiratory events:  3 obstructive apneas, 1 central apnea and 0 mixed apneas with a total of 4 apneas and an apnea index (AI) of .8 /hour. There were 14 hypopneas with a hypopnea index of 2.8 /hour. The patient also had 0 respiratory event related arousals (RERAs). The total APNEA/HYPOPNEA INDEX (AHI) was 3.5 /hour and the total RESPIRATORY DISTURBANCE INDEX was 3.5 /hour.  12 events occurred in REM sleep and 5 events in NREM. The REM AHI was 9.1 /hour, versus a non-REM AHI of 1.6. The patient spent 154 minutes of total sleep time in the supine position and 151 minutes in non-supine. The supine AHI was 6.7 versus a non-supine AHI of 0.4.  OXYGEN SATURATION & C02:  The Wake baseline 02 saturation was 96%, with the lowest being 82%. Time spent below 89% saturation equaled 34 minutes.  PERIODIC LIMB MOVEMENTS:  The patient had a total of 0 Periodic Limb Movements.  The arousals were noted as: 57 were spontaneous, 0 were associated with PLMs, and 8 were associated with respiratory events.   Audio and video analysis did not show any abnormal or unusual movements, behaviors, phonations or vocalizations.   The patient took no bathroom breaks. Mild, non-continuous snoring was noted. EKG was in keeping with normal sinus rhythm (NSR).  Post-study, the patient indicated that sleep was surprisingly more refreshing than expected.   IMPRESSION:  1. Much too Mild Obstructive Sleep Apnea (OSA) to call for any intervention. There was prolonged hypoxemia noted in REM sleep. 2. Mild Snoring 3. Many Spontaneous Arousals- patient related these to pain.  4. Sleep onset Bruxism  5. Sinus rhythm EKG, not bradycardic.    RECOMMENDATIONS: Post-study, the patient indicated that sleep was surprisingly more refreshing than  expected.   1. Best therapy for this constellation is treatment of anxiety and stress with behavior modification and smoking cessation.  2. Pain therapy for chronic pain is needed. Consider interventional pain treatment, TENS units, PT.  3. CPAP is not indicated and oxygen supplementation is not needed.   A follow up visit with the sleep clinic is optional.    I certify that I have reviewed the entire raw data recording prior to the issuance of this report in accordance with the Standards of Accreditation of the American Academy of Sleep Medicine (AASM)    Larey Seat, MD    11-08-2017  Diplomat, American Board of Psychiatry and Neurology  Diplomat, American Board of Register Director, [employer] [employer]

## 2017-11-09 ENCOUNTER — Telehealth: Payer: Self-pay | Admitting: Neurology

## 2017-11-09 NOTE — Telephone Encounter (Signed)
-----   Message from Larey Seat, MD sent at 11/08/2017  5:30 PM EDT ----- SPLIT protocol was not implemented, there were not enough apneas/ hypopneas. Only during REM sleep was there an accentuation of the AHI, there was prolonged total desaturation time, but no central apneas. The patient had many spontaneous arousals- and related these to pain.  1. Much too Mild Obstructive Sleep Apnea (OSA) to call for any  intervention. There was prolonged hypoxemia noted in REM sleep. 2. Mild Snoring 3. Many Spontaneous Arousals- patient related these to pain.  4. Sleep onset Bruxism  5. Sinus rhythm EKG, not bradycardic.    RECOMMENDATIONS: Post-study, the patient indicated that sleep was  surprisingly more refreshing than expected.   1. Best therapy for this constellation is treatment of anxiety  and stress with behavior modification and smoking cessation.  2. Pain therapy for chronic pain is needed. Consider  interventional pain treatment, TENS units, PT.  3. CPAP is not indicated and oxygen supplementation is not  needed.   A follow up visit with the sleep clinic is optional. Snoring treatment could be achieved by a dental device.

## 2017-11-09 NOTE — Telephone Encounter (Signed)
Called patient to discuss sleep study results. No answer at this time. LVM for the patient to call back.   

## 2017-11-10 ENCOUNTER — Encounter: Payer: Self-pay | Admitting: Neurology

## 2017-11-10 DIAGNOSIS — R7309 Other abnormal glucose: Secondary | ICD-10-CM | POA: Diagnosis not present

## 2017-11-10 DIAGNOSIS — E039 Hypothyroidism, unspecified: Secondary | ICD-10-CM | POA: Diagnosis not present

## 2017-11-10 DIAGNOSIS — Z23 Encounter for immunization: Secondary | ICD-10-CM | POA: Diagnosis not present

## 2017-11-10 DIAGNOSIS — F1190 Opioid use, unspecified, uncomplicated: Secondary | ICD-10-CM | POA: Diagnosis not present

## 2017-12-02 DIAGNOSIS — N390 Urinary tract infection, site not specified: Secondary | ICD-10-CM | POA: Diagnosis not present

## 2017-12-02 DIAGNOSIS — R300 Dysuria: Secondary | ICD-10-CM | POA: Diagnosis not present

## 2018-01-19 DIAGNOSIS — M545 Low back pain: Secondary | ICD-10-CM | POA: Diagnosis not present

## 2018-01-19 DIAGNOSIS — M47817 Spondylosis without myelopathy or radiculopathy, lumbosacral region: Secondary | ICD-10-CM | POA: Diagnosis not present

## 2018-01-29 DIAGNOSIS — M545 Low back pain: Secondary | ICD-10-CM | POA: Diagnosis not present

## 2018-01-29 DIAGNOSIS — M47817 Spondylosis without myelopathy or radiculopathy, lumbosacral region: Secondary | ICD-10-CM | POA: Diagnosis not present

## 2018-02-16 DIAGNOSIS — Z79899 Other long term (current) drug therapy: Secondary | ICD-10-CM | POA: Diagnosis not present

## 2018-02-16 DIAGNOSIS — M545 Low back pain: Secondary | ICD-10-CM | POA: Diagnosis not present

## 2018-02-16 DIAGNOSIS — G8929 Other chronic pain: Secondary | ICD-10-CM | POA: Diagnosis not present

## 2018-02-16 DIAGNOSIS — M129 Arthropathy, unspecified: Secondary | ICD-10-CM | POA: Diagnosis not present

## 2018-02-23 DIAGNOSIS — K760 Fatty (change of) liver, not elsewhere classified: Secondary | ICD-10-CM | POA: Diagnosis not present

## 2018-02-23 DIAGNOSIS — R7309 Other abnormal glucose: Secondary | ICD-10-CM | POA: Diagnosis not present

## 2018-02-23 DIAGNOSIS — E039 Hypothyroidism, unspecified: Secondary | ICD-10-CM | POA: Diagnosis not present

## 2018-02-23 DIAGNOSIS — M150 Primary generalized (osteo)arthritis: Secondary | ICD-10-CM | POA: Diagnosis not present

## 2018-02-24 DIAGNOSIS — M47817 Spondylosis without myelopathy or radiculopathy, lumbosacral region: Secondary | ICD-10-CM | POA: Diagnosis not present

## 2018-02-24 DIAGNOSIS — M545 Low back pain: Secondary | ICD-10-CM | POA: Diagnosis not present

## 2018-03-10 DIAGNOSIS — I10 Essential (primary) hypertension: Secondary | ICD-10-CM | POA: Diagnosis not present

## 2018-03-10 DIAGNOSIS — Z1211 Encounter for screening for malignant neoplasm of colon: Secondary | ICD-10-CM | POA: Diagnosis not present

## 2018-03-10 DIAGNOSIS — R197 Diarrhea, unspecified: Secondary | ICD-10-CM | POA: Diagnosis not present

## 2018-03-16 DIAGNOSIS — Z79899 Other long term (current) drug therapy: Secondary | ICD-10-CM | POA: Diagnosis not present

## 2018-03-16 DIAGNOSIS — G8929 Other chronic pain: Secondary | ICD-10-CM | POA: Diagnosis not present

## 2018-03-16 DIAGNOSIS — G894 Chronic pain syndrome: Secondary | ICD-10-CM | POA: Diagnosis not present

## 2018-03-16 DIAGNOSIS — M545 Low back pain: Secondary | ICD-10-CM | POA: Diagnosis not present

## 2018-04-06 DIAGNOSIS — R1084 Generalized abdominal pain: Secondary | ICD-10-CM | POA: Diagnosis not present

## 2018-04-06 DIAGNOSIS — D122 Benign neoplasm of ascending colon: Secondary | ICD-10-CM | POA: Diagnosis not present

## 2018-04-06 DIAGNOSIS — K635 Polyp of colon: Secondary | ICD-10-CM | POA: Diagnosis not present

## 2018-04-06 DIAGNOSIS — K5730 Diverticulosis of large intestine without perforation or abscess without bleeding: Secondary | ICD-10-CM | POA: Diagnosis not present

## 2018-04-06 DIAGNOSIS — Z1211 Encounter for screening for malignant neoplasm of colon: Secondary | ICD-10-CM | POA: Diagnosis not present

## 2018-04-19 ENCOUNTER — Other Ambulatory Visit: Payer: Self-pay | Admitting: Internal Medicine

## 2018-04-19 DIAGNOSIS — Z1231 Encounter for screening mammogram for malignant neoplasm of breast: Secondary | ICD-10-CM

## 2018-04-20 DIAGNOSIS — F329 Major depressive disorder, single episode, unspecified: Secondary | ICD-10-CM | POA: Diagnosis not present

## 2018-04-20 DIAGNOSIS — K2900 Acute gastritis without bleeding: Secondary | ICD-10-CM | POA: Diagnosis not present

## 2018-04-28 DIAGNOSIS — L600 Ingrowing nail: Secondary | ICD-10-CM | POA: Diagnosis not present

## 2018-04-28 DIAGNOSIS — M79675 Pain in left toe(s): Secondary | ICD-10-CM | POA: Diagnosis not present

## 2018-04-28 DIAGNOSIS — M79674 Pain in right toe(s): Secondary | ICD-10-CM | POA: Diagnosis not present

## 2018-05-11 DIAGNOSIS — G894 Chronic pain syndrome: Secondary | ICD-10-CM | POA: Diagnosis not present

## 2018-05-11 DIAGNOSIS — F1120 Opioid dependence, uncomplicated: Secondary | ICD-10-CM | POA: Diagnosis not present

## 2018-05-11 DIAGNOSIS — G8929 Other chronic pain: Secondary | ICD-10-CM | POA: Diagnosis not present

## 2018-05-11 DIAGNOSIS — Z79899 Other long term (current) drug therapy: Secondary | ICD-10-CM | POA: Diagnosis not present

## 2018-05-11 DIAGNOSIS — M545 Low back pain: Secondary | ICD-10-CM | POA: Diagnosis not present

## 2018-05-14 ENCOUNTER — Ambulatory Visit: Payer: PPO

## 2018-06-01 DIAGNOSIS — K760 Fatty (change of) liver, not elsewhere classified: Secondary | ICD-10-CM | POA: Diagnosis not present

## 2018-06-01 DIAGNOSIS — Z1212 Encounter for screening for malignant neoplasm of rectum: Secondary | ICD-10-CM | POA: Diagnosis not present

## 2018-06-01 DIAGNOSIS — R7309 Other abnormal glucose: Secondary | ICD-10-CM | POA: Diagnosis not present

## 2018-06-01 DIAGNOSIS — M150 Primary generalized (osteo)arthritis: Secondary | ICD-10-CM | POA: Diagnosis not present

## 2018-06-01 DIAGNOSIS — L309 Dermatitis, unspecified: Secondary | ICD-10-CM | POA: Diagnosis not present

## 2018-06-01 DIAGNOSIS — E039 Hypothyroidism, unspecified: Secondary | ICD-10-CM | POA: Diagnosis not present

## 2018-06-07 ENCOUNTER — Encounter: Payer: Self-pay | Admitting: Neurology

## 2018-06-08 ENCOUNTER — Ambulatory Visit: Payer: PPO

## 2018-06-08 DIAGNOSIS — J449 Chronic obstructive pulmonary disease, unspecified: Secondary | ICD-10-CM | POA: Diagnosis not present

## 2018-06-08 DIAGNOSIS — E039 Hypothyroidism, unspecified: Secondary | ICD-10-CM | POA: Diagnosis not present

## 2018-06-08 DIAGNOSIS — I700 Atherosclerosis of aorta: Secondary | ICD-10-CM | POA: Diagnosis not present

## 2018-06-08 DIAGNOSIS — Z0000 Encounter for general adult medical examination without abnormal findings: Secondary | ICD-10-CM | POA: Diagnosis not present

## 2018-06-08 DIAGNOSIS — S93401A Sprain of unspecified ligament of right ankle, initial encounter: Secondary | ICD-10-CM | POA: Diagnosis not present

## 2018-06-08 DIAGNOSIS — K760 Fatty (change of) liver, not elsewhere classified: Secondary | ICD-10-CM | POA: Diagnosis not present

## 2018-06-08 DIAGNOSIS — F1190 Opioid use, unspecified, uncomplicated: Secondary | ICD-10-CM | POA: Diagnosis not present

## 2018-06-08 DIAGNOSIS — R7309 Other abnormal glucose: Secondary | ICD-10-CM | POA: Diagnosis not present

## 2018-06-08 DIAGNOSIS — W57XXXA Bitten or stung by nonvenomous insect and other nonvenomous arthropods, initial encounter: Secondary | ICD-10-CM | POA: Diagnosis not present

## 2018-06-08 DIAGNOSIS — G4733 Obstructive sleep apnea (adult) (pediatric): Secondary | ICD-10-CM | POA: Diagnosis not present

## 2018-06-08 DIAGNOSIS — F17210 Nicotine dependence, cigarettes, uncomplicated: Secondary | ICD-10-CM | POA: Diagnosis not present

## 2018-06-08 DIAGNOSIS — I10 Essential (primary) hypertension: Secondary | ICD-10-CM | POA: Diagnosis not present

## 2018-06-21 DIAGNOSIS — H40053 Ocular hypertension, bilateral: Secondary | ICD-10-CM | POA: Diagnosis not present

## 2018-06-21 DIAGNOSIS — H40013 Open angle with borderline findings, low risk, bilateral: Secondary | ICD-10-CM | POA: Diagnosis not present

## 2018-06-21 DIAGNOSIS — H43393 Other vitreous opacities, bilateral: Secondary | ICD-10-CM | POA: Diagnosis not present

## 2018-06-21 DIAGNOSIS — H25013 Cortical age-related cataract, bilateral: Secondary | ICD-10-CM | POA: Diagnosis not present

## 2018-07-05 DIAGNOSIS — G894 Chronic pain syndrome: Secondary | ICD-10-CM | POA: Diagnosis not present

## 2018-07-05 DIAGNOSIS — M545 Low back pain: Secondary | ICD-10-CM | POA: Diagnosis not present

## 2018-07-05 DIAGNOSIS — G8929 Other chronic pain: Secondary | ICD-10-CM | POA: Diagnosis not present

## 2018-07-05 DIAGNOSIS — Z79899 Other long term (current) drug therapy: Secondary | ICD-10-CM | POA: Diagnosis not present

## 2018-07-06 DIAGNOSIS — M545 Low back pain: Secondary | ICD-10-CM | POA: Diagnosis not present

## 2018-07-06 DIAGNOSIS — M47817 Spondylosis without myelopathy or radiculopathy, lumbosacral region: Secondary | ICD-10-CM | POA: Diagnosis not present

## 2018-07-06 DIAGNOSIS — M5416 Radiculopathy, lumbar region: Secondary | ICD-10-CM | POA: Diagnosis not present

## 2018-07-08 DIAGNOSIS — M5416 Radiculopathy, lumbar region: Secondary | ICD-10-CM | POA: Diagnosis not present

## 2018-07-08 DIAGNOSIS — M47817 Spondylosis without myelopathy or radiculopathy, lumbosacral region: Secondary | ICD-10-CM | POA: Diagnosis not present

## 2018-07-08 DIAGNOSIS — M545 Low back pain: Secondary | ICD-10-CM | POA: Diagnosis not present

## 2018-07-15 DIAGNOSIS — R109 Unspecified abdominal pain: Secondary | ICD-10-CM | POA: Diagnosis not present

## 2018-07-15 DIAGNOSIS — Z7189 Other specified counseling: Secondary | ICD-10-CM | POA: Diagnosis not present

## 2018-07-15 DIAGNOSIS — I10 Essential (primary) hypertension: Secondary | ICD-10-CM | POA: Diagnosis not present

## 2018-07-16 DIAGNOSIS — R1031 Right lower quadrant pain: Secondary | ICD-10-CM | POA: Diagnosis not present

## 2018-07-16 DIAGNOSIS — R1084 Generalized abdominal pain: Secondary | ICD-10-CM | POA: Diagnosis not present

## 2018-07-21 ENCOUNTER — Other Ambulatory Visit: Payer: Self-pay

## 2018-07-21 ENCOUNTER — Ambulatory Visit
Admission: RE | Admit: 2018-07-21 | Discharge: 2018-07-21 | Disposition: A | Discharge status: Home / Self Care | Payer: PPO | Source: Ambulatory Visit | Attending: Internal Medicine | Admitting: Internal Medicine

## 2018-07-21 DIAGNOSIS — Z1231 Encounter for screening mammogram for malignant neoplasm of breast: Secondary | ICD-10-CM

## 2018-07-27 DIAGNOSIS — M545 Low back pain: Secondary | ICD-10-CM | POA: Diagnosis not present

## 2018-07-27 DIAGNOSIS — M47817 Spondylosis without myelopathy or radiculopathy, lumbosacral region: Secondary | ICD-10-CM | POA: Diagnosis not present

## 2018-07-27 DIAGNOSIS — E039 Hypothyroidism, unspecified: Secondary | ICD-10-CM | POA: Diagnosis not present

## 2018-08-03 DIAGNOSIS — E039 Hypothyroidism, unspecified: Secondary | ICD-10-CM | POA: Diagnosis not present

## 2018-08-03 DIAGNOSIS — R7309 Other abnormal glucose: Secondary | ICD-10-CM | POA: Diagnosis not present

## 2018-08-03 DIAGNOSIS — Z7189 Other specified counseling: Secondary | ICD-10-CM | POA: Diagnosis not present

## 2018-08-12 DIAGNOSIS — R197 Diarrhea, unspecified: Secondary | ICD-10-CM | POA: Diagnosis not present

## 2018-09-10 DIAGNOSIS — Z79899 Other long term (current) drug therapy: Secondary | ICD-10-CM | POA: Diagnosis not present

## 2018-09-10 DIAGNOSIS — G8929 Other chronic pain: Secondary | ICD-10-CM | POA: Diagnosis not present

## 2018-09-10 DIAGNOSIS — G894 Chronic pain syndrome: Secondary | ICD-10-CM | POA: Diagnosis not present

## 2018-09-10 DIAGNOSIS — Z760 Encounter for issue of repeat prescription: Secondary | ICD-10-CM | POA: Diagnosis not present

## 2018-09-10 DIAGNOSIS — M545 Low back pain: Secondary | ICD-10-CM | POA: Diagnosis not present

## 2018-09-13 DIAGNOSIS — D225 Melanocytic nevi of trunk: Secondary | ICD-10-CM | POA: Diagnosis not present

## 2018-09-13 DIAGNOSIS — L821 Other seborrheic keratosis: Secondary | ICD-10-CM | POA: Diagnosis not present

## 2018-09-13 DIAGNOSIS — Z85828 Personal history of other malignant neoplasm of skin: Secondary | ICD-10-CM | POA: Diagnosis not present

## 2018-09-13 DIAGNOSIS — L814 Other melanin hyperpigmentation: Secondary | ICD-10-CM | POA: Diagnosis not present

## 2018-09-13 DIAGNOSIS — L700 Acne vulgaris: Secondary | ICD-10-CM | POA: Diagnosis not present

## 2018-09-14 DIAGNOSIS — M545 Low back pain: Secondary | ICD-10-CM | POA: Diagnosis not present

## 2018-09-14 DIAGNOSIS — G894 Chronic pain syndrome: Secondary | ICD-10-CM | POA: Diagnosis not present

## 2018-10-15 DIAGNOSIS — G894 Chronic pain syndrome: Secondary | ICD-10-CM | POA: Diagnosis not present

## 2018-10-15 DIAGNOSIS — M545 Low back pain: Secondary | ICD-10-CM | POA: Diagnosis not present

## 2018-10-26 DIAGNOSIS — E039 Hypothyroidism, unspecified: Secondary | ICD-10-CM | POA: Diagnosis not present

## 2018-10-26 DIAGNOSIS — I10 Essential (primary) hypertension: Secondary | ICD-10-CM | POA: Diagnosis not present

## 2018-11-02 DIAGNOSIS — R7309 Other abnormal glucose: Secondary | ICD-10-CM | POA: Diagnosis not present

## 2018-11-02 DIAGNOSIS — Z7189 Other specified counseling: Secondary | ICD-10-CM | POA: Diagnosis not present

## 2018-11-02 DIAGNOSIS — E039 Hypothyroidism, unspecified: Secondary | ICD-10-CM | POA: Diagnosis not present

## 2018-11-02 DIAGNOSIS — Z23 Encounter for immunization: Secondary | ICD-10-CM | POA: Diagnosis not present

## 2018-11-02 DIAGNOSIS — J449 Chronic obstructive pulmonary disease, unspecified: Secondary | ICD-10-CM | POA: Diagnosis not present

## 2018-11-02 DIAGNOSIS — I700 Atherosclerosis of aorta: Secondary | ICD-10-CM | POA: Diagnosis not present

## 2018-11-11 DIAGNOSIS — G894 Chronic pain syndrome: Secondary | ICD-10-CM | POA: Diagnosis not present

## 2018-11-11 DIAGNOSIS — Z79899 Other long term (current) drug therapy: Secondary | ICD-10-CM | POA: Diagnosis not present

## 2018-11-11 DIAGNOSIS — G8929 Other chronic pain: Secondary | ICD-10-CM | POA: Diagnosis not present

## 2018-11-11 DIAGNOSIS — M545 Low back pain: Secondary | ICD-10-CM | POA: Diagnosis not present

## 2018-11-14 DIAGNOSIS — M545 Low back pain: Secondary | ICD-10-CM | POA: Diagnosis not present

## 2018-11-14 DIAGNOSIS — G894 Chronic pain syndrome: Secondary | ICD-10-CM | POA: Diagnosis not present

## 2018-11-15 DIAGNOSIS — G894 Chronic pain syndrome: Secondary | ICD-10-CM | POA: Diagnosis not present

## 2018-11-15 DIAGNOSIS — M545 Low back pain: Secondary | ICD-10-CM | POA: Diagnosis not present

## 2018-12-14 DIAGNOSIS — G894 Chronic pain syndrome: Secondary | ICD-10-CM | POA: Diagnosis not present

## 2018-12-14 DIAGNOSIS — M545 Low back pain: Secondary | ICD-10-CM | POA: Diagnosis not present

## 2018-12-15 DIAGNOSIS — H40053 Ocular hypertension, bilateral: Secondary | ICD-10-CM | POA: Diagnosis not present

## 2018-12-15 DIAGNOSIS — G894 Chronic pain syndrome: Secondary | ICD-10-CM | POA: Diagnosis not present

## 2018-12-15 DIAGNOSIS — M545 Low back pain: Secondary | ICD-10-CM | POA: Diagnosis not present

## 2018-12-15 DIAGNOSIS — H40013 Open angle with borderline findings, low risk, bilateral: Secondary | ICD-10-CM | POA: Diagnosis not present

## 2018-12-22 DIAGNOSIS — M47817 Spondylosis without myelopathy or radiculopathy, lumbosacral region: Secondary | ICD-10-CM | POA: Diagnosis not present

## 2018-12-22 DIAGNOSIS — M545 Low back pain: Secondary | ICD-10-CM | POA: Diagnosis not present

## 2018-12-27 DIAGNOSIS — M545 Low back pain: Secondary | ICD-10-CM | POA: Diagnosis not present

## 2018-12-30 DIAGNOSIS — N390 Urinary tract infection, site not specified: Secondary | ICD-10-CM | POA: Diagnosis not present

## 2018-12-30 DIAGNOSIS — R350 Frequency of micturition: Secondary | ICD-10-CM | POA: Diagnosis not present

## 2019-01-07 DIAGNOSIS — Z20828 Contact with and (suspected) exposure to other viral communicable diseases: Secondary | ICD-10-CM | POA: Diagnosis not present

## 2019-01-07 DIAGNOSIS — Z7189 Other specified counseling: Secondary | ICD-10-CM | POA: Diagnosis not present

## 2019-01-08 DIAGNOSIS — Z03818 Encounter for observation for suspected exposure to other biological agents ruled out: Secondary | ICD-10-CM | POA: Diagnosis not present

## 2019-01-11 DIAGNOSIS — G894 Chronic pain syndrome: Secondary | ICD-10-CM | POA: Diagnosis not present

## 2019-01-11 DIAGNOSIS — Z79899 Other long term (current) drug therapy: Secondary | ICD-10-CM | POA: Diagnosis not present

## 2019-01-11 DIAGNOSIS — M545 Low back pain: Secondary | ICD-10-CM | POA: Diagnosis not present

## 2019-01-11 DIAGNOSIS — G8929 Other chronic pain: Secondary | ICD-10-CM | POA: Diagnosis not present

## 2019-01-15 DIAGNOSIS — G894 Chronic pain syndrome: Secondary | ICD-10-CM | POA: Diagnosis not present

## 2019-01-15 DIAGNOSIS — M545 Low back pain: Secondary | ICD-10-CM | POA: Diagnosis not present

## 2019-01-17 DIAGNOSIS — F4323 Adjustment disorder with mixed anxiety and depressed mood: Secondary | ICD-10-CM | POA: Diagnosis not present

## 2019-01-24 DIAGNOSIS — M47817 Spondylosis without myelopathy or radiculopathy, lumbosacral region: Secondary | ICD-10-CM | POA: Diagnosis not present

## 2019-01-24 DIAGNOSIS — M545 Low back pain: Secondary | ICD-10-CM | POA: Diagnosis not present

## 2019-01-30 DIAGNOSIS — G894 Chronic pain syndrome: Secondary | ICD-10-CM | POA: Diagnosis not present

## 2019-01-30 DIAGNOSIS — M545 Low back pain: Secondary | ICD-10-CM | POA: Diagnosis not present

## 2019-02-02 DIAGNOSIS — F4323 Adjustment disorder with mixed anxiety and depressed mood: Secondary | ICD-10-CM | POA: Diagnosis not present

## 2019-02-14 DIAGNOSIS — M545 Low back pain: Secondary | ICD-10-CM | POA: Diagnosis not present

## 2019-02-14 DIAGNOSIS — G894 Chronic pain syndrome: Secondary | ICD-10-CM | POA: Diagnosis not present

## 2019-02-21 DIAGNOSIS — F4323 Adjustment disorder with mixed anxiety and depressed mood: Secondary | ICD-10-CM | POA: Diagnosis not present

## 2019-03-02 DIAGNOSIS — G894 Chronic pain syndrome: Secondary | ICD-10-CM | POA: Diagnosis not present

## 2019-03-02 DIAGNOSIS — F332 Major depressive disorder, recurrent severe without psychotic features: Secondary | ICD-10-CM | POA: Diagnosis not present

## 2019-03-02 DIAGNOSIS — M545 Low back pain: Secondary | ICD-10-CM | POA: Diagnosis not present

## 2019-03-02 DIAGNOSIS — F411 Generalized anxiety disorder: Secondary | ICD-10-CM | POA: Diagnosis not present

## 2019-03-02 DIAGNOSIS — R7309 Other abnormal glucose: Secondary | ICD-10-CM | POA: Diagnosis not present

## 2019-03-02 DIAGNOSIS — J449 Chronic obstructive pulmonary disease, unspecified: Secondary | ICD-10-CM | POA: Diagnosis not present

## 2019-03-02 DIAGNOSIS — F1190 Opioid use, unspecified, uncomplicated: Secondary | ICD-10-CM | POA: Diagnosis not present

## 2019-03-15 DIAGNOSIS — Z79899 Other long term (current) drug therapy: Secondary | ICD-10-CM | POA: Diagnosis not present

## 2019-03-15 DIAGNOSIS — M545 Low back pain: Secondary | ICD-10-CM | POA: Diagnosis not present

## 2019-03-15 DIAGNOSIS — G894 Chronic pain syndrome: Secondary | ICD-10-CM | POA: Diagnosis not present

## 2019-03-15 DIAGNOSIS — G8929 Other chronic pain: Secondary | ICD-10-CM | POA: Diagnosis not present

## 2019-03-21 DIAGNOSIS — F4323 Adjustment disorder with mixed anxiety and depressed mood: Secondary | ICD-10-CM | POA: Diagnosis not present

## 2019-03-23 DIAGNOSIS — R269 Unspecified abnormalities of gait and mobility: Secondary | ICD-10-CM | POA: Diagnosis not present

## 2019-03-23 DIAGNOSIS — N390 Urinary tract infection, site not specified: Secondary | ICD-10-CM | POA: Diagnosis not present

## 2019-03-23 DIAGNOSIS — F332 Major depressive disorder, recurrent severe without psychotic features: Secondary | ICD-10-CM | POA: Diagnosis not present

## 2019-03-28 ENCOUNTER — Ambulatory Visit: Payer: PPO | Attending: Internal Medicine

## 2019-03-28 DIAGNOSIS — Z23 Encounter for immunization: Secondary | ICD-10-CM

## 2019-03-28 NOTE — Progress Notes (Signed)
   Covid-19 Vaccination Clinic  Name:  Amanda Kim    MRN: [id number] DOB: April 07, 1945  03/28/2019  Amanda Kim was observed post Covid-19 immunization for 15 minutes without incidence. She was provided with Vaccine Information Sheet and instruction to access the V-Safe system.   Amanda Kim was instructed to call 911 with any severe reactions post vaccine: Marland Kitchen Difficulty breathing  . Swelling of your face and throat  . A fast heartbeat  . A bad rash all over your body  . Dizziness and weakness    Immunizations Administered    Name Date Dose VIS Date Route   Pfizer COVID-19 Vaccine 03/28/2019  1:18 PM 0.3 mL 01/28/2019 Intramuscular   Manufacturer: [place]   Lot: [id number]   [place]: [id number]

## 2019-04-06 DIAGNOSIS — F4323 Adjustment disorder with mixed anxiety and depressed mood: Secondary | ICD-10-CM | POA: Diagnosis not present

## 2019-04-16 ENCOUNTER — Ambulatory Visit: Payer: PPO

## 2019-04-18 DIAGNOSIS — M545 Low back pain: Secondary | ICD-10-CM | POA: Diagnosis not present

## 2019-04-18 DIAGNOSIS — G894 Chronic pain syndrome: Secondary | ICD-10-CM | POA: Diagnosis not present

## 2019-04-18 DIAGNOSIS — R260 Ataxic gait: Secondary | ICD-10-CM | POA: Diagnosis not present

## 2019-04-18 DIAGNOSIS — Z79899 Other long term (current) drug therapy: Secondary | ICD-10-CM | POA: Diagnosis not present

## 2019-04-18 DIAGNOSIS — E559 Vitamin D deficiency, unspecified: Secondary | ICD-10-CM | POA: Diagnosis not present

## 2019-04-18 DIAGNOSIS — G8929 Other chronic pain: Secondary | ICD-10-CM | POA: Diagnosis not present

## 2019-04-22 ENCOUNTER — Ambulatory Visit: Payer: PPO | Attending: Internal Medicine

## 2019-04-22 DIAGNOSIS — Z23 Encounter for immunization: Secondary | ICD-10-CM | POA: Insufficient documentation

## 2019-04-22 NOTE — Progress Notes (Signed)
   Covid-19 Vaccination Clinic  Name:  Shaniqua Huyser    MRN: [id number] DOB: 03/01/1945  04/22/2019  Ms. Conaty was observed post Covid-19 immunization for 15 minutes without incident. She was provided with Vaccine Information Sheet and instruction to access the V-Safe system.   Ms. Heidecker was instructed to call 911 with any severe reactions post vaccine: Marland Kitchen Difficulty breathing  . Swelling of face and throat  . A fast heartbeat  . A bad rash all over body  . Dizziness and weakness   Immunizations Administered    Name Date Dose VIS Date Route   Pfizer COVID-19 Vaccine 04/22/2019 10:24 AM 0.3 mL 01/28/2019 Intramuscular   Manufacturer: [place]   Lot: [id number]   [place]: [id number]

## 2019-04-27 DIAGNOSIS — F332 Major depressive disorder, recurrent severe without psychotic features: Secondary | ICD-10-CM | POA: Diagnosis not present

## 2019-04-27 DIAGNOSIS — J449 Chronic obstructive pulmonary disease, unspecified: Secondary | ICD-10-CM | POA: Diagnosis not present

## 2019-04-27 DIAGNOSIS — R7309 Other abnormal glucose: Secondary | ICD-10-CM | POA: Diagnosis not present

## 2019-05-06 DIAGNOSIS — F4323 Adjustment disorder with mixed anxiety and depressed mood: Secondary | ICD-10-CM | POA: Diagnosis not present

## 2019-05-19 DIAGNOSIS — G8929 Other chronic pain: Secondary | ICD-10-CM | POA: Diagnosis not present

## 2019-05-19 DIAGNOSIS — G894 Chronic pain syndrome: Secondary | ICD-10-CM | POA: Diagnosis not present

## 2019-05-19 DIAGNOSIS — M545 Low back pain: Secondary | ICD-10-CM | POA: Diagnosis not present

## 2019-05-19 DIAGNOSIS — Z79899 Other long term (current) drug therapy: Secondary | ICD-10-CM | POA: Diagnosis not present

## 2019-05-30 DIAGNOSIS — E039 Hypothyroidism, unspecified: Secondary | ICD-10-CM | POA: Diagnosis not present

## 2019-05-30 DIAGNOSIS — R7309 Other abnormal glucose: Secondary | ICD-10-CM | POA: Diagnosis not present

## 2019-05-30 DIAGNOSIS — E559 Vitamin D deficiency, unspecified: Secondary | ICD-10-CM | POA: Diagnosis not present

## 2019-05-30 DIAGNOSIS — J449 Chronic obstructive pulmonary disease, unspecified: Secondary | ICD-10-CM | POA: Diagnosis not present

## 2019-05-30 DIAGNOSIS — I700 Atherosclerosis of aorta: Secondary | ICD-10-CM | POA: Diagnosis not present

## 2019-05-30 DIAGNOSIS — R1310 Dysphagia, unspecified: Secondary | ICD-10-CM | POA: Diagnosis not present

## 2019-06-06 DIAGNOSIS — F4323 Adjustment disorder with mixed anxiety and depressed mood: Secondary | ICD-10-CM | POA: Diagnosis not present

## 2019-06-09 DIAGNOSIS — J449 Chronic obstructive pulmonary disease, unspecified: Secondary | ICD-10-CM | POA: Diagnosis not present

## 2019-06-09 DIAGNOSIS — E039 Hypothyroidism, unspecified: Secondary | ICD-10-CM | POA: Diagnosis not present

## 2019-06-09 DIAGNOSIS — R7309 Other abnormal glucose: Secondary | ICD-10-CM | POA: Diagnosis not present

## 2019-06-09 DIAGNOSIS — I10 Essential (primary) hypertension: Secondary | ICD-10-CM | POA: Diagnosis not present

## 2019-06-09 DIAGNOSIS — M7712 Lateral epicondylitis, left elbow: Secondary | ICD-10-CM | POA: Diagnosis not present

## 2019-06-09 DIAGNOSIS — I700 Atherosclerosis of aorta: Secondary | ICD-10-CM | POA: Diagnosis not present

## 2019-06-09 DIAGNOSIS — F1190 Opioid use, unspecified, uncomplicated: Secondary | ICD-10-CM | POA: Diagnosis not present

## 2019-06-09 DIAGNOSIS — M810 Age-related osteoporosis without current pathological fracture: Secondary | ICD-10-CM | POA: Diagnosis not present

## 2019-06-09 DIAGNOSIS — Z780 Asymptomatic menopausal state: Secondary | ICD-10-CM | POA: Diagnosis not present

## 2019-06-09 DIAGNOSIS — M25522 Pain in left elbow: Secondary | ICD-10-CM | POA: Diagnosis not present

## 2019-06-09 DIAGNOSIS — M8589 Other specified disorders of bone density and structure, multiple sites: Secondary | ICD-10-CM | POA: Diagnosis not present

## 2019-06-09 DIAGNOSIS — Z0000 Encounter for general adult medical examination without abnormal findings: Secondary | ICD-10-CM | POA: Diagnosis not present

## 2019-06-09 DIAGNOSIS — J432 Centrilobular emphysema: Secondary | ICD-10-CM | POA: Diagnosis not present

## 2019-06-15 DIAGNOSIS — H35033 Hypertensive retinopathy, bilateral: Secondary | ICD-10-CM | POA: Diagnosis not present

## 2019-06-15 DIAGNOSIS — H40013 Open angle with borderline findings, low risk, bilateral: Secondary | ICD-10-CM | POA: Diagnosis not present

## 2019-06-15 DIAGNOSIS — H35363 Drusen (degenerative) of macula, bilateral: Secondary | ICD-10-CM | POA: Diagnosis not present

## 2019-06-15 DIAGNOSIS — H40053 Ocular hypertension, bilateral: Secondary | ICD-10-CM | POA: Diagnosis not present

## 2019-06-20 DIAGNOSIS — G894 Chronic pain syndrome: Secondary | ICD-10-CM | POA: Diagnosis not present

## 2019-06-20 DIAGNOSIS — Z79899 Other long term (current) drug therapy: Secondary | ICD-10-CM | POA: Diagnosis not present

## 2019-06-20 DIAGNOSIS — M545 Low back pain: Secondary | ICD-10-CM | POA: Diagnosis not present

## 2019-06-20 DIAGNOSIS — G8929 Other chronic pain: Secondary | ICD-10-CM | POA: Diagnosis not present

## 2019-06-28 ENCOUNTER — Encounter: Payer: Self-pay | Admitting: Physical Medicine and Rehabilitation

## 2019-06-28 ENCOUNTER — Ambulatory Visit: Payer: PPO | Admitting: Physical Medicine and Rehabilitation

## 2019-06-28 ENCOUNTER — Other Ambulatory Visit: Payer: Self-pay

## 2019-06-28 VITALS — BP 172/94 | HR 60 | Ht 64.0 in | Wt 135.0 lb

## 2019-06-28 DIAGNOSIS — G8929 Other chronic pain: Secondary | ICD-10-CM

## 2019-06-28 DIAGNOSIS — M4316 Spondylolisthesis, lumbar region: Secondary | ICD-10-CM

## 2019-06-28 DIAGNOSIS — G894 Chronic pain syndrome: Secondary | ICD-10-CM

## 2019-06-28 DIAGNOSIS — M5441 Lumbago with sciatica, right side: Secondary | ICD-10-CM

## 2019-06-28 DIAGNOSIS — M47816 Spondylosis without myelopathy or radiculopathy, lumbar region: Secondary | ICD-10-CM

## 2019-06-28 DIAGNOSIS — F1190 Opioid use, unspecified, uncomplicated: Secondary | ICD-10-CM

## 2019-06-28 NOTE — Progress Notes (Signed)
Amanda Kim - 74 y.o. female MRN [id number]  Date of birth: May 04, 1945  Office Visit Note: Visit Date: 06/28/2019 PCP: Merrilee Seashore, MD Referred by: Merrilee Seashore, MD  Subjective: Chief Complaint  Patient presents with  . Lower Back - Pain  . Right Leg - Pain   HPI: Amanda Kim is a 74 y.o. female who comes in today For evaluation and management of chronic recalcitrant low back pain particularly with referral into the right buttock and leg with radicular pattern potentially L5.  She relates all of her pain to horse riding accident where her horse fell and she straddled the horse and this occurred back in 1987.  Not really sure of known fracture or pathological finding at the time.  However chronically since that time she has had a lot of pain.  She rates her pain now is 7 out of 10 which is constant sharp burning and aching pain but can be worse with bending prolonged sitting and getting up and also standing.  She does get weekly massages that seem to help to a degree and she does take oxycodone which is helpful to a degree.  She has had physical therapy she continues with home exercise and activity modification.  She has been followed by Dr. Laroy Apple for many years and has had intermittent injections performed of her lumbar spine.  I did have about 50+ pages of notes from Dr. Ron Agee.  She has been very happy with his care she has had injections which appeared to be a combination of facet joint blocks and L5 transforaminal injections.  She has had an MRI of the lumbar spine which has been currently reviewed and updated.  This is reviewed with the patient today and reviewed below.  She does have small listhesis of L4 on L5 she does have bilateral pars defects but no high-grade central stenosis.  She does not get any left-sided leg pain but does have left-sided back pain.  She has not had radiofrequency ablation.  She is really frustrated at this point with the  amount of pain and the length of time that she has had it and is really been recalcitrant to all manner of conservative care.  She has seen a surgeon in the past and not really deemed to have a surgical spine.  She does not really want surgery.  Review of Systems  Musculoskeletal: Positive for back pain and joint pain.       Right radicular leg pain  All other systems reviewed and are negative.  Otherwise per HPI.  Assessment & Plan: Visit Diagnoses:  1. Spondylosis without myelopathy or radiculopathy, lumbar region   2. Spondylolisthesis of lumbar region   3. Chronic right-sided low back pain with right-sided sciatica   4. Chronic pain syndrome   5. Opioid use, unspecified, uncomplicated     Plan: Findings:  Chronic severe with intermittent exacerbations and recalcitrant to all manner of conservative care to this point including medication management with oxycodone and other medications as well as injections and physical therapy and activity modification.  Injections were helpful but temporary.  At this point I think her best approach and she does want to proceed with spinal cord stimulator trial and hopefully ultimately an implant.  This represents really a last chance procedure hopefully to get her some good functional relief so that she can carry on doing what she would like to do.  She is getting radicular type pain which is her biggest issue in  the right buttock and leg.  She will continue with current care and we will set up psychological evaluation likely with Dr. Carlis Abbott at [facility].    Meds & Orders: No orders of the defined types were placed in this encounter.   Orders Placed This Encounter  Procedures  . Ambulatory referral to Neuropsychology    Follow-up: Return for We will get preauthorization for spinal cord stimulator trial after psychological evaluation.   Procedures: No procedures performed  No notes on file   Clinical History: MRI LUMBAR SPINE WITHOUT  CONTRAST  TECHNIQUE: Multiplanar, multisequence MR imaging of the lumbar spine was performed. No intravenous contrast was administered.  COMPARISON: 2017  FINDINGS: Segmentation: For the purposes of this dictation, there are 5 lumbar type vertebral bodies with the caudal most designated L5.  Alignment: Stable alignment including mild anterolisthesis at L2-L3 and L4-L5.  Vertebrae: Stable vertebral body heights with mild degenerative endplate irregularity and small Schmorl's nodes. Minimal degenerative endplate marrow edema at L2. There is also degenerative marrow edema associated with the left L3-L4 facets. No suspicious osseous lesion.  Conus medullaris and cauda equina: Conus extends to the T12-L1 level. Conus and cauda equina appear normal.  Paraspinal and other soft tissues: Incompletely imaged similar to decreased size of nonspecific fluid collection in the right posterior subcutaneous fat near the lumbosacral junction.  Disc levels:  L1-L2: Minimal disc bulge. No significant canal or foraminal stenosis.  L2-L3: Anterolisthesis with uncovering of disc bulge. Marked facet arthropathy with ligamentum flavum infolding. Mild canal stenosis. Mild foraminal stenosis. Appearance is similar.  L3-L4: Disc bulge and marked facet arthropathy with ligamentum flavum infolding. Increased mild canal stenosis with partial effacement of the lateral recesses. Mild foraminal stenosis is similar.  L4-L5: Anterolisthesis with uncovering of disc bulge. Marked facet arthropathy with ligamentum flavum infolding. Mild canal and left foraminal stenosis. Mild-to-moderate right foraminal stenosis. Appearance is similar.  L5-S1: Disc bulge and marked facet arthropathy with ligamentum flavum infolding. No significant canal stenosis. Slightly increased moderate right and similar mild left foraminal stenosis.  IMPRESSION: Multilevel degenerative changes as detailed above.  Similar significant facet arthropathy. No high-grade canal stenosis. Foraminal stenosis is slightly increased on the right at L5-S1.   Electronically Signed  By: Macy Mis M.D.  On: 12/27/2018 15:28   She reports that she has quit smoking. Her smoking use included cigarettes. She has a 36.00 pack-year smoking history. She has never used smokeless tobacco. No results for input(s): HGBA1C, LABURIC in the last 8760 hours.  Objective:  VS:  HT:5\' 4"  (162.6 cm)   WT:135 lb (61.2 kg)  BMI:23.16    BP:(!) 172/94  HR:60bpm  TEMP: ( )  RESP:  Physical Exam Vitals and nursing note reviewed.  Constitutional:      General: She is not in acute distress.    Appearance: Normal appearance. She is well-developed.  HENT:     Head: Normocephalic and atraumatic.     Nose: Nose normal.     Mouth/Throat:     Mouth: Mucous membranes are moist.     Pharynx: Oropharynx is clear.  Eyes:     Conjunctiva/sclera: Conjunctivae normal.     Pupils: Pupils are equal, round, and reactive to light.  Cardiovascular:     Rate and Rhythm: Regular rhythm.  Pulmonary:     Effort: Pulmonary effort is normal. No respiratory distress.  Abdominal:     General: There is no distension.     Palpations: Abdomen is soft.     Tenderness: There  is no guarding.  Musculoskeletal:     Cervical back: Normal range of motion and neck supple.     Right lower leg: No edema.     Left lower leg: No edema.     Comments: Patient is slow to rise from a seated position to full extension and does have pain with facet loading.  She has no tender points but does have trigger points located in the quadratus lumborum.  No pain over the PSIS on the left but positive on the right.  Some greater trochanter pain on the right but not exquisite.  She has equivocally positive slump test on the right.  She has good distal strength and no clonus.  No pain with hip rotation.  Skin:    General: Skin is warm and dry.     Findings: No erythema or  rash.  Neurological:     General: No focal deficit present.     Mental Status: She is alert and oriented to person, place, and time.     Motor: No abnormal muscle tone.     Coordination: Coordination normal.     Gait: Gait normal.  Psychiatric:        Mood and Affect: Mood normal.        Behavior: Behavior normal.        Thought Content: Thought content normal.     Ortho Exam  Imaging: No results found.  Past Medical/Family/Surgical/Social History: Medications & Allergies reviewed per EMR, new medications updated. Patient Active Problem List   Diagnosis Date Noted  . Gasping for breath 10/07/2017  . Hx of chronic arthritis 10/07/2017  . Fatigue due to depression 10/07/2017  . Excessive daytime sleepiness 10/07/2017  . Cigarette smoker 10/07/2017  . Chronic back pain 03/21/2014  . Depression screening 03/21/2014  . UTI (urinary tract infection) 12/06/2013  . Cold sore 08/31/2013  . Insomnia 03/03/2013  . Depression 02/02/2013  . Acute anxiety 01/04/2013  . Odynophagia 12/14/2012  . Essential hypertension, benign 05/06/2012  . Gallstones 04/13/2012  . Back pain 01/13/2012  . Weight gain 09/05/2011  . Hyperlipidemia 09/05/2011  . Hypothyroid 05/07/2011  . Chronic pain 05/07/2011   Past Medical History:  Diagnosis Date  . Back pain    d/t horse fell on pt in the 80's  . Bone spur    in neck(since 1990) and 1st 3 fingers on right hand go numb  . Chronic pain    d/t horse accident in the 80's  . Headache(784.0)    tension  . History of colon polyps   . Hyperlipidemia    takes Fish Oil and CoQ10  . Hypertension    takes Inderal daily  . Hypothyroid 05/07/2011   takes Synthroid daily  . Leg cramps    takes Potassium prn  . Liver disease    nonalcholic fatty liver disease  . Nausea & vomiting    phenergan and zofran prn  . Spondylolisthesis   . Spondylosis of thoracolumbar region without myelopathy or radiculopathy    spine  . Urinary frequency    Family  History  Problem Relation Age of Onset  . Hypertension Mother   . Alcohol abuse Mother   . Depression Father   . Bipolar disorder Father   . Colon cancer Paternal Grandmother   . Cancer Paternal Grandmother        colon  . Heart attack Paternal Grandfather   . Heart disease Paternal Uncle   . Hyperlipidemia Paternal Uncle    Past  Surgical History:  Procedure Laterality Date  . [place]  . CHOLECYSTECTOMY N/A 05/20/2012   Procedure: LAPAROSCOPIC CHOLECYSTECTOMY WITH INTRAOPERATIVE CHOLANGIOGRAM;  Surgeon: Merrie Roof, MD;  Location: [facility];  Service: General;  Laterality: N/A;  . COLONOSCOPY    . TUBAL LIGATION  1981   Social History   Occupational History  . Occupation: retired-HR  . Occupation: small business owner-consulting  Tobacco Use  . Smoking status: Former Smoker    Packs/day: 0.80    Years: 45.00    Pack years: 36.00    Types: Cigarettes  . Smokeless tobacco: Never Used  . Tobacco comment: Quit smoking on 02/2014  Substance and Sexual Activity  . Alcohol use: Yes    Alcohol/week: 0.0 standard drinks    Comment: red wine couple of times a week  . Drug use: No  . Sexual activity: Yes    Birth control/protection: Post-menopausal

## 2019-06-28 NOTE — Progress Notes (Signed)
Pt states pain in the lower back mostly on the right side and radiates into the right leg all the way down. Pt states pain started back in 1987 after a horse accident. Pt states injections by Dr. Warnell Bureau that has helped out a lot. Bending over and standing too long makes pain worse. Massages helps a lot.   .Numeric Pain Rating Scale and Functional Assessment Average Pain 7 Pain Right Now 7 My pain is constant, sharp, burning and aching Pain is worse with: bending, sitting and standing Pain improves with: massages   In the last MONTH (on 0-10 scale) has pain interfered with the following?  1. General activity like being  able to carry out your everyday physical activities such as walking, climbing stairs, carrying groceries, or moving a chair?  Rating(8)  2. Relation with others like being able to carry out your usual social activities and roles such as  activities at home, at work and in your community. Rating(8)  3. Enjoyment of life such that you have  been bothered by emotional problems such as feeling anxious, depressed or irritable?  Rating(6)

## 2019-06-29 ENCOUNTER — Encounter: Payer: Self-pay | Admitting: Physical Medicine and Rehabilitation

## 2019-07-06 DIAGNOSIS — Z124 Encounter for screening for malignant neoplasm of cervix: Secondary | ICD-10-CM | POA: Diagnosis not present

## 2019-07-06 DIAGNOSIS — Z1231 Encounter for screening mammogram for malignant neoplasm of breast: Secondary | ICD-10-CM | POA: Diagnosis not present

## 2019-07-06 DIAGNOSIS — N952 Postmenopausal atrophic vaginitis: Secondary | ICD-10-CM | POA: Diagnosis not present

## 2019-07-06 DIAGNOSIS — Z01419 Encounter for gynecological examination (general) (routine) without abnormal findings: Secondary | ICD-10-CM | POA: Diagnosis not present

## 2019-07-06 DIAGNOSIS — N941 Unspecified dyspareunia: Secondary | ICD-10-CM | POA: Diagnosis not present

## 2019-07-08 DIAGNOSIS — F4323 Adjustment disorder with mixed anxiety and depressed mood: Secondary | ICD-10-CM | POA: Diagnosis not present

## 2019-07-12 ENCOUNTER — Other Ambulatory Visit: Payer: Self-pay | Admitting: Obstetrics & Gynecology

## 2019-07-12 DIAGNOSIS — R928 Other abnormal and inconclusive findings on diagnostic imaging of breast: Secondary | ICD-10-CM

## 2019-07-19 ENCOUNTER — Ambulatory Visit
Admission: RE | Admit: 2019-07-19 | Discharge: 2019-07-19 | Disposition: A | Discharge status: Home / Self Care | Payer: PPO | Source: Ambulatory Visit | Attending: Obstetrics & Gynecology | Admitting: Obstetrics & Gynecology

## 2019-07-19 ENCOUNTER — Other Ambulatory Visit: Payer: Self-pay

## 2019-07-19 DIAGNOSIS — N6001 Solitary cyst of right breast: Secondary | ICD-10-CM | POA: Diagnosis not present

## 2019-07-19 DIAGNOSIS — R928 Other abnormal and inconclusive findings on diagnostic imaging of breast: Secondary | ICD-10-CM

## 2019-07-20 DIAGNOSIS — M47816 Spondylosis without myelopathy or radiculopathy, lumbar region: Secondary | ICD-10-CM | POA: Diagnosis not present

## 2019-07-20 DIAGNOSIS — F3341 Major depressive disorder, recurrent, in partial remission: Secondary | ICD-10-CM | POA: Diagnosis not present

## 2019-07-21 DIAGNOSIS — M545 Low back pain: Secondary | ICD-10-CM | POA: Diagnosis not present

## 2019-07-21 DIAGNOSIS — Z79899 Other long term (current) drug therapy: Secondary | ICD-10-CM | POA: Diagnosis not present

## 2019-07-21 DIAGNOSIS — G8929 Other chronic pain: Secondary | ICD-10-CM | POA: Diagnosis not present

## 2019-07-21 DIAGNOSIS — E559 Vitamin D deficiency, unspecified: Secondary | ICD-10-CM | POA: Diagnosis not present

## 2019-08-02 ENCOUNTER — Telehealth: Payer: Self-pay | Admitting: Physical Medicine and Rehabilitation

## 2019-08-02 ENCOUNTER — Telehealth: Payer: Self-pay

## 2019-08-02 NOTE — Telephone Encounter (Signed)
Contacted [employer] Preauth Portal with updated ID number.

## 2019-08-02 NOTE — Telephone Encounter (Signed)
Called patient to advise that I submitted for prior auth and they are requesting an updated Medicare ID number. I submitted her 2021 [employer] information and the Medicare info last scanned in 2014. Patient states that she has never received a Medicare card. She will call them and call me back with information.

## 2019-08-02 NOTE — Telephone Encounter (Signed)
Pt states she was waiting on a spine stimulator trail and pt would like a CB to get some information on that whole process.   [phone]

## 2019-08-02 NOTE — Telephone Encounter (Signed)
Patient left a VM on triage phone concerning her Medicare number which is [id number].

## 2019-08-05 ENCOUNTER — Telehealth: Payer: Self-pay | Admitting: Physical Medicine and Rehabilitation

## 2019-08-05 NOTE — Telephone Encounter (Signed)
Patient is scheduled for SCS trial on 8/4 and would like valium prior. Pharmacy is correct.

## 2019-08-08 ENCOUNTER — Other Ambulatory Visit: Payer: Self-pay | Admitting: Physical Medicine and Rehabilitation

## 2019-08-08 DIAGNOSIS — F411 Generalized anxiety disorder: Secondary | ICD-10-CM

## 2019-08-08 MED ORDER — DIAZEPAM 5 MG PO TABS: ORAL_TABLET | ORAL | 0 refills | Status: DC

## 2019-08-08 NOTE — Progress Notes (Signed)
Pre-procedure diazepam ordered for pre-operative anxiety.  

## 2019-08-08 NOTE — Telephone Encounter (Signed)
Done

## 2019-08-10 DIAGNOSIS — H2513 Age-related nuclear cataract, bilateral: Secondary | ICD-10-CM | POA: Diagnosis not present

## 2019-08-10 DIAGNOSIS — H2512 Age-related nuclear cataract, left eye: Secondary | ICD-10-CM | POA: Diagnosis not present

## 2019-08-10 DIAGNOSIS — H40053 Ocular hypertension, bilateral: Secondary | ICD-10-CM | POA: Diagnosis not present

## 2019-08-10 DIAGNOSIS — H52213 Irregular astigmatism, bilateral: Secondary | ICD-10-CM | POA: Diagnosis not present

## 2019-08-10 DIAGNOSIS — H40013 Open angle with borderline findings, low risk, bilateral: Secondary | ICD-10-CM | POA: Diagnosis not present

## 2019-08-10 DIAGNOSIS — H25013 Cortical age-related cataract, bilateral: Secondary | ICD-10-CM | POA: Diagnosis not present

## 2019-08-24 DIAGNOSIS — F4323 Adjustment disorder with mixed anxiety and depressed mood: Secondary | ICD-10-CM | POA: Diagnosis not present

## 2019-08-30 DIAGNOSIS — D485 Neoplasm of uncertain behavior of skin: Secondary | ICD-10-CM | POA: Diagnosis not present

## 2019-08-30 DIAGNOSIS — Z85828 Personal history of other malignant neoplasm of skin: Secondary | ICD-10-CM | POA: Diagnosis not present

## 2019-08-30 DIAGNOSIS — L578 Other skin changes due to chronic exposure to nonionizing radiation: Secondary | ICD-10-CM | POA: Diagnosis not present

## 2019-08-30 DIAGNOSIS — C44519 Basal cell carcinoma of skin of other part of trunk: Secondary | ICD-10-CM | POA: Diagnosis not present

## 2019-08-30 DIAGNOSIS — L814 Other melanin hyperpigmentation: Secondary | ICD-10-CM | POA: Diagnosis not present

## 2019-08-30 DIAGNOSIS — D225 Melanocytic nevi of trunk: Secondary | ICD-10-CM | POA: Diagnosis not present

## 2019-08-30 DIAGNOSIS — L821 Other seborrheic keratosis: Secondary | ICD-10-CM | POA: Diagnosis not present

## 2019-09-05 ENCOUNTER — Encounter: Payer: Self-pay | Admitting: Physical Medicine and Rehabilitation

## 2019-09-06 DIAGNOSIS — H2512 Age-related nuclear cataract, left eye: Secondary | ICD-10-CM | POA: Diagnosis not present

## 2019-09-06 DIAGNOSIS — H25012 Cortical age-related cataract, left eye: Secondary | ICD-10-CM | POA: Diagnosis not present

## 2019-09-13 DIAGNOSIS — E559 Vitamin D deficiency, unspecified: Secondary | ICD-10-CM | POA: Diagnosis not present

## 2019-09-13 DIAGNOSIS — G894 Chronic pain syndrome: Secondary | ICD-10-CM | POA: Diagnosis not present

## 2019-09-13 DIAGNOSIS — G8929 Other chronic pain: Secondary | ICD-10-CM | POA: Diagnosis not present

## 2019-09-13 DIAGNOSIS — M545 Low back pain: Secondary | ICD-10-CM | POA: Diagnosis not present

## 2019-09-18 HISTORY — PX: SPINAL CORD STIMULATOR IMPLANT: SHX2422

## 2019-09-21 ENCOUNTER — Other Ambulatory Visit: Payer: Self-pay

## 2019-09-21 ENCOUNTER — Ambulatory Visit: Payer: Self-pay

## 2019-09-21 ENCOUNTER — Ambulatory Visit: Payer: PPO | Admitting: Physical Medicine and Rehabilitation

## 2019-09-21 ENCOUNTER — Encounter: Payer: Self-pay | Admitting: Physical Medicine and Rehabilitation

## 2019-09-21 VITALS — BP 151/86 | HR 57

## 2019-09-21 DIAGNOSIS — G894 Chronic pain syndrome: Secondary | ICD-10-CM

## 2019-09-21 DIAGNOSIS — M5416 Radiculopathy, lumbar region: Secondary | ICD-10-CM | POA: Diagnosis not present

## 2019-09-21 DIAGNOSIS — M4316 Spondylolisthesis, lumbar region: Secondary | ICD-10-CM

## 2019-09-21 MED ORDER — CEPHALEXIN 500 MG PO CAPS: 500.0000 mg | ORAL_CAPSULE | Freq: Two times a day (BID) | ORAL | 0 refills | Status: DC

## 2019-09-21 NOTE — Procedures (Signed)
Advanced programming analysis performed with the [employer] technical representative with physician input and  monitoring. [employer] FAST and contour programming with  combined therapy initiated. 

## 2019-09-21 NOTE — Progress Notes (Signed)
Pain across lower back. Sometimes has pain in right leg which can go into toes and anterior thigh. Numeric Pain Rating Scale and Functional Assessment Average Pain 7   In the last MONTH (on 0-10 scale) has pain interfered with the following?  1. General activity like being  able to carry out your everyday physical activities such as walking, climbing stairs, carrying groceries, or moving a chair?  Rating(9)   +Driver, -BT

## 2019-09-21 NOTE — Procedures (Signed)
Spinal Cord Stimulator Trial  Patient: Amanda Kim      Date of Birth: 1945/09/20 MRN: [id number] PCP: Merrilee Seashore, MD      Visit Date: 09/21/2019   Universal Protocol:    Date/Time: 09/20/2108:29 AM  Consent Given By: the patient  Position: PRONE  Additional Comments: Vital signs were monitored before and after the procedure. Patient was prepped and draped in the usual sterile fashion. The correct patient, procedure, and site was verified.   Injection Procedure Details:  Procedure Site One Meds Administered:  Meds ordered this encounter  Medications  . cephALEXin (KEFLEX) 500 MG capsule    Sig: Take 1 capsule (500 mg total) by mouth 2 (two) times daily.    Dispense:  14 capsule    Refill:  0     Location/Site: Leads were placed just to the left and right of midline with the upper most lead at the top of the T7 vertebral body just at the superior endplate and then spanning 3 vertebral bodies with the inferior lead at the bottom of the T9 vertebral body.   Needle size: 14 gauge   Needle type: EPIMED RX Coud Epidural Needle  Needle Placement: T12-L1 Dorsal epidural space  Findings:  -  -Comments: Excellent paresthesia coverage was obtained in all of the areas of the patient's normal pain  Procedure Details: After localization of the T12-L1 epidural space and the overlying soft tissue, the needle was introduced two bodies below this level to produce an adequate angle for epidural penetration. The soft tissue was infiltrated with 2-3 mls. of 1% Lidocaine without Epinephrine. Using the posterolateral approach, the #14 gauge Epimed needle was inserted down to the posterior aspect of the L1 lamina and then "walked off" the superior aspect of this lamina into the T11-12 epidural space. The epidural space was localized with loss of resistance and negative aspirate for CSF or blood. The [employer] (16) electrode stimulation lead was passed up so  that just the superior most lead was at the location noted above while maintaining the lead in the midline.   The above procedure was repeated for a second [place] (16) electrode stimulation lead for the opposite side. The pulse generator system was adjusted and programmed by myself and the company technical representative by varying the stimulator sites, rates, pulse amplitude, and duration. Adequate coverage was obtained as described above.  Subsequent to this, the introducer needle was removed and the spinal cord stimulator trial lead was fastened to the skin using steri-strips and strain reduction loop. The lead wire was then connected and Tegaderm was applied to produce an occlusive dressing. The patient was then brought out of the procedure room and was given a portable stimulator.  The patient will follow-up in three to seven days to determine whether this was efficacious.  Oral prophylactic antibiotic, either Keflex or clindamycin, was prescribed as noted.   Additional Comments:  The patient tolerated the procedure well Dressing: As noted above    Post-procedure details: Patient was observed during the procedure. Post-procedure instructions were reviewed.  Patient left the clinic in stable condition.

## 2019-09-21 NOTE — Progress Notes (Signed)
Amanda Kim - 74 y.o. female MRN [id number]  Date of birth: 25-Jun-1945  Office Visit Note: Visit Date: 09/21/2019 PCP: Merrilee Seashore, MD Referred by: Merrilee Seashore, MD  Subjective: Chief Complaint  Patient presents with  . Lower Back - Pain   HPI: Amanda Kim is a 74 y.o. female who comes in today at the request of Dr. Laurence Spates for planned spinal cord stimulator trial. The patient has failed conservative care including home exercise, medications, time and activity modification as well as injections and surgery.  This represents a procedure of last resort for recalcitrant pain. Pre-procedure psychological evaluation has been completed and procedure pre-authorization has been obtained.  Please see requesting physician notes for further details and justification.  She complains of chronic recalcitrant low back pain and right radicular buttock and thigh pain failing all manner of conservative care without being a good surgical candidate.  Referring: Self  ROS Otherwise per HPI.  Assessment & Plan: Visit Diagnoses:  1. Lumbar radiculopathy   2. Spondylolisthesis of lumbar region   3. Chronic pain syndrome     Plan: No additional findings.   Meds & Orders:  Meds ordered this encounter  Medications  . cephALEXin (KEFLEX) 500 MG capsule    Sig: Take 1 capsule (500 mg total) by mouth 2 (two) times daily.    Dispense:  14 capsule    Refill:  0    Orders Placed This Encounter  Procedures  . Spinal Cord Stimulator - Analysis  . Spinal Cord Stimulator - Placement  . XR C-ARM NO REPORT    Follow-up: Return in about 1 week (around 09/28/2019) for SCS lead pull.   Procedures: No procedures performed  Spinal Cord Stimulator Trial  Patient: Amanda Kim      Date of Birth: March 13, 1945 MRN: [id number] PCP: Merrilee Seashore, MD      Visit Date: 09/21/2019   Universal Protocol:    Date/Time: 09/20/2108:29 AM  Consent Given By: the  patient  Position: PRONE  Additional Comments: Vital signs were monitored before and after the procedure. Patient was prepped and draped in the usual sterile fashion. The correct patient, procedure, and site was verified.   Injection Procedure Details:  Procedure Site One Meds Administered:  Meds ordered this encounter  Medications  . cephALEXin (KEFLEX) 500 MG capsule    Sig: Take 1 capsule (500 mg total) by mouth 2 (two) times daily.    Dispense:  14 capsule    Refill:  0     Location/Site: Leads were placed just to the left and right of midline with the upper most lead at the top of the T7 vertebral body just at the superior endplate and then spanning 3 vertebral bodies with the inferior lead at the bottom of the T9 vertebral body.   Needle size: 14 gauge   Needle type: EPIMED RX Coud Epidural Needle  Needle Placement: T12-L1 Dorsal epidural space  Findings:  -  -Comments: Excellent paresthesia coverage was obtained in all of the areas of the patient's normal pain  Procedure Details: After localization of the T12-L1 epidural space and the overlying soft tissue, the needle was introduced two bodies below this level to produce an adequate angle for epidural penetration. The soft tissue was infiltrated with 2-3 mls. of 1% Lidocaine without Epinephrine. Using the posterolateral approach, the #14 gauge Epimed needle was inserted down to the posterior aspect of the L1 lamina and then "walked off" the superior aspect of this lamina into  the T11-12 epidural space. The epidural space was localized with loss of resistance and negative aspirate for CSF or blood. The [employer] (16) electrode stimulation lead was passed up so that just the superior most lead was at the location noted above while maintaining the lead in the midline.   The above procedure was repeated for a second [place] (16) electrode stimulation lead for the opposite side.  The pulse generator system was adjusted and programmed by myself and the company technical representative by varying the stimulator sites, rates, pulse amplitude, and duration. Adequate coverage was obtained as described above.  Subsequent to this, the introducer needle was removed and the spinal cord stimulator trial lead was fastened to the skin using steri-strips and strain reduction loop. The lead wire was then connected and Tegaderm was applied to produce an occlusive dressing. The patient was then brought out of the procedure room and was given a portable stimulator.  The patient will follow-up in three to seven days to determine whether this was efficacious.  Oral prophylactic antibiotic, either Keflex or clindamycin, was prescribed as noted.   Additional Comments:  The patient tolerated the procedure well Dressing: As noted above    Post-procedure details: Patient was observed during the procedure. Post-procedure instructions were reviewed.  Patient left the clinic in stable condition.    Advanced programming analysis performed with the [facility] with physician input and  monitoring.  [employer] FAST and Consulting civil engineer with  combined therapy initiated.     Clinical History: MRI LUMBAR SPINE WITHOUT CONTRAST  TECHNIQUE: Multiplanar, multisequence MR imaging of the lumbar spine was performed. No intravenous contrast was administered.  COMPARISON: 2017  FINDINGS: Segmentation: For the purposes of this dictation, there are 5 lumbar type vertebral bodies with the caudal most designated L5.  Alignment: Stable alignment including mild anterolisthesis at L2-L3 and L4-L5.  Vertebrae: Stable vertebral body heights with mild degenerative endplate irregularity and small Schmorl's nodes. Minimal degenerative endplate marrow edema at L2. There is also degenerative marrow edema associated with the left L3-L4 facets. No  suspicious osseous lesion.  Conus medullaris and cauda equina: Conus extends to the T12-L1 level. Conus and cauda equina appear normal.  Paraspinal and other soft tissues: Incompletely imaged similar to decreased size of nonspecific fluid collection in the right posterior subcutaneous fat near the lumbosacral junction.  Disc levels:  L1-L2: Minimal disc bulge. No significant canal or foraminal stenosis.  L2-L3: Anterolisthesis with uncovering of disc bulge. Marked facet arthropathy with ligamentum flavum infolding. Mild canal stenosis. Mild foraminal stenosis. Appearance is similar.  L3-L4: Disc bulge and marked facet arthropathy with ligamentum flavum infolding. Increased mild canal stenosis with partial effacement of the lateral recesses. Mild foraminal stenosis is similar.  L4-L5: Anterolisthesis with uncovering of disc bulge. Marked facet arthropathy with ligamentum flavum infolding. Mild canal and left foraminal stenosis. Mild-to-moderate right foraminal stenosis. Appearance is similar.  L5-S1: Disc bulge and marked facet arthropathy with ligamentum flavum infolding. No significant canal stenosis. Slightly increased moderate right and similar mild left foraminal stenosis.  IMPRESSION: Multilevel degenerative changes as detailed above. Similar significant facet arthropathy. No high-grade canal stenosis. Foraminal stenosis is slightly increased on the right at L5-S1.   Electronically Signed  By: Macy Mis M.D.  On: 12/27/2018 15:28   She reports that she has quit smoking. Her smoking use included cigarettes. She has a 36.00 pack-year smoking history. She has never used smokeless tobacco. No results for input(s): HGBA1C,  LABURIC in the last 8760 hours.  Objective:  VS:  HT:    WT:   BMI:     BP:(!) 151/86  HR:(!) 57bpm  TEMP: ( )  RESP:  Physical Exam Constitutional:      General: She is not in acute distress.    Appearance: Normal appearance. She is not  ill-appearing.  HENT:     Head: Normocephalic and atraumatic.     Right Ear: External ear normal.     Left Ear: External ear normal.  Eyes:     Extraocular Movements: Extraocular movements intact.  Cardiovascular:     Rate and Rhythm: Normal rate.     Pulses: Normal pulses.  Musculoskeletal:     Right lower leg: No edema.     Left lower leg: No edema.     Comments: Patient has good distal strength with no pain over the greater trochanters.  No clonus or focal weakness.  Skin:    Findings: No erythema, lesion or rash.  Neurological:     General: No focal deficit present.     Mental Status: She is alert and oriented to person, place, and time.     Sensory: No sensory deficit.     Motor: No weakness or abnormal muscle tone.     Coordination: Coordination normal.  Psychiatric:        Mood and Affect: Mood normal.        Behavior: Behavior normal.     Ortho Exam  Imaging: No results found.  Past Medical/Family/Surgical/Social History: Medications & Allergies reviewed per EMR, new medications updated. Patient Active Problem List   Diagnosis Date Noted  . Gasping for breath 10/07/2017  . Hx of chronic arthritis 10/07/2017  . Fatigue due to depression 10/07/2017  . Excessive daytime sleepiness 10/07/2017  . Cigarette smoker 10/07/2017  . Chronic back pain 03/21/2014  . Depression screening 03/21/2014  . UTI (urinary tract infection) 12/06/2013  . Cold sore 08/31/2013  . Insomnia 03/03/2013  . Depression 02/02/2013  . Acute anxiety 01/04/2013  . Odynophagia 12/14/2012  . Essential hypertension, benign 05/06/2012  . Gallstones 04/13/2012  . Back pain 01/13/2012  . Weight gain 09/05/2011  . Hyperlipidemia 09/05/2011  . Hypothyroid 05/07/2011  . Chronic pain 05/07/2011   Past Medical History:  Diagnosis Date  . Back pain    d/t horse fell on pt in the 80's  . Bone spur    in neck(since 1990) and 1st 3 fingers on right hand go numb  . Chronic pain    d/t horse  accident in the 80's  . Headache(784.0)    tension  . History of colon polyps   . Hyperlipidemia    takes Fish Oil and CoQ10  . Hypertension    takes Inderal daily  . Hypothyroid 05/07/2011   takes Synthroid daily  . Leg cramps    takes Potassium prn  . Liver disease    nonalcholic fatty liver disease  . Nausea & vomiting    phenergan and zofran prn  . Spondylolisthesis   . Spondylosis of thoracolumbar region without myelopathy or radiculopathy    spine  . Urinary frequency    Family History  Problem Relation Age of Onset  . Hypertension Mother   . Alcohol abuse Mother   . Depression Father   . Bipolar disorder Father   . Colon cancer Paternal Grandmother   . Cancer Paternal Grandmother        colon  . Heart attack Paternal Grandfather   .  Heart disease Paternal Uncle   . Hyperlipidemia Paternal Uncle    Past Surgical History:  Procedure Laterality Date  . [place]  . CHOLECYSTECTOMY N/A 05/20/2012   Procedure: LAPAROSCOPIC CHOLECYSTECTOMY WITH INTRAOPERATIVE CHOLANGIOGRAM;  Surgeon: Merrie Roof, MD;  Location: [facility];  Service: General;  Laterality: N/A;  . COLONOSCOPY    . TUBAL LIGATION  1981   Social History   Occupational History  . Occupation: retired-HR  . Occupation: small business owner-consulting  Tobacco Use  . Smoking status: Former Smoker    Packs/day: 0.80    Years: 45.00    Pack years: 36.00    Types: Cigarettes  . Smokeless tobacco: Never Used  . Tobacco comment: Quit smoking on 02/2014  Substance and Sexual Activity  . Alcohol use: Yes    Alcohol/week: 0.0 standard drinks    Comment: red wine couple of times a week  . Drug use: No  . Sexual activity: Yes    Birth control/protection: Post-menopausal

## 2019-09-27 ENCOUNTER — Ambulatory Visit (INDEPENDENT_AMBULATORY_CARE_PROVIDER_SITE_OTHER): Payer: PPO | Admitting: Physical Medicine and Rehabilitation

## 2019-09-27 ENCOUNTER — Other Ambulatory Visit: Payer: Self-pay

## 2019-09-27 ENCOUNTER — Ambulatory Visit: Payer: Self-pay

## 2019-09-27 ENCOUNTER — Encounter: Payer: Self-pay | Admitting: Physical Medicine and Rehabilitation

## 2019-09-27 DIAGNOSIS — G894 Chronic pain syndrome: Secondary | ICD-10-CM

## 2019-09-27 DIAGNOSIS — M48061 Spinal stenosis, lumbar region without neurogenic claudication: Secondary | ICD-10-CM

## 2019-09-27 DIAGNOSIS — M5416 Radiculopathy, lumbar region: Secondary | ICD-10-CM

## 2019-09-27 DIAGNOSIS — M4316 Spondylolisthesis, lumbar region: Secondary | ICD-10-CM

## 2019-09-27 NOTE — Progress Notes (Signed)
Patient reports that her pain level is down to a 2 with SCS trial.

## 2019-09-28 ENCOUNTER — Encounter: Payer: Self-pay | Admitting: Physical Medicine and Rehabilitation

## 2019-09-28 DIAGNOSIS — G8929 Other chronic pain: Secondary | ICD-10-CM | POA: Diagnosis not present

## 2019-09-28 DIAGNOSIS — E559 Vitamin D deficiency, unspecified: Secondary | ICD-10-CM | POA: Diagnosis not present

## 2019-09-28 DIAGNOSIS — Z79899 Other long term (current) drug therapy: Secondary | ICD-10-CM | POA: Diagnosis not present

## 2019-09-28 DIAGNOSIS — G894 Chronic pain syndrome: Secondary | ICD-10-CM | POA: Diagnosis not present

## 2019-09-28 DIAGNOSIS — M545 Low back pain: Secondary | ICD-10-CM | POA: Diagnosis not present

## 2019-09-28 NOTE — Progress Notes (Signed)
Amanda Kim - 74 y.o. female MRN [id number]  Date of birth: September 18, 1945  Office Visit Note: Visit Date: 09/27/2019 PCP: Merrilee Seashore, MD Referred by: Merrilee Seashore, MD  Subjective: Chief Complaint  Patient presents with  . Lower Back - Pain   HPI: Amanda Kim is a 74 y.o. female who comes in today For spinal cord stimulator trial lead pull.  Patient did extremely well with her trial.  Her pain level went from 7 pretrial to a 2.  She reports almost 60% reduction in pain.  She is very happy with those results and does want to proceed with spinal cord stimulator implantation.  Discussing with her today and with Tillie Rung from [place] the patient was requiring significant amount of stimulation energy during the trial.  We do see this from time to time that some folks just like the extra stimulation.  It probably would be wise to have her implanted by Dr. Sherley Bounds at [facility] with paddle leads to be more efficient in energy delivery.  We also did take a quick fluoroscopic image today of the trial leads and she had had significant migration of the left lead and some migration at least 1 vertebral body on the right leg.  She does report being very active during the trial and that she is a very active person and did not want a move around.  Nonetheless even with lead migration she did get good relief even during the endpoint of the trial.  Initial placement of the leads was at the top of the T7 vertebral body and those leads were stimulated at the top because her more abdominal pain stimulation was occurring below that level.  She also has somewhat of a transitional segmental spine.  L5 was considered the lowest vertebral body based on MRI but she does have hypoplastic ribs at T12.  Quick review of history for Dr. Ronnald Ramp.  Patient was seen by Dr. Laroy Apple at [place] for quite some time before seeing me  recently.  Spinal procedures and evaluation were predominantly completed by him.  ROS Otherwise per HPI.  Assessment & Plan: Visit Diagnoses:  1. Lumbar radiculopathy   2. Chronic pain syndrome   3. Foraminal stenosis of lumbar region   4. Spondylolisthesis of lumbar region     Plan: Findings:  Referral to Dr. Sherley Bounds for implantation.  MRI of the thoracic spine will be ordered prior to implantation.    Meds & Orders: No orders of the defined types were placed in this encounter.   Orders Placed This Encounter  Procedures  . XR C-ARM NO REPORT  . MR THORACIC SPINE WO CONTRAST  . Ambulatory referral to Neurosurgery    Follow-up: No follow-ups on file.   Procedures: Quick procedure note: Patient was placed prone on the exam table. The external stimulator/generator was unfastened from the leads. The outer Tegaderm was removed from the bandage carefully. There was no noted erythema of the skin or swelling or induration or drainage. There had been some bloody discharge in the gauze pad. Both trial leads were pulled without difficulty and without discomfort. There was no drainage from the insertion site. Band-Aid was applied.  Clinical History: MRI LUMBAR SPINE WITHOUT CONTRAST  TECHNIQUE: Multiplanar, multisequence MR imaging of the lumbar spine was performed. No intravenous contrast was administered.  COMPARISON: 2017  FINDINGS: Segmentation: For the purposes of this dictation, there are 5 lumbar type vertebral bodies with the caudal  most designated L5.  Alignment: Stable alignment including mild anterolisthesis at L2-L3 and L4-L5.  Vertebrae: Stable vertebral body heights with mild degenerative endplate irregularity and small Schmorl's nodes. Minimal degenerative endplate marrow edema at L2. There is also degenerative marrow edema associated with the left L3-L4 facets. No suspicious osseous lesion.  Conus medullaris and cauda equina: Conus extends to the  T12-L1 level. Conus and cauda equina appear normal.  Paraspinal and other soft tissues: Incompletely imaged similar to decreased size of nonspecific fluid collection in the right posterior subcutaneous fat near the lumbosacral junction.  Disc levels:  L1-L2: Minimal disc bulge. No significant canal or foraminal stenosis.  L2-L3: Anterolisthesis with uncovering of disc bulge. Marked facet arthropathy with ligamentum flavum infolding. Mild canal stenosis. Mild foraminal stenosis. Appearance is similar.  L3-L4: Disc bulge and marked facet arthropathy with ligamentum flavum infolding. Increased mild canal stenosis with partial effacement of the lateral recesses. Mild foraminal stenosis is similar.  L4-L5: Anterolisthesis with uncovering of disc bulge. Marked facet arthropathy with ligamentum flavum infolding. Mild canal and left foraminal stenosis. Mild-to-moderate right foraminal stenosis. Appearance is similar.  L5-S1: Disc bulge and marked facet arthropathy with ligamentum flavum infolding. No significant canal stenosis. Slightly increased moderate right and similar mild left foraminal stenosis.  IMPRESSION: Multilevel degenerative changes as detailed above. Similar significant facet arthropathy. No high-grade canal stenosis. Foraminal stenosis is slightly increased on the right at L5-S1.   Electronically Signed  By: Macy Mis M.D.  On: 12/27/2018 15:28   She reports that she has quit smoking. Her smoking use included cigarettes. She has a 36.00 pack-year smoking history. She has never used smokeless tobacco. No results for input(s): HGBA1C, LABURIC in the last 8760 hours.  Objective:  VS:  HT:    WT:   BMI:     BP:   HR: bpm  TEMP: ( )  RESP:  Physical Exam Vitals and nursing note reviewed.  Constitutional:      General: She is not in acute distress.    Appearance: Normal appearance. She is well-developed. She is not ill-appearing.  HENT:     Head:  Normocephalic and atraumatic.  Eyes:     Conjunctiva/sclera: Conjunctivae normal.     Pupils: Pupils are equal, round, and reactive to light.  Cardiovascular:     Rate and Rhythm: Normal rate.     Pulses: Normal pulses.  Pulmonary:     Effort: Pulmonary effort is normal.  Musculoskeletal:     Right lower leg: No edema.     Left lower leg: No edema.  Skin:    General: Skin is warm and dry.     Findings: No erythema or rash.     Comments: Skin area around the puncture sites were not indurated red or swollen.  There was no drainage.  Skin around the bandages was intact without redness or irritation.  Neurological:     General: No focal deficit present.     Mental Status: She is alert and oriented to person, place, and time.     Sensory: No sensory deficit.     Motor: No abnormal muscle tone.     Coordination: Coordination normal.     Gait: Gait normal.  Psychiatric:        Mood and Affect: Mood normal.        Behavior: Behavior normal.     Ortho Exam  Imaging: XR C-ARM NO REPORT  Result Date: 09/27/2019 Please see Notes tab for imaging impression.   Past  Medical/Family/Surgical/Social History: Medications & Allergies reviewed per EMR, new medications updated. Patient Active Problem List   Diagnosis Date Noted  . Gasping for breath 10/07/2017  . Hx of chronic arthritis 10/07/2017  . Fatigue due to depression 10/07/2017  . Excessive daytime sleepiness 10/07/2017  . Cigarette smoker 10/07/2017  . Chronic back pain 03/21/2014  . Depression screening 03/21/2014  . UTI (urinary tract infection) 12/06/2013  . Cold sore 08/31/2013  . Insomnia 03/03/2013  . Depression 02/02/2013  . Acute anxiety 01/04/2013  . Odynophagia 12/14/2012  . Essential hypertension, benign 05/06/2012  . Gallstones 04/13/2012  . Back pain 01/13/2012  . Weight gain 09/05/2011  . Hyperlipidemia 09/05/2011  . Hypothyroid 05/07/2011  . Chronic pain 05/07/2011   Past Medical History:  Diagnosis  Date  . Back pain    d/t horse fell on pt in the 80's  . Bone spur    in neck(since 1990) and 1st 3 fingers on right hand go numb  . Chronic pain    d/t horse accident in the 80's  . Headache(784.0)    tension  . History of colon polyps   . Hyperlipidemia    takes Fish Oil and CoQ10  . Hypertension    takes Inderal daily  . Hypothyroid 05/07/2011   takes Synthroid daily  . Leg cramps    takes Potassium prn  . Liver disease    nonalcholic fatty liver disease  . Nausea & vomiting    phenergan and zofran prn  . Spondylolisthesis   . Spondylosis of thoracolumbar region without myelopathy or radiculopathy    spine  . Urinary frequency    Family History  Problem Relation Age of Onset  . Hypertension Mother   . Alcohol abuse Mother   . Depression Father   . Bipolar disorder Father   . Colon cancer Paternal Grandmother   . Cancer Paternal Grandmother        colon  . Heart attack Paternal Grandfather   . Heart disease Paternal Uncle   . Hyperlipidemia Paternal Uncle    Past Surgical History:  Procedure Laterality Date  . [place]  . CHOLECYSTECTOMY N/A 05/20/2012   Procedure: LAPAROSCOPIC CHOLECYSTECTOMY WITH INTRAOPERATIVE CHOLANGIOGRAM;  Surgeon: Merrie Roof, MD;  Location: [facility];  Service: General;  Laterality: N/A;  . COLONOSCOPY    . TUBAL LIGATION  1981   Social History   Occupational History  . Occupation: retired-HR  . Occupation: small business owner-consulting  Tobacco Use  . Smoking status: Former Smoker    Packs/day: 0.80    Years: 45.00    Pack years: 36.00    Types: Cigarettes  . Smokeless tobacco: Never Used  . Tobacco comment: Quit smoking on 02/2014  Substance and Sexual Activity  . Alcohol use: Yes    Alcohol/week: 0.0 standard drinks    Comment: red wine couple of times a week  . Drug use: No  . Sexual activity: Yes    Birth control/protection: Post-menopausal

## 2019-10-03 DIAGNOSIS — F4323 Adjustment disorder with mixed anxiety and depressed mood: Secondary | ICD-10-CM | POA: Diagnosis not present

## 2019-10-05 DIAGNOSIS — H25011 Cortical age-related cataract, right eye: Secondary | ICD-10-CM | POA: Diagnosis not present

## 2019-10-05 DIAGNOSIS — H2511 Age-related nuclear cataract, right eye: Secondary | ICD-10-CM | POA: Diagnosis not present

## 2019-10-11 ENCOUNTER — Ambulatory Visit
Admission: RE | Admit: 2019-10-11 | Discharge: 2019-10-11 | Disposition: A | Discharge status: Home / Self Care | Payer: PPO | Source: Ambulatory Visit | Attending: Physical Medicine and Rehabilitation | Admitting: Physical Medicine and Rehabilitation

## 2019-10-11 DIAGNOSIS — H2511 Age-related nuclear cataract, right eye: Secondary | ICD-10-CM | POA: Diagnosis not present

## 2019-10-11 DIAGNOSIS — H25811 Combined forms of age-related cataract, right eye: Secondary | ICD-10-CM | POA: Diagnosis not present

## 2019-10-11 DIAGNOSIS — H25011 Cortical age-related cataract, right eye: Secondary | ICD-10-CM | POA: Diagnosis not present

## 2019-10-11 DIAGNOSIS — M5124 Other intervertebral disc displacement, thoracic region: Secondary | ICD-10-CM | POA: Diagnosis not present

## 2019-10-11 DIAGNOSIS — M545 Low back pain: Secondary | ICD-10-CM | POA: Diagnosis not present

## 2019-10-11 DIAGNOSIS — M4316 Spondylolisthesis, lumbar region: Secondary | ICD-10-CM

## 2019-10-11 DIAGNOSIS — M47814 Spondylosis without myelopathy or radiculopathy, thoracic region: Secondary | ICD-10-CM | POA: Diagnosis not present

## 2019-10-11 DIAGNOSIS — G894 Chronic pain syndrome: Secondary | ICD-10-CM | POA: Diagnosis not present

## 2019-10-17 DIAGNOSIS — C44519 Basal cell carcinoma of skin of other part of trunk: Secondary | ICD-10-CM | POA: Diagnosis not present

## 2019-10-17 DIAGNOSIS — L820 Inflamed seborrheic keratosis: Secondary | ICD-10-CM | POA: Diagnosis not present

## 2019-10-18 ENCOUNTER — Ambulatory Visit: Payer: Self-pay | Attending: Internal Medicine

## 2019-10-18 DIAGNOSIS — Z23 Encounter for immunization: Secondary | ICD-10-CM

## 2019-10-18 DIAGNOSIS — M545 Low back pain: Secondary | ICD-10-CM | POA: Diagnosis not present

## 2019-10-18 NOTE — Progress Notes (Signed)
   Covid-19 Vaccination Clinic  Name:  Amanda Kim    MRN: [id number] DOB: 01-05-1946  10/18/2019  Amanda Kim was observed post Covid-19 immunization for 15 minutes without incident. She was provided with Vaccine Information Sheet and instruction to access the V-Safe system.   Amanda Kim was instructed to call 911 with any severe reactions post vaccine: Marland Kitchen Difficulty breathing  . Swelling of face and throat  . A fast heartbeat  . A bad rash all over body  . Dizziness and weakness

## 2019-11-02 DIAGNOSIS — G8929 Other chronic pain: Secondary | ICD-10-CM | POA: Diagnosis not present

## 2019-11-02 DIAGNOSIS — M4316 Spondylolisthesis, lumbar region: Secondary | ICD-10-CM | POA: Diagnosis not present

## 2019-11-02 DIAGNOSIS — G894 Chronic pain syndrome: Secondary | ICD-10-CM | POA: Diagnosis not present

## 2019-11-02 DIAGNOSIS — M5416 Radiculopathy, lumbar region: Secondary | ICD-10-CM | POA: Diagnosis not present

## 2019-11-02 DIAGNOSIS — M545 Low back pain: Secondary | ICD-10-CM | POA: Diagnosis not present

## 2019-11-04 DIAGNOSIS — F4323 Adjustment disorder with mixed anxiety and depressed mood: Secondary | ICD-10-CM | POA: Diagnosis not present

## 2019-11-14 DIAGNOSIS — Z23 Encounter for immunization: Secondary | ICD-10-CM | POA: Diagnosis not present

## 2019-11-14 DIAGNOSIS — I10 Essential (primary) hypertension: Secondary | ICD-10-CM | POA: Diagnosis not present

## 2019-12-01 DIAGNOSIS — E039 Hypothyroidism, unspecified: Secondary | ICD-10-CM | POA: Diagnosis not present

## 2019-12-01 DIAGNOSIS — I10 Essential (primary) hypertension: Secondary | ICD-10-CM | POA: Diagnosis not present

## 2019-12-01 DIAGNOSIS — I700 Atherosclerosis of aorta: Secondary | ICD-10-CM | POA: Diagnosis not present

## 2019-12-01 DIAGNOSIS — R7309 Other abnormal glucose: Secondary | ICD-10-CM | POA: Diagnosis not present

## 2019-12-14 DIAGNOSIS — F4323 Adjustment disorder with mixed anxiety and depressed mood: Secondary | ICD-10-CM | POA: Diagnosis not present

## 2019-12-15 DIAGNOSIS — F332 Major depressive disorder, recurrent severe without psychotic features: Secondary | ICD-10-CM | POA: Diagnosis not present

## 2019-12-15 DIAGNOSIS — J449 Chronic obstructive pulmonary disease, unspecified: Secondary | ICD-10-CM | POA: Diagnosis not present

## 2019-12-15 DIAGNOSIS — E039 Hypothyroidism, unspecified: Secondary | ICD-10-CM | POA: Diagnosis not present

## 2019-12-15 DIAGNOSIS — K760 Fatty (change of) liver, not elsewhere classified: Secondary | ICD-10-CM | POA: Diagnosis not present

## 2019-12-15 DIAGNOSIS — R7309 Other abnormal glucose: Secondary | ICD-10-CM | POA: Diagnosis not present

## 2019-12-27 DIAGNOSIS — R197 Diarrhea, unspecified: Secondary | ICD-10-CM | POA: Diagnosis not present

## 2020-01-03 ENCOUNTER — Ambulatory Visit (INDEPENDENT_AMBULATORY_CARE_PROVIDER_SITE_OTHER): Payer: PPO

## 2020-01-03 ENCOUNTER — Ambulatory Visit (HOSPITAL_COMMUNITY)
Admission: EM | Admit: 2020-01-03 | Discharge: 2020-01-03 | Disposition: A | Discharge status: Home / Self Care | Payer: PPO | Attending: Urgent Care | Admitting: Urgent Care

## 2020-01-03 ENCOUNTER — Other Ambulatory Visit: Payer: Self-pay

## 2020-01-03 ENCOUNTER — Encounter (HOSPITAL_COMMUNITY): Payer: Self-pay | Admitting: Emergency Medicine

## 2020-01-03 DIAGNOSIS — M549 Dorsalgia, unspecified: Secondary | ICD-10-CM

## 2020-01-03 DIAGNOSIS — T07XXXA Unspecified multiple injuries, initial encounter: Secondary | ICD-10-CM

## 2020-01-03 DIAGNOSIS — R0789 Other chest pain: Secondary | ICD-10-CM

## 2020-01-03 DIAGNOSIS — R079 Chest pain, unspecified: Secondary | ICD-10-CM

## 2020-01-03 DIAGNOSIS — R58 Hemorrhage, not elsewhere classified: Secondary | ICD-10-CM

## 2020-01-03 DIAGNOSIS — M542 Cervicalgia: Secondary | ICD-10-CM

## 2020-01-03 MED ORDER — TIZANIDINE HCL 4 MG PO TABS: 4.0000 mg | ORAL_TABLET | Freq: Every day | ORAL | 0 refills | Status: DC

## 2020-01-03 NOTE — Discharge Instructions (Signed)
Do not use any nonsteroidal anti-inflammatories (NSAIDs) like ibuprofen, Motrin, naproxen, Aleve, etc. which are all available over-the-counter.  Please just use Tylenol at a dose of 500mg -650mg  once every 6 hours as needed for your aches, pains, fevers.
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
## 2020-02-02 ENCOUNTER — Telehealth: Payer: Self-pay | Admitting: Physical Medicine and Rehabilitation

## 2020-02-02 NOTE — Telephone Encounter (Signed)
Please advise 

## 2020-02-02 NOTE — Telephone Encounter (Signed)
Patient called needing an appointment with Dr Ernestina Patches for Thumb and right hip. The number to contact patient is [phone]

## 2020-02-02 NOTE — Telephone Encounter (Signed)
1)? Has she talked to Dr. Ronnald Ramp if felt to be SCS stim related or Tillie Rung. 2) ok though for OV hip 3) thumb better served with Ortho of her choice or if here we can make suggestion. I think she has been to [facility] in past.

## 2020-02-03 NOTE — Telephone Encounter (Signed)
Called patient to advise. She states that she did speak with Erasmo Downer and was told that the SCS could not pinpoint a joint for pain relief. She states that she usually gets a hip injection once a year and it helps. I offered her an appointment for her hip, but she is going to see if she can get  an appointment sooner at another office for hip and thumb pain.

## 2020-02-06 DIAGNOSIS — M9902 Segmental and somatic dysfunction of thoracic region: Secondary | ICD-10-CM | POA: Diagnosis not present

## 2020-02-06 DIAGNOSIS — M9901 Segmental and somatic dysfunction of cervical region: Secondary | ICD-10-CM | POA: Diagnosis not present

## 2020-02-06 DIAGNOSIS — M531 Cervicobrachial syndrome: Secondary | ICD-10-CM | POA: Diagnosis not present

## 2020-02-06 DIAGNOSIS — M5032 Other cervical disc degeneration, mid-cervical region, unspecified level: Secondary | ICD-10-CM | POA: Diagnosis not present

## 2020-02-07 DIAGNOSIS — M7061 Trochanteric bursitis, right hip: Secondary | ICD-10-CM | POA: Diagnosis not present

## 2020-02-07 DIAGNOSIS — M7062 Trochanteric bursitis, left hip: Secondary | ICD-10-CM | POA: Diagnosis not present

## 2020-02-08 DIAGNOSIS — M5032 Other cervical disc degeneration, mid-cervical region, unspecified level: Secondary | ICD-10-CM | POA: Diagnosis not present

## 2020-02-08 DIAGNOSIS — M9902 Segmental and somatic dysfunction of thoracic region: Secondary | ICD-10-CM | POA: Diagnosis not present

## 2020-02-08 DIAGNOSIS — M531 Cervicobrachial syndrome: Secondary | ICD-10-CM | POA: Diagnosis not present

## 2020-02-08 DIAGNOSIS — M9901 Segmental and somatic dysfunction of cervical region: Secondary | ICD-10-CM | POA: Diagnosis not present

## 2020-02-09 DIAGNOSIS — M9902 Segmental and somatic dysfunction of thoracic region: Secondary | ICD-10-CM | POA: Diagnosis not present

## 2020-02-09 DIAGNOSIS — M531 Cervicobrachial syndrome: Secondary | ICD-10-CM | POA: Diagnosis not present

## 2020-02-09 DIAGNOSIS — M9901 Segmental and somatic dysfunction of cervical region: Secondary | ICD-10-CM | POA: Diagnosis not present

## 2020-02-09 DIAGNOSIS — M5032 Other cervical disc degeneration, mid-cervical region, unspecified level: Secondary | ICD-10-CM | POA: Diagnosis not present

## 2020-02-13 DIAGNOSIS — M9902 Segmental and somatic dysfunction of thoracic region: Secondary | ICD-10-CM | POA: Diagnosis not present

## 2020-02-13 DIAGNOSIS — M531 Cervicobrachial syndrome: Secondary | ICD-10-CM | POA: Diagnosis not present

## 2020-02-13 DIAGNOSIS — M9901 Segmental and somatic dysfunction of cervical region: Secondary | ICD-10-CM | POA: Diagnosis not present

## 2020-02-13 DIAGNOSIS — M5032 Other cervical disc degeneration, mid-cervical region, unspecified level: Secondary | ICD-10-CM | POA: Diagnosis not present

## 2020-02-22 DIAGNOSIS — M7061 Trochanteric bursitis, right hip: Secondary | ICD-10-CM | POA: Diagnosis not present

## 2020-02-22 DIAGNOSIS — M7062 Trochanteric bursitis, left hip: Secondary | ICD-10-CM | POA: Diagnosis not present

## 2020-02-23 DIAGNOSIS — F4323 Adjustment disorder with mixed anxiety and depressed mood: Secondary | ICD-10-CM | POA: Diagnosis not present

## 2020-02-27 DIAGNOSIS — F4323 Adjustment disorder with mixed anxiety and depressed mood: Secondary | ICD-10-CM | POA: Diagnosis not present

## 2020-03-07 DIAGNOSIS — F4323 Adjustment disorder with mixed anxiety and depressed mood: Secondary | ICD-10-CM | POA: Diagnosis not present

## 2020-03-08 DIAGNOSIS — R7309 Other abnormal glucose: Secondary | ICD-10-CM | POA: Diagnosis not present

## 2020-03-08 DIAGNOSIS — M65311 Trigger thumb, right thumb: Secondary | ICD-10-CM | POA: Diagnosis not present

## 2020-03-08 DIAGNOSIS — K760 Fatty (change of) liver, not elsewhere classified: Secondary | ICD-10-CM | POA: Diagnosis not present

## 2020-03-08 DIAGNOSIS — E039 Hypothyroidism, unspecified: Secondary | ICD-10-CM | POA: Diagnosis not present

## 2020-03-08 DIAGNOSIS — J449 Chronic obstructive pulmonary disease, unspecified: Secondary | ICD-10-CM | POA: Diagnosis not present

## 2020-03-08 DIAGNOSIS — M79641 Pain in right hand: Secondary | ICD-10-CM | POA: Diagnosis not present

## 2020-03-15 DIAGNOSIS — I10 Essential (primary) hypertension: Secondary | ICD-10-CM | POA: Diagnosis not present

## 2020-03-15 DIAGNOSIS — Z23 Encounter for immunization: Secondary | ICD-10-CM | POA: Diagnosis not present

## 2020-03-15 DIAGNOSIS — I700 Atherosclerosis of aorta: Secondary | ICD-10-CM | POA: Diagnosis not present

## 2020-03-15 DIAGNOSIS — E039 Hypothyroidism, unspecified: Secondary | ICD-10-CM | POA: Diagnosis not present

## 2020-03-15 DIAGNOSIS — J432 Centrilobular emphysema: Secondary | ICD-10-CM | POA: Diagnosis not present

## 2020-03-15 DIAGNOSIS — R7309 Other abnormal glucose: Secondary | ICD-10-CM | POA: Diagnosis not present

## 2020-03-19 DIAGNOSIS — F4323 Adjustment disorder with mixed anxiety and depressed mood: Secondary | ICD-10-CM | POA: Diagnosis not present

## 2020-03-19 DIAGNOSIS — Z20828 Contact with and (suspected) exposure to other viral communicable diseases: Secondary | ICD-10-CM | POA: Diagnosis not present

## 2020-04-18 DIAGNOSIS — F4323 Adjustment disorder with mixed anxiety and depressed mood: Secondary | ICD-10-CM | POA: Diagnosis not present

## 2020-05-10 DIAGNOSIS — J432 Centrilobular emphysema: Secondary | ICD-10-CM | POA: Diagnosis not present

## 2020-05-10 DIAGNOSIS — E039 Hypothyroidism, unspecified: Secondary | ICD-10-CM | POA: Diagnosis not present

## 2020-05-10 DIAGNOSIS — R5383 Other fatigue: Secondary | ICD-10-CM | POA: Diagnosis not present

## 2020-05-14 DIAGNOSIS — F4323 Adjustment disorder with mixed anxiety and depressed mood: Secondary | ICD-10-CM | POA: Diagnosis not present

## 2020-05-17 DIAGNOSIS — H35363 Drusen (degenerative) of macula, bilateral: Secondary | ICD-10-CM | POA: Diagnosis not present

## 2020-05-17 DIAGNOSIS — Z961 Presence of intraocular lens: Secondary | ICD-10-CM | POA: Diagnosis not present

## 2020-05-17 DIAGNOSIS — H40053 Ocular hypertension, bilateral: Secondary | ICD-10-CM | POA: Diagnosis not present

## 2020-05-17 DIAGNOSIS — H40013 Open angle with borderline findings, low risk, bilateral: Secondary | ICD-10-CM | POA: Diagnosis not present

## 2020-06-18 DIAGNOSIS — F4323 Adjustment disorder with mixed anxiety and depressed mood: Secondary | ICD-10-CM | POA: Diagnosis not present

## 2020-06-21 ENCOUNTER — Other Ambulatory Visit: Payer: Self-pay | Admitting: Internal Medicine

## 2020-06-21 DIAGNOSIS — Z1231 Encounter for screening mammogram for malignant neoplasm of breast: Secondary | ICD-10-CM

## 2020-06-26 DIAGNOSIS — M7062 Trochanteric bursitis, left hip: Secondary | ICD-10-CM | POA: Diagnosis not present

## 2020-06-28 DIAGNOSIS — I700 Atherosclerosis of aorta: Secondary | ICD-10-CM | POA: Diagnosis not present

## 2020-06-28 DIAGNOSIS — I10 Essential (primary) hypertension: Secondary | ICD-10-CM | POA: Diagnosis not present

## 2020-06-28 DIAGNOSIS — R7309 Other abnormal glucose: Secondary | ICD-10-CM | POA: Diagnosis not present

## 2020-06-28 DIAGNOSIS — E039 Hypothyroidism, unspecified: Secondary | ICD-10-CM | POA: Diagnosis not present

## 2020-06-28 DIAGNOSIS — R7301 Impaired fasting glucose: Secondary | ICD-10-CM | POA: Diagnosis not present

## 2020-06-28 DIAGNOSIS — J449 Chronic obstructive pulmonary disease, unspecified: Secondary | ICD-10-CM | POA: Diagnosis not present

## 2020-07-02 DIAGNOSIS — M7062 Trochanteric bursitis, left hip: Secondary | ICD-10-CM | POA: Diagnosis not present

## 2020-07-04 ENCOUNTER — Other Ambulatory Visit: Payer: Self-pay

## 2020-07-04 ENCOUNTER — Ambulatory Visit
Admission: RE | Admit: 2020-07-04 | Discharge: 2020-07-04 | Disposition: A | Discharge status: Home / Self Care | Payer: PPO | Source: Ambulatory Visit

## 2020-07-04 DIAGNOSIS — Z1231 Encounter for screening mammogram for malignant neoplasm of breast: Secondary | ICD-10-CM

## 2020-07-11 DIAGNOSIS — F4323 Adjustment disorder with mixed anxiety and depressed mood: Secondary | ICD-10-CM | POA: Diagnosis not present

## 2020-07-31 DIAGNOSIS — F4323 Adjustment disorder with mixed anxiety and depressed mood: Secondary | ICD-10-CM | POA: Diagnosis not present

## 2020-08-07 DIAGNOSIS — Z0000 Encounter for general adult medical examination without abnormal findings: Secondary | ICD-10-CM | POA: Diagnosis not present

## 2020-08-13 DIAGNOSIS — L821 Other seborrheic keratosis: Secondary | ICD-10-CM | POA: Diagnosis not present

## 2020-08-13 DIAGNOSIS — C44519 Basal cell carcinoma of skin of other part of trunk: Secondary | ICD-10-CM | POA: Diagnosis not present

## 2020-08-13 DIAGNOSIS — D1801 Hemangioma of skin and subcutaneous tissue: Secondary | ICD-10-CM | POA: Diagnosis not present

## 2020-08-15 DIAGNOSIS — I10 Essential (primary) hypertension: Secondary | ICD-10-CM | POA: Diagnosis not present

## 2020-08-15 DIAGNOSIS — Z0000 Encounter for general adult medical examination without abnormal findings: Secondary | ICD-10-CM | POA: Diagnosis not present

## 2020-08-15 DIAGNOSIS — F17210 Nicotine dependence, cigarettes, uncomplicated: Secondary | ICD-10-CM | POA: Diagnosis not present

## 2020-08-15 DIAGNOSIS — F411 Generalized anxiety disorder: Secondary | ICD-10-CM | POA: Diagnosis not present

## 2020-08-15 DIAGNOSIS — E039 Hypothyroidism, unspecified: Secondary | ICD-10-CM | POA: Diagnosis not present

## 2020-08-15 DIAGNOSIS — F332 Major depressive disorder, recurrent severe without psychotic features: Secondary | ICD-10-CM | POA: Diagnosis not present

## 2020-08-15 DIAGNOSIS — E782 Mixed hyperlipidemia: Secondary | ICD-10-CM | POA: Diagnosis not present

## 2020-08-15 DIAGNOSIS — E559 Vitamin D deficiency, unspecified: Secondary | ICD-10-CM | POA: Diagnosis not present

## 2020-08-15 DIAGNOSIS — R7309 Other abnormal glucose: Secondary | ICD-10-CM | POA: Diagnosis not present

## 2020-08-22 DIAGNOSIS — F4323 Adjustment disorder with mixed anxiety and depressed mood: Secondary | ICD-10-CM | POA: Diagnosis not present

## 2020-08-27 ENCOUNTER — Other Ambulatory Visit: Payer: Self-pay | Admitting: Internal Medicine

## 2020-08-27 DIAGNOSIS — F17210 Nicotine dependence, cigarettes, uncomplicated: Secondary | ICD-10-CM

## 2020-09-12 ENCOUNTER — Other Ambulatory Visit: Payer: Self-pay

## 2020-09-12 ENCOUNTER — Ambulatory Visit
Admission: RE | Admit: 2020-09-12 | Discharge: 2020-09-12 | Disposition: A | Discharge status: Home / Self Care | Payer: PPO | Source: Ambulatory Visit | Attending: Internal Medicine | Admitting: Internal Medicine

## 2020-09-12 DIAGNOSIS — F17210 Nicotine dependence, cigarettes, uncomplicated: Secondary | ICD-10-CM

## 2020-09-16 DIAGNOSIS — I10 Essential (primary) hypertension: Secondary | ICD-10-CM | POA: Diagnosis not present

## 2020-09-16 DIAGNOSIS — M810 Age-related osteoporosis without current pathological fracture: Secondary | ICD-10-CM | POA: Diagnosis not present

## 2020-09-16 DIAGNOSIS — E039 Hypothyroidism, unspecified: Secondary | ICD-10-CM | POA: Diagnosis not present

## 2020-09-26 DIAGNOSIS — E782 Mixed hyperlipidemia: Secondary | ICD-10-CM | POA: Diagnosis not present

## 2020-09-26 DIAGNOSIS — M5459 Other low back pain: Secondary | ICD-10-CM | POA: Diagnosis not present

## 2020-09-26 DIAGNOSIS — I700 Atherosclerosis of aorta: Secondary | ICD-10-CM | POA: Diagnosis not present

## 2020-10-04 DIAGNOSIS — F4323 Adjustment disorder with mixed anxiety and depressed mood: Secondary | ICD-10-CM | POA: Diagnosis not present

## 2020-10-05 ENCOUNTER — Ambulatory Visit (HOSPITAL_COMMUNITY)
Admission: EM | Admit: 2020-10-05 | Discharge: 2020-10-05 | Disposition: A | Discharge status: Home / Self Care | Payer: PPO | Attending: Emergency Medicine | Admitting: Emergency Medicine

## 2020-10-05 ENCOUNTER — Encounter (HOSPITAL_COMMUNITY): Payer: Self-pay

## 2020-10-05 ENCOUNTER — Other Ambulatory Visit: Payer: Self-pay

## 2020-10-05 DIAGNOSIS — N390 Urinary tract infection, site not specified: Secondary | ICD-10-CM | POA: Insufficient documentation

## 2020-10-05 LAB — POCT URINALYSIS DIPSTICK, ED / UC
Bilirubin Urine: NEGATIVE
Glucose, UA: NEGATIVE mg/dL
Ketones, ur: NEGATIVE mg/dL
Nitrite: NEGATIVE
Protein, ur: NEGATIVE mg/dL
Specific Gravity, Urine: 1.01 (ref 1.005–1.030)
Urobilinogen, UA: 0.2 mg/dL (ref 0.0–1.0)
pH: 6 (ref 5.0–8.0)

## 2020-10-05 MED ORDER — CIPROFLOXACIN HCL 500 MG PO TABS: 500.0000 mg | ORAL_TABLET | Freq: Two times a day (BID) | ORAL | 0 refills | Status: DC

## 2020-10-05 MED ORDER — CIPROFLOXACIN HCL 500 MG PO TABS: 500.0000 mg | ORAL_TABLET | Freq: Two times a day (BID) | ORAL | 0 refills | Status: AC

## 2020-10-05 NOTE — ED Provider Notes (Signed)
[name]    CSN: [id number] Arrival date & time: 10/05/20  1527      History   Chief Complaint Chief Complaint  Patient presents with   Urinary Frequency    HPI Amanda Kim is a 75 y.o. female.   Patient here for evaluation of urinary frequency, urgency, and dysuria that started this am.  Reports taking AZO and drinking cranberry juice with minimal symptom relief.  Reports similar symptoms with UTI in the past.  Denies any trauma, injury, or other precipitating event.  Denies any specific alleviating or aggravating factors.  Denies any fevers, chest pain, shortness of breath, N/V/D, numbness, tingling, weakness, abdominal pain, or headaches.    The history is provided by the patient.  Urinary Frequency   Past Medical History:  Diagnosis Date   Back pain    d/t horse fell on pt in the 80's   Bone spur    in neck(since 1990) and 1st 3 fingers on right hand go numb   Chronic pain    d/t horse accident in the 80's   Headache(784.0)    tension   History of colon polyps    Hyperlipidemia    takes Fish Oil and CoQ10   Hypertension    takes Inderal daily   Hypothyroid 05/07/2011   takes Synthroid daily   Leg cramps    takes Potassium prn   Liver disease    nonalcholic fatty liver disease   Nausea & vomiting    phenergan and zofran prn   Spondylolisthesis    Spondylosis of thoracolumbar region without myelopathy or radiculopathy    spine   Urinary frequency     Patient Active Problem List   Diagnosis Date Noted   Gasping for breath 10/07/2017   Hx of chronic arthritis 10/07/2017   Fatigue due to depression 10/07/2017   Excessive daytime sleepiness 10/07/2017   Cigarette smoker 10/07/2017   Chronic back pain 03/21/2014   Depression screening 03/21/2014   UTI (urinary tract infection) 12/06/2013   Cold sore 08/31/2013   Insomnia 03/03/2013   Depression 02/02/2013   Acute anxiety 01/04/2013   Odynophagia 12/14/2012   Essential  hypertension, benign 05/06/2012   Gallstones 04/13/2012   Back pain 01/13/2012   Weight gain 09/05/2011   Hyperlipidemia 09/05/2011   Hypothyroid 05/07/2011   Chronic pain 05/07/2011    Past Surgical History:  Procedure Laterality Date   [place] N/A 05/20/2012   Procedure: LAPAROSCOPIC CHOLECYSTECTOMY WITH INTRAOPERATIVE CHOLANGIOGRAM;  Surgeon: Merrie Roof, MD;  Location: [facility];  Service: General;  Laterality: N/A;   COLONOSCOPY     TUBAL LIGATION  1981    OB History   No obstetric history on file.      Home Medications    Prior to Admission medications   Medication Sig Start Date End Date Taking? Authorizing Provider  ciprofloxacin (CIPRO) 500 MG tablet Take 1 tablet (500 mg total) by mouth 2 (two) times daily for 3 days. 10/05/20 10/08/20 Yes Pearson Forster, NP  levothyroxine (SYNTHROID, LEVOTHROID) 100 MCG tablet Take 100 mcg by mouth every morning. 09/07/17  Yes [provider]  losartan (COZAAR) 100 MG tablet Take 100 mg by mouth daily.   Yes [provider]  propranolol (INDERAL) 10 MG tablet Take 1 tablet (10 mg total) by mouth 2 (two) times daily. 11/27/14  Yes Haney, Alyssa A, MD  calcium citrate-vitamin D (CITRACAL+D) 315-200 MG-UNIT tablet Take by mouth.  [provider]  cephALEXin (KEFLEX) 500 MG capsule Take 1 capsule (500 mg total) by mouth 2 (two) times daily. 09/21/19   Magnus Sinning, MD  Cholecalciferol (VITAMIN D3) 1.25 MG (50000 UT) CAPS Take 1 capsule by mouth once a week. 06/13/19   [provider]  cyclobenzaprine (FLEXERIL) 10 MG tablet Take 1 tablet (10 mg total) by mouth 2 (two) times daily as needed for muscle spasms. 12/19/15   Mackuen, Courteney Lyn, MD  diazepam (VALIUM) 5 MG tablet Take 1 by mouth 1 hour  pre-procedure with very light food. May bring 2nd tablet to appointment. 08/08/19   Magnus Sinning, MD  diclofenac (CATAFLAM) 50 MG tablet Take 50 mg by mouth 2 (two)  times daily. 04/27/19   [provider]  Estradiol (VAGIFEM) 10 MCG TABS vaginal tablet Place 1 tablet vaginally 2 (two) times a week.    [provider]  nitroGLYCERIN (NITROSTAT) 0.4 MG SL tablet SMARTSIG:1 Tablet(s) Sublingual PRN 05/30/19   [provider]  Nutritional Supplements (DHEA PO) Take 1 tablet by mouth daily.    [provider]  oxyCODONE-acetaminophen (PERCOCET/ROXICET) 5-325 MG tablet Take 1 tablet by mouth daily. 06/20/19   [provider]  pantoprazole (PROTONIX) 40 MG tablet Take 40 mg by mouth daily. 05/26/19   [provider]  TURMERIC PO Take 1 capsule by mouth 2 (two) times daily.    [provider]  valACYclovir (VALTREX) 1000 MG tablet TK 1 T PO BID 08/24/17   [provider]    Family History Family History  Problem Relation Age of Onset   Hypertension Mother    Alcohol abuse Mother    Depression Father    Bipolar disorder Father    Colon cancer Paternal Grandmother    Cancer Paternal Grandmother        colon   Heart attack Paternal Grandfather    Heart disease Paternal Uncle    Hyperlipidemia Paternal Uncle     Social History Social History   Tobacco Use   Smoking status: Former    Packs/day: 0.80    Years: 45.00    Pack years: 36.00    Types: Cigarettes   Smokeless tobacco: Never   Tobacco comments:    Quit smoking on 02/2014  Substance Use Topics   Alcohol use: Yes    Alcohol/week: 0.0 standard drinks    Comment: red wine couple of times a week   Drug use: No     Allergies   Sulfisoxazole, Bee venom, and Penicillins   Review of Systems Review of Systems  Genitourinary:  Positive for dysuria, frequency and urgency. Negative for flank pain.  Musculoskeletal:  Negative for back pain.  All other systems reviewed and are negative.   Physical Exam Triage Vital Signs ED Triage Vitals  Enc Vitals Group     BP --      Pulse Rate 10/05/20 1639 (!) 57     Resp 10/05/20 1639  18     Temp 10/05/20 1639 98.6 F (37 C)     Temp Source 10/05/20 1639 Oral     SpO2 10/05/20 1639 96 %     Weight --      Height --      Head Circumference --      Peak Flow --      Pain Score 10/05/20 1635 7     Pain Loc --      Pain Edu? --      Excl. in [place]? --  No data found.  Updated Vital Signs Pulse (!) 57   Temp 98.6 F (37 C) (Oral)   Resp 18   SpO2 96%   Visual Acuity Right Eye Distance:   Left Eye Distance:   Bilateral Distance:    Right Eye Near:   Left Eye Near:    Bilateral Near:     Physical Exam Vitals and nursing note reviewed.  Constitutional:      General: She is not in acute distress.    Appearance: Normal appearance. She is not ill-appearing, toxic-appearing or diaphoretic.  HENT:     Head: Normocephalic and atraumatic.  Eyes:     Conjunctiva/sclera: Conjunctivae normal.  Cardiovascular:     Rate and Rhythm: Normal rate.     Pulses: Normal pulses.     Heart sounds: Normal heart sounds.  Pulmonary:     Effort: Pulmonary effort is normal.     Breath sounds: Normal breath sounds.  Abdominal:     General: Abdomen is flat.     Tenderness: There is no right CVA tenderness or left CVA tenderness.  Musculoskeletal:        General: Normal range of motion.     Cervical back: Normal range of motion.  Skin:    General: Skin is warm and dry.  Neurological:     General: No focal deficit present.     Mental Status: She is alert and oriented to person, place, and time.  Psychiatric:        Mood and Affect: Mood normal.     UC Treatments / Results  Labs (all labs ordered are listed, but only abnormal results are displayed) Labs Reviewed  POCT URINALYSIS DIPSTICK, ED / UC - Abnormal; Notable for the following components:      Result Value   Hgb urine dipstick LARGE (*)    Leukocytes,Ua MODERATE (*)    All other components within normal limits  URINE CULTURE    EKG   Radiology No results found.  Procedures Procedures (including  critical care time)  Medications Ordered in UC Medications - No data to display  Initial Impression / Assessment and Plan / UC Course  I have reviewed the triage vital signs and the nursing notes.  Pertinent labs & imaging results that were available during my care of the patient were reviewed by me and considered in my medical decision making (see chart for details).    Assessment negative for red flags or concerns.  Urinalysis positive for hemoglobin and leukocytes, urine culture pending.  With symptoms and UA results, will treat for likely UTI with Cipro twice daily for the next 3 days.  Encouraged fluids and rest.  Discussed conservative symptom management.  Follow up as needed.  Final Clinical Impressions(s) / UC Diagnoses   Final diagnoses:  Lower urinary tract infectious disease     Discharge Instructions      Take the cipro twice a day for the next 3 days.    You can take Tylenol and/or Ibuprofen as needed for pain relief and fever reduction.   Make sure you are drinking plenty of fluids, especially water.  You can drink cranberry juice to help with symptom relief, but make sure it is cranberry juice and not cranberry cocktail.  You can also try AZO, cranberry pills, or pyridium as needed.    Return or go to the Emergency Department if symptoms worsen or do not improve in the next few days.      ED Prescriptions  Medication Sig Dispense Auth. Provider   ciprofloxacin (CIPRO) 500 MG tablet Take 1 tablet (500 mg total) by mouth 2 (two) times daily for 3 days. 6 tablet Pearson Forster, NP      PDMP not reviewed this encounter.   Pearson Forster, NP 10/05/20 1750

## 2020-10-05 NOTE — Discharge Instructions (Addendum)
Take the cipro twice a day for the next 3 days.    You can take Tylenol and/or Ibuprofen as needed for pain relief and fever reduction.   Make sure you are drinking plenty of fluids, especially water.  You can drink cranberry juice to help with symptom relief, but make sure it is cranberry juice and not cranberry cocktail.  You can also try AZO, cranberry pills, or pyridium as needed.    Return or go to the Emergency Department if symptoms worsen or do not improve in the next few days.

## 2020-10-05 NOTE — ED Triage Notes (Signed)
Pt states today she woke up with a bladder infection.  Symptoms: burning with urination, urinary urgency Started: today

## 2020-10-08 LAB — URINE CULTURE: Culture: >=100000 — AB

## 2020-10-09 ENCOUNTER — Telehealth (HOSPITAL_COMMUNITY): Payer: Self-pay | Admitting: Emergency Medicine

## 2020-10-09 MED ORDER — NITROFURANTOIN MONOHYD MACRO 100 MG PO CAPS: 100.0000 mg | ORAL_CAPSULE | Freq: Two times a day (BID) | ORAL | 0 refills | Status: DC

## 2020-10-09 NOTE — Telephone Encounter (Signed)
Patient needed prescription sent to a different pharmacy due to being out of town

## 2020-10-17 DIAGNOSIS — G4733 Obstructive sleep apnea (adult) (pediatric): Secondary | ICD-10-CM | POA: Diagnosis not present

## 2020-10-17 DIAGNOSIS — E039 Hypothyroidism, unspecified: Secondary | ICD-10-CM | POA: Diagnosis not present

## 2020-10-17 DIAGNOSIS — I10 Essential (primary) hypertension: Secondary | ICD-10-CM | POA: Diagnosis not present

## 2020-10-17 DIAGNOSIS — M810 Age-related osteoporosis without current pathological fracture: Secondary | ICD-10-CM | POA: Diagnosis not present

## 2020-10-25 DIAGNOSIS — R61 Generalized hyperhidrosis: Secondary | ICD-10-CM | POA: Diagnosis not present

## 2020-10-29 DIAGNOSIS — D72829 Elevated white blood cell count, unspecified: Secondary | ICD-10-CM | POA: Diagnosis not present

## 2020-10-29 DIAGNOSIS — R031 Nonspecific low blood-pressure reading: Secondary | ICD-10-CM | POA: Diagnosis not present

## 2020-10-29 DIAGNOSIS — R61 Generalized hyperhidrosis: Secondary | ICD-10-CM | POA: Diagnosis not present

## 2020-11-01 DIAGNOSIS — F4323 Adjustment disorder with mixed anxiety and depressed mood: Secondary | ICD-10-CM | POA: Diagnosis not present

## 2020-11-02 DIAGNOSIS — H40053 Ocular hypertension, bilateral: Secondary | ICD-10-CM | POA: Diagnosis not present

## 2020-11-02 DIAGNOSIS — H40013 Open angle with borderline findings, low risk, bilateral: Secondary | ICD-10-CM | POA: Diagnosis not present

## 2020-11-16 DIAGNOSIS — M7062 Trochanteric bursitis, left hip: Secondary | ICD-10-CM | POA: Diagnosis not present

## 2020-11-16 DIAGNOSIS — I10 Essential (primary) hypertension: Secondary | ICD-10-CM | POA: Diagnosis not present

## 2020-11-16 DIAGNOSIS — G4733 Obstructive sleep apnea (adult) (pediatric): Secondary | ICD-10-CM | POA: Diagnosis not present

## 2020-11-16 DIAGNOSIS — M810 Age-related osteoporosis without current pathological fracture: Secondary | ICD-10-CM | POA: Diagnosis not present

## 2020-11-16 DIAGNOSIS — E039 Hypothyroidism, unspecified: Secondary | ICD-10-CM | POA: Diagnosis not present

## 2020-12-05 DIAGNOSIS — M7061 Trochanteric bursitis, right hip: Secondary | ICD-10-CM | POA: Diagnosis not present

## 2020-12-05 DIAGNOSIS — H1013 Acute atopic conjunctivitis, bilateral: Secondary | ICD-10-CM | POA: Diagnosis not present

## 2020-12-05 DIAGNOSIS — Z23 Encounter for immunization: Secondary | ICD-10-CM | POA: Diagnosis not present

## 2020-12-05 DIAGNOSIS — D1801 Hemangioma of skin and subcutaneous tissue: Secondary | ICD-10-CM | POA: Diagnosis not present

## 2020-12-12 DIAGNOSIS — F4323 Adjustment disorder with mixed anxiety and depressed mood: Secondary | ICD-10-CM | POA: Diagnosis not present

## 2020-12-14 DIAGNOSIS — Z23 Encounter for immunization: Secondary | ICD-10-CM | POA: Diagnosis not present

## 2020-12-17 DIAGNOSIS — E039 Hypothyroidism, unspecified: Secondary | ICD-10-CM | POA: Diagnosis not present

## 2020-12-17 DIAGNOSIS — I10 Essential (primary) hypertension: Secondary | ICD-10-CM | POA: Diagnosis not present

## 2020-12-17 DIAGNOSIS — M810 Age-related osteoporosis without current pathological fracture: Secondary | ICD-10-CM | POA: Diagnosis not present

## 2020-12-17 DIAGNOSIS — G4733 Obstructive sleep apnea (adult) (pediatric): Secondary | ICD-10-CM | POA: Diagnosis not present

## 2020-12-26 DIAGNOSIS — I10 Essential (primary) hypertension: Secondary | ICD-10-CM | POA: Diagnosis not present

## 2021-01-02 DIAGNOSIS — E782 Mixed hyperlipidemia: Secondary | ICD-10-CM | POA: Diagnosis not present

## 2021-01-02 DIAGNOSIS — E039 Hypothyroidism, unspecified: Secondary | ICD-10-CM | POA: Diagnosis not present

## 2021-01-02 DIAGNOSIS — I700 Atherosclerosis of aorta: Secondary | ICD-10-CM | POA: Diagnosis not present

## 2021-01-02 DIAGNOSIS — J432 Centrilobular emphysema: Secondary | ICD-10-CM | POA: Diagnosis not present

## 2021-01-02 DIAGNOSIS — R7309 Other abnormal glucose: Secondary | ICD-10-CM | POA: Diagnosis not present

## 2021-01-31 DIAGNOSIS — M7062 Trochanteric bursitis, left hip: Secondary | ICD-10-CM | POA: Diagnosis not present

## 2021-02-21 DIAGNOSIS — M7062 Trochanteric bursitis, left hip: Secondary | ICD-10-CM | POA: Diagnosis not present

## 2021-03-11 DIAGNOSIS — F4323 Adjustment disorder with mixed anxiety and depressed mood: Secondary | ICD-10-CM | POA: Diagnosis not present

## 2021-03-13 DIAGNOSIS — N9089 Other specified noninflammatory disorders of vulva and perineum: Secondary | ICD-10-CM | POA: Diagnosis not present

## 2021-03-20 DIAGNOSIS — N9089 Other specified noninflammatory disorders of vulva and perineum: Secondary | ICD-10-CM | POA: Diagnosis not present

## 2021-03-20 DIAGNOSIS — A630 Anogenital (venereal) warts: Secondary | ICD-10-CM | POA: Diagnosis not present

## 2021-03-21 DIAGNOSIS — L603 Nail dystrophy: Secondary | ICD-10-CM | POA: Diagnosis not present

## 2021-04-09 DIAGNOSIS — R0989 Other specified symptoms and signs involving the circulatory and respiratory systems: Secondary | ICD-10-CM | POA: Diagnosis not present

## 2021-04-09 DIAGNOSIS — R051 Acute cough: Secondary | ICD-10-CM | POA: Diagnosis not present

## 2021-04-10 DIAGNOSIS — F4323 Adjustment disorder with mixed anxiety and depressed mood: Secondary | ICD-10-CM | POA: Diagnosis not present

## 2021-04-15 DIAGNOSIS — M7072 Other bursitis of hip, left hip: Secondary | ICD-10-CM | POA: Diagnosis not present

## 2021-04-16 DIAGNOSIS — L821 Other seborrheic keratosis: Secondary | ICD-10-CM | POA: Diagnosis not present

## 2021-04-16 DIAGNOSIS — D225 Melanocytic nevi of trunk: Secondary | ICD-10-CM | POA: Diagnosis not present

## 2021-04-16 DIAGNOSIS — G4733 Obstructive sleep apnea (adult) (pediatric): Secondary | ICD-10-CM | POA: Diagnosis not present

## 2021-04-16 DIAGNOSIS — I10 Essential (primary) hypertension: Secondary | ICD-10-CM | POA: Diagnosis not present

## 2021-04-16 DIAGNOSIS — L578 Other skin changes due to chronic exposure to nonionizing radiation: Secondary | ICD-10-CM | POA: Diagnosis not present

## 2021-04-16 DIAGNOSIS — L814 Other melanin hyperpigmentation: Secondary | ICD-10-CM | POA: Diagnosis not present

## 2021-04-16 DIAGNOSIS — Z85828 Personal history of other malignant neoplasm of skin: Secondary | ICD-10-CM | POA: Diagnosis not present

## 2021-04-16 DIAGNOSIS — D1801 Hemangioma of skin and subcutaneous tissue: Secondary | ICD-10-CM | POA: Diagnosis not present

## 2021-04-16 DIAGNOSIS — Z23 Encounter for immunization: Secondary | ICD-10-CM | POA: Diagnosis not present

## 2021-04-16 DIAGNOSIS — L603 Nail dystrophy: Secondary | ICD-10-CM | POA: Diagnosis not present

## 2021-04-16 DIAGNOSIS — M810 Age-related osteoporosis without current pathological fracture: Secondary | ICD-10-CM | POA: Diagnosis not present

## 2021-04-16 DIAGNOSIS — E039 Hypothyroidism, unspecified: Secondary | ICD-10-CM | POA: Diagnosis not present

## 2021-04-22 DIAGNOSIS — M7072 Other bursitis of hip, left hip: Secondary | ICD-10-CM | POA: Diagnosis not present

## 2021-05-08 DIAGNOSIS — R29898 Other symptoms and signs involving the musculoskeletal system: Secondary | ICD-10-CM | POA: Diagnosis not present

## 2021-05-08 DIAGNOSIS — M25551 Pain in right hip: Secondary | ICD-10-CM | POA: Diagnosis not present

## 2021-05-08 DIAGNOSIS — W19XXXD Unspecified fall, subsequent encounter: Secondary | ICD-10-CM | POA: Diagnosis not present

## 2021-05-10 DIAGNOSIS — M25551 Pain in right hip: Secondary | ICD-10-CM | POA: Diagnosis not present

## 2021-05-13 ENCOUNTER — Telehealth: Payer: Self-pay | Admitting: Physical Medicine and Rehabilitation

## 2021-05-13 NOTE — Telephone Encounter (Signed)
Patient called needing to schedule an appointment with Dr. Ernestina Patches for her back. The number to contact patient [phone] ?

## 2021-05-21 ENCOUNTER — Encounter: Payer: Self-pay | Admitting: Physical Medicine and Rehabilitation

## 2021-05-21 ENCOUNTER — Ambulatory Visit: Payer: PPO | Admitting: Physical Medicine and Rehabilitation

## 2021-05-21 VITALS — BP 119/70 | HR 92

## 2021-05-21 DIAGNOSIS — G8929 Other chronic pain: Secondary | ICD-10-CM

## 2021-05-21 DIAGNOSIS — R052 Subacute cough: Secondary | ICD-10-CM | POA: Diagnosis not present

## 2021-05-21 DIAGNOSIS — Z9689 Presence of other specified functional implants: Secondary | ICD-10-CM | POA: Diagnosis not present

## 2021-05-21 DIAGNOSIS — M4316 Spondylolisthesis, lumbar region: Secondary | ICD-10-CM | POA: Diagnosis not present

## 2021-05-21 DIAGNOSIS — M5416 Radiculopathy, lumbar region: Secondary | ICD-10-CM

## 2021-05-21 DIAGNOSIS — M5441 Lumbago with sciatica, right side: Secondary | ICD-10-CM

## 2021-05-21 DIAGNOSIS — M25551 Pain in right hip: Secondary | ICD-10-CM | POA: Diagnosis not present

## 2021-05-21 DIAGNOSIS — G894 Chronic pain syndrome: Secondary | ICD-10-CM

## 2021-05-21 DIAGNOSIS — M48061 Spinal stenosis, lumbar region without neurogenic claudication: Secondary | ICD-10-CM

## 2021-05-21 NOTE — Progress Notes (Signed)
? ?Amanda Kim - 76 y.o. female MRN [id number]  Date of birth: 18-Aug-1945 ? ?Office Visit Note: ?Visit Date: 05/21/2021 ?PCP: Merrilee Seashore, MD ?Referred by: Merrilee Seashore, MD ? ?Subjective: ?Chief Complaint  ?Patient presents with  ? Lower Back - Pain  ? Right Leg - Pain  ? ?HPI: Amanda Kim is a 76 y.o. female who comes in today for evaluation of acute on chronic right sided lower back radiating to buttock and down right leg.  Patient reports pain increased during her trip to [place] approximately 2 weeks ago when her right leg cramped and "gave way" while walking up stairs.  Patient also mentioned chronic issues with cramping to bilateral legs and hands, states these issues have been ongoing for several years.  Patient states her pain is exacerbated by movement and activity.  She describes her pain as a constant burning, cramping and aching sensation, currently rates as 8 out of 10.  Patient reports some relief of pain with home exercise regimen, rest and use of medications. Patient was evaluated at [facility] on 05/10/2021 for same issue, negative pelvis and right hip x-ray images, was sent home with Robaxin.  Patient's lumbar MRI from 2020 exhibits multi-level facet arthropathy, marked facet arthropathy with ligamentum flavum infolding at L5-S1, there is also foraminal stenosis on the right at this same level. No high grade spinal canal stenosis noted. Patient has not been seen in our office since 2021 when she successfully completed spinal cord stimulator trial then later had device implanted by Dr. Sherley Bounds. Patient states spinal cord stimulator device does help to alleviate pain, however device has not been reprogrammed since implantation. Patient states her pain is making it difficult to accomplish daily tasks.  Patient states she has recently spoken with Tillie Rung from [employer] and is scheduled to have device reprogrammed today. Patient denies focal weakness,  numbness and tingling.  ? ? ? ?Review of Systems  ?Musculoskeletal:  Positive for back pain and myalgias.  ?Neurological:  Negative for tingling, sensory change, focal weakness and weakness.  ?All other systems reviewed and are negative. Otherwise per HPI. ? ?Assessment & Plan: ?Visit Diagnoses:  ?  ICD-10-CM   ?1. Lumbar radiculopathy  M54.16   ?  ?2. Chronic right-sided low back pain with right-sided sciatica  M54.41   ? G89.29   ?  ?3. Foraminal stenosis of lumbar region  M48.061   ?  ?4. Spondylolisthesis of lumbar region  M43.16   ?  ?5. Chronic pain syndrome  G89.4   ?  ?6. Status post insertion of spinal cord stimulator  Z96.89   ?  ?   ?Plan: Findings:  ?Acute on chronic right sided lower back pain radiating to buttock and down right leg. Patient continues to have severe pain despite good conservative therapies such as home exercise regimen, rest and use of medications. Patients clinical presentation are consistent with L5 nerve pattern, however we do feel her symptoms could be related to acute exacerbation of her chronic issues due to recent walking and climbing stairs while on vacation. We did arrange for reprogramming today which she tolerated without difficulty. Kristen from [employer] did discuss various programming with patient today and encouraged her to try different programs at home. Patient instructed to let us know if her pain increases or changes in nature. She is encouraged to remain active and continue home exercise regimen as tolerated. Patient encouraged to follow up with primary care provider regarding chronic issues with cramping to bilateral  legs and hands. No red flag symptoms noted upon exam today.   ? ?Meds & Orders: No orders of the defined types were placed in this encounter. ? No orders of the defined types were placed in this encounter. ?  ?Follow-up: Return if symptoms worsen or fail to improve.  ? ?Procedures: ?No procedures performed  ?   ? ?Clinical History: ?No specialty  comments available.  ? ?She reports that she has quit smoking. Her smoking use included cigarettes. She has a 36.00 pack-year smoking history. She has never used smokeless tobacco. No results for input(s): HGBA1C, LABURIC in the last 8760 hours. ? ?Objective:  VS:  HT:    WT:   BMI:     BP:119/70  HR:92bpm  TEMP: ( )  RESP:  ?Physical Exam ?Vitals and nursing note reviewed.  ?HENT:  ?   Head: Normocephalic and atraumatic.  ?   Right Ear: External ear normal.  ?   Left Ear: External ear normal.  ?   Nose: Nose normal.  ?   Mouth/Throat:  ?   Mouth: Mucous membranes are moist.  ?Eyes:  ?   Extraocular Movements: Extraocular movements intact.  ?Cardiovascular:  ?   Rate and Rhythm: Normal rate.  ?   Pulses: Normal pulses.  ?Pulmonary:  ?   Effort: Pulmonary effort is normal.  ?Abdominal:  ?   General: Abdomen is flat. There is no distension.  ?Musculoskeletal:     ?   General: Tenderness present.  ?   Cervical back: Normal range of motion.  ?   Comments: Pt rises from seated position to standing without difficulty. Good lumbar range of motion. Strong distal strength without clonus, no pain upon palpation of greater trochanters. Dysesthesias noted to right L5 dermatome. Sensation intact bilaterally. Walks independently, gait steady.   ?Skin: ?   General: Skin is warm and dry.  ?   Capillary Refill: Capillary refill takes less than 2 seconds.  ?Neurological:  ?   General: No focal deficit present.  ?   Mental Status: She is alert and oriented to person, place, and time.  ?Psychiatric:     ?   Mood and Affect: Mood normal.     ?   Behavior: Behavior normal.  ?  ?Ortho Exam ? ?Imaging: ?No results found. ? ?Past Medical/Family/Surgical/Social History: ?Medications & Allergies reviewed per EMR, new medications updated. ?Patient Active Problem List  ? Diagnosis Date Noted  ? Gasping for breath 10/07/2017  ? Hx of chronic arthritis 10/07/2017  ? Fatigue due to depression 10/07/2017  ? Excessive daytime sleepiness  10/07/2017  ? Cigarette smoker 10/07/2017  ? Chronic back pain 03/21/2014  ? Depression screening 03/21/2014  ? UTI (urinary tract infection) 12/06/2013  ? Cold sore 08/31/2013  ? Insomnia 03/03/2013  ? Depression 02/02/2013  ? Acute anxiety 01/04/2013  ? Odynophagia 12/14/2012  ? Essential hypertension, benign 05/06/2012  ? Gallstones 04/13/2012  ? Back pain 01/13/2012  ? Weight gain 09/05/2011  ? Hyperlipidemia 09/05/2011  ? Hypothyroid 05/07/2011  ? Chronic pain 05/07/2011  ? ?Past Medical History:  ?Diagnosis Date  ? Back pain   ? d/t horse fell on pt in the 80's  ? Bone spur   ? in neck(since 1990) and 1st 3 fingers on right hand go numb  ? Chronic pain   ? d/t horse accident in the 80's  ? Headache(784.0)   ? tension  ? History of colon polyps   ? Hyperlipidemia   ? takes Fish  Oil and CoQ10  ? Hypertension   ? takes Inderal daily  ? Hypothyroid 05/07/2011  ? takes Synthroid daily  ? Leg cramps   ? takes Potassium prn  ? Liver disease   ? nonalcholic fatty liver disease  ? Nausea & vomiting   ? phenergan and zofran prn  ? Spondylolisthesis   ? Spondylosis of thoracolumbar region without myelopathy or radiculopathy   ? spine  ? Urinary frequency   ? ?Family History  ?Problem Relation Age of Onset  ? Hypertension Mother   ? Alcohol abuse Mother   ? Depression Father   ? Bipolar disorder Father   ? Colon cancer Paternal Grandmother   ? Cancer Paternal Grandmother   ?     colon  ? Heart attack Paternal Grandfather   ? Heart disease Paternal Uncle   ? Hyperlipidemia Paternal Uncle   ? ?Past Surgical History:  ?Procedure Laterality Date  ? [place]  ? CHOLECYSTECTOMY N/A 05/20/2012  ? Procedure: LAPAROSCOPIC CHOLECYSTECTOMY WITH INTRAOPERATIVE CHOLANGIOGRAM;  Surgeon: Merrie Roof, MD;  Location: [facility];  Service: General;  Laterality: N/A;  ? COLONOSCOPY    ? TUBAL LIGATION  1981  ? ?Social History  ? ?Occupational History  ? Occupation: retired-HR  ? Occupation: small business owner-consulting   ?Tobacco Use  ? Smoking status: Former  ?  Packs/day: 0.80  ?  Years: 45.00  ?  Pack years: 36.00  ?  Types: Cigarettes  ? Smokeless tobacco: Never  ? Tobacco comments:  ?  Quit smoking on 02/2014  ?Substance and Sexu

## 2021-05-21 NOTE — Progress Notes (Signed)
Pt state lower back pain that travels to her right leg. Pt state walking and climbing stairs makes the pain worse. Pt state she uses heat and takes pain meds to help ease her pain. ? ?Numeric Pain Rating Scale and Functional Assessment ?Average Pain 8 ?Pain Right Now 4 ?My pain is intermittent, burning, and aching ?Pain is worse with: walking, standing, and some activites ?Pain improves with: heat/ice and medication ? ? ?In the last MONTH (on 0-10 scale) has pain interfered with the following? ? ?1. General activity like being  able to carry out your everyday physical activities such as walking, climbing stairs, carrying groceries, or moving a chair?  ?Rating(5) ? ?2. Relation with others like being able to carry out your usual social activities and roles such as  activities at home, at work and in your community. ?Rating(6) ? ?3. Enjoyment of life such that you have  been bothered by emotional problems such as feeling anxious, depressed or irritable?  ?Rating(7) ? ?

## 2021-05-22 ENCOUNTER — Encounter (INDEPENDENT_AMBULATORY_CARE_PROVIDER_SITE_OTHER): Payer: Self-pay

## 2021-05-30 DIAGNOSIS — F4323 Adjustment disorder with mixed anxiety and depressed mood: Secondary | ICD-10-CM | POA: Diagnosis not present

## 2021-05-30 DIAGNOSIS — H26491 Other secondary cataract, right eye: Secondary | ICD-10-CM | POA: Diagnosis not present

## 2021-05-30 DIAGNOSIS — H04123 Dry eye syndrome of bilateral lacrimal glands: Secondary | ICD-10-CM | POA: Diagnosis not present

## 2021-05-30 DIAGNOSIS — H40013 Open angle with borderline findings, low risk, bilateral: Secondary | ICD-10-CM | POA: Diagnosis not present

## 2021-05-30 DIAGNOSIS — H26493 Other secondary cataract, bilateral: Secondary | ICD-10-CM | POA: Diagnosis not present

## 2021-05-30 DIAGNOSIS — H524 Presbyopia: Secondary | ICD-10-CM | POA: Diagnosis not present

## 2021-05-30 DIAGNOSIS — H35363 Drusen (degenerative) of macula, bilateral: Secondary | ICD-10-CM | POA: Diagnosis not present

## 2021-06-04 DIAGNOSIS — E782 Mixed hyperlipidemia: Secondary | ICD-10-CM | POA: Diagnosis not present

## 2021-06-04 DIAGNOSIS — I10 Essential (primary) hypertension: Secondary | ICD-10-CM | POA: Diagnosis not present

## 2021-06-04 DIAGNOSIS — E039 Hypothyroidism, unspecified: Secondary | ICD-10-CM | POA: Diagnosis not present

## 2021-06-04 DIAGNOSIS — F419 Anxiety disorder, unspecified: Secondary | ICD-10-CM | POA: Diagnosis not present

## 2021-06-04 DIAGNOSIS — E785 Hyperlipidemia, unspecified: Secondary | ICD-10-CM | POA: Diagnosis not present

## 2021-06-04 DIAGNOSIS — F411 Generalized anxiety disorder: Secondary | ICD-10-CM | POA: Diagnosis not present

## 2021-06-04 DIAGNOSIS — M1990 Unspecified osteoarthritis, unspecified site: Secondary | ICD-10-CM | POA: Diagnosis not present

## 2021-06-04 DIAGNOSIS — F329 Major depressive disorder, single episode, unspecified: Secondary | ICD-10-CM | POA: Diagnosis not present

## 2021-06-04 DIAGNOSIS — G8929 Other chronic pain: Secondary | ICD-10-CM | POA: Diagnosis not present

## 2021-06-04 DIAGNOSIS — F17210 Nicotine dependence, cigarettes, uncomplicated: Secondary | ICD-10-CM | POA: Diagnosis not present

## 2021-06-05 DIAGNOSIS — M6281 Muscle weakness (generalized): Secondary | ICD-10-CM | POA: Diagnosis not present

## 2021-06-05 DIAGNOSIS — M79604 Pain in right leg: Secondary | ICD-10-CM | POA: Diagnosis not present

## 2021-06-05 DIAGNOSIS — M5459 Other low back pain: Secondary | ICD-10-CM | POA: Diagnosis not present

## 2021-06-12 DIAGNOSIS — M6281 Muscle weakness (generalized): Secondary | ICD-10-CM | POA: Diagnosis not present

## 2021-06-12 DIAGNOSIS — M79604 Pain in right leg: Secondary | ICD-10-CM | POA: Diagnosis not present

## 2021-06-12 DIAGNOSIS — M5459 Other low back pain: Secondary | ICD-10-CM | POA: Diagnosis not present

## 2021-06-12 DIAGNOSIS — R252 Cramp and spasm: Secondary | ICD-10-CM | POA: Diagnosis not present

## 2021-06-14 DIAGNOSIS — M5459 Other low back pain: Secondary | ICD-10-CM | POA: Diagnosis not present

## 2021-06-14 DIAGNOSIS — M79604 Pain in right leg: Secondary | ICD-10-CM | POA: Diagnosis not present

## 2021-06-14 DIAGNOSIS — M6281 Muscle weakness (generalized): Secondary | ICD-10-CM | POA: Diagnosis not present

## 2021-06-19 DIAGNOSIS — R7309 Other abnormal glucose: Secondary | ICD-10-CM | POA: Diagnosis not present

## 2021-06-19 DIAGNOSIS — E559 Vitamin D deficiency, unspecified: Secondary | ICD-10-CM | POA: Diagnosis not present

## 2021-06-19 DIAGNOSIS — M5459 Other low back pain: Secondary | ICD-10-CM | POA: Diagnosis not present

## 2021-06-19 DIAGNOSIS — I700 Atherosclerosis of aorta: Secondary | ICD-10-CM | POA: Diagnosis not present

## 2021-06-19 DIAGNOSIS — J432 Centrilobular emphysema: Secondary | ICD-10-CM | POA: Diagnosis not present

## 2021-06-19 DIAGNOSIS — I10 Essential (primary) hypertension: Secondary | ICD-10-CM | POA: Diagnosis not present

## 2021-06-19 DIAGNOSIS — M79604 Pain in right leg: Secondary | ICD-10-CM | POA: Diagnosis not present

## 2021-06-19 DIAGNOSIS — E039 Hypothyroidism, unspecified: Secondary | ICD-10-CM | POA: Diagnosis not present

## 2021-06-19 DIAGNOSIS — M6281 Muscle weakness (generalized): Secondary | ICD-10-CM | POA: Diagnosis not present

## 2021-06-21 DIAGNOSIS — M5459 Other low back pain: Secondary | ICD-10-CM | POA: Diagnosis not present

## 2021-06-21 DIAGNOSIS — M6281 Muscle weakness (generalized): Secondary | ICD-10-CM | POA: Diagnosis not present

## 2021-06-21 DIAGNOSIS — M79604 Pain in right leg: Secondary | ICD-10-CM | POA: Diagnosis not present

## 2021-06-24 DIAGNOSIS — G4762 Sleep related leg cramps: Secondary | ICD-10-CM | POA: Diagnosis not present

## 2021-06-24 DIAGNOSIS — J432 Centrilobular emphysema: Secondary | ICD-10-CM | POA: Diagnosis not present

## 2021-06-24 DIAGNOSIS — F411 Generalized anxiety disorder: Secondary | ICD-10-CM | POA: Diagnosis not present

## 2021-06-24 DIAGNOSIS — E782 Mixed hyperlipidemia: Secondary | ICD-10-CM | POA: Diagnosis not present

## 2021-06-24 DIAGNOSIS — I700 Atherosclerosis of aorta: Secondary | ICD-10-CM | POA: Diagnosis not present

## 2021-06-24 DIAGNOSIS — R7309 Other abnormal glucose: Secondary | ICD-10-CM | POA: Diagnosis not present

## 2021-06-24 DIAGNOSIS — E039 Hypothyroidism, unspecified: Secondary | ICD-10-CM | POA: Diagnosis not present

## 2021-06-26 ENCOUNTER — Other Ambulatory Visit: Payer: Self-pay | Admitting: Internal Medicine

## 2021-06-26 DIAGNOSIS — F4323 Adjustment disorder with mixed anxiety and depressed mood: Secondary | ICD-10-CM | POA: Diagnosis not present

## 2021-06-26 DIAGNOSIS — Z1231 Encounter for screening mammogram for malignant neoplasm of breast: Secondary | ICD-10-CM

## 2021-07-08 ENCOUNTER — Ambulatory Visit
Admission: RE | Admit: 2021-07-08 | Discharge: 2021-07-08 | Disposition: A | Discharge status: Home / Self Care | Payer: PPO | Source: Ambulatory Visit | Attending: Internal Medicine | Admitting: Internal Medicine

## 2021-07-08 DIAGNOSIS — Z1231 Encounter for screening mammogram for malignant neoplasm of breast: Secondary | ICD-10-CM | POA: Diagnosis not present

## 2021-07-18 DIAGNOSIS — M5459 Other low back pain: Secondary | ICD-10-CM | POA: Diagnosis not present

## 2021-07-18 DIAGNOSIS — M6281 Muscle weakness (generalized): Secondary | ICD-10-CM | POA: Diagnosis not present

## 2021-07-18 DIAGNOSIS — M79604 Pain in right leg: Secondary | ICD-10-CM | POA: Diagnosis not present

## 2021-07-22 DIAGNOSIS — L603 Nail dystrophy: Secondary | ICD-10-CM | POA: Diagnosis not present

## 2021-07-22 DIAGNOSIS — D692 Other nonthrombocytopenic purpura: Secondary | ICD-10-CM | POA: Diagnosis not present

## 2021-07-22 DIAGNOSIS — Z79899 Other long term (current) drug therapy: Secondary | ICD-10-CM | POA: Diagnosis not present

## 2021-08-07 DIAGNOSIS — H26492 Other secondary cataract, left eye: Secondary | ICD-10-CM | POA: Diagnosis not present

## 2021-08-12 DIAGNOSIS — Z6825 Body mass index (BMI) 25.0-25.9, adult: Secondary | ICD-10-CM | POA: Diagnosis not present

## 2021-08-12 DIAGNOSIS — N952 Postmenopausal atrophic vaginitis: Secondary | ICD-10-CM | POA: Diagnosis not present

## 2021-08-12 DIAGNOSIS — Z124 Encounter for screening for malignant neoplasm of cervix: Secondary | ICD-10-CM | POA: Diagnosis not present

## 2021-08-12 DIAGNOSIS — Z0142 Encounter for cervical smear to confirm findings of recent normal smear following initial abnormal smear: Secondary | ICD-10-CM | POA: Diagnosis not present

## 2021-08-12 DIAGNOSIS — F520 Hypoactive sexual desire disorder: Secondary | ICD-10-CM | POA: Diagnosis not present

## 2021-08-12 DIAGNOSIS — R6882 Decreased libido: Secondary | ICD-10-CM | POA: Diagnosis not present

## 2021-08-12 DIAGNOSIS — Z01419 Encounter for gynecological examination (general) (routine) without abnormal findings: Secondary | ICD-10-CM | POA: Diagnosis not present

## 2021-08-12 DIAGNOSIS — Z01411 Encounter for gynecological examination (general) (routine) with abnormal findings: Secondary | ICD-10-CM | POA: Diagnosis not present

## 2021-08-12 DIAGNOSIS — N941 Unspecified dyspareunia: Secondary | ICD-10-CM | POA: Diagnosis not present

## 2021-08-14 DIAGNOSIS — F4323 Adjustment disorder with mixed anxiety and depressed mood: Secondary | ICD-10-CM | POA: Diagnosis not present

## 2021-08-16 DIAGNOSIS — G4733 Obstructive sleep apnea (adult) (pediatric): Secondary | ICD-10-CM | POA: Diagnosis not present

## 2021-08-16 DIAGNOSIS — M810 Age-related osteoporosis without current pathological fracture: Secondary | ICD-10-CM | POA: Diagnosis not present

## 2021-08-16 DIAGNOSIS — E039 Hypothyroidism, unspecified: Secondary | ICD-10-CM | POA: Diagnosis not present

## 2021-08-16 DIAGNOSIS — I10 Essential (primary) hypertension: Secondary | ICD-10-CM | POA: Diagnosis not present

## 2021-08-22 DIAGNOSIS — D692 Other nonthrombocytopenic purpura: Secondary | ICD-10-CM | POA: Diagnosis not present

## 2021-08-22 DIAGNOSIS — H9319 Tinnitus, unspecified ear: Secondary | ICD-10-CM | POA: Diagnosis not present

## 2021-08-22 DIAGNOSIS — L659 Nonscarring hair loss, unspecified: Secondary | ICD-10-CM | POA: Diagnosis not present

## 2021-08-28 DIAGNOSIS — M7061 Trochanteric bursitis, right hip: Secondary | ICD-10-CM | POA: Diagnosis not present

## 2021-09-11 DIAGNOSIS — M7062 Trochanteric bursitis, left hip: Secondary | ICD-10-CM | POA: Diagnosis not present

## 2021-10-10 DIAGNOSIS — M7072 Other bursitis of hip, left hip: Secondary | ICD-10-CM | POA: Diagnosis not present

## 2021-10-24 DIAGNOSIS — H40053 Ocular hypertension, bilateral: Secondary | ICD-10-CM | POA: Diagnosis not present

## 2021-10-24 DIAGNOSIS — H40013 Open angle with borderline findings, low risk, bilateral: Secondary | ICD-10-CM | POA: Diagnosis not present

## 2021-11-04 DIAGNOSIS — Z1159 Encounter for screening for other viral diseases: Secondary | ICD-10-CM | POA: Diagnosis not present

## 2021-11-04 DIAGNOSIS — E039 Hypothyroidism, unspecified: Secondary | ICD-10-CM | POA: Diagnosis not present

## 2021-11-04 DIAGNOSIS — E782 Mixed hyperlipidemia: Secondary | ICD-10-CM | POA: Diagnosis not present

## 2021-11-04 DIAGNOSIS — G4762 Sleep related leg cramps: Secondary | ICD-10-CM | POA: Diagnosis not present

## 2021-11-04 DIAGNOSIS — R7309 Other abnormal glucose: Secondary | ICD-10-CM | POA: Diagnosis not present

## 2021-11-04 DIAGNOSIS — J432 Centrilobular emphysema: Secondary | ICD-10-CM | POA: Diagnosis not present

## 2021-11-04 DIAGNOSIS — F411 Generalized anxiety disorder: Secondary | ICD-10-CM | POA: Diagnosis not present

## 2021-11-04 DIAGNOSIS — Z0000 Encounter for general adult medical examination without abnormal findings: Secondary | ICD-10-CM | POA: Diagnosis not present

## 2021-11-04 DIAGNOSIS — I700 Atherosclerosis of aorta: Secondary | ICD-10-CM | POA: Diagnosis not present

## 2021-11-11 DIAGNOSIS — E039 Hypothyroidism, unspecified: Secondary | ICD-10-CM | POA: Diagnosis not present

## 2021-11-11 DIAGNOSIS — M7062 Trochanteric bursitis, left hip: Secondary | ICD-10-CM | POA: Diagnosis not present

## 2021-11-11 DIAGNOSIS — J432 Centrilobular emphysema: Secondary | ICD-10-CM | POA: Diagnosis not present

## 2021-11-11 DIAGNOSIS — Z7189 Other specified counseling: Secondary | ICD-10-CM | POA: Diagnosis not present

## 2021-11-11 DIAGNOSIS — D692 Other nonthrombocytopenic purpura: Secondary | ICD-10-CM | POA: Diagnosis not present

## 2021-11-11 DIAGNOSIS — E782 Mixed hyperlipidemia: Secondary | ICD-10-CM | POA: Diagnosis not present

## 2021-11-11 DIAGNOSIS — G4762 Sleep related leg cramps: Secondary | ICD-10-CM | POA: Diagnosis not present

## 2021-11-11 DIAGNOSIS — R7309 Other abnormal glucose: Secondary | ICD-10-CM | POA: Diagnosis not present

## 2021-11-11 DIAGNOSIS — I10 Essential (primary) hypertension: Secondary | ICD-10-CM | POA: Diagnosis not present

## 2021-11-11 DIAGNOSIS — Z23 Encounter for immunization: Secondary | ICD-10-CM | POA: Diagnosis not present

## 2021-11-11 DIAGNOSIS — Z0000 Encounter for general adult medical examination without abnormal findings: Secondary | ICD-10-CM | POA: Diagnosis not present

## 2021-11-11 DIAGNOSIS — F411 Generalized anxiety disorder: Secondary | ICD-10-CM | POA: Diagnosis not present

## 2021-11-11 DIAGNOSIS — I700 Atherosclerosis of aorta: Secondary | ICD-10-CM | POA: Diagnosis not present

## 2021-11-20 DIAGNOSIS — D692 Other nonthrombocytopenic purpura: Secondary | ICD-10-CM | POA: Diagnosis not present

## 2021-12-25 DIAGNOSIS — Z23 Encounter for immunization: Secondary | ICD-10-CM | POA: Diagnosis not present

## 2022-01-06 ENCOUNTER — Other Ambulatory Visit: Payer: Self-pay | Admitting: Internal Medicine

## 2022-01-06 DIAGNOSIS — F17210 Nicotine dependence, cigarettes, uncomplicated: Secondary | ICD-10-CM

## 2022-01-19 DIAGNOSIS — S82391A Other fracture of lower end of right tibia, initial encounter for closed fracture: Secondary | ICD-10-CM | POA: Diagnosis not present

## 2022-01-19 DIAGNOSIS — S82892A Other fracture of left lower leg, initial encounter for closed fracture: Secondary | ICD-10-CM | POA: Diagnosis not present

## 2022-01-19 DIAGNOSIS — S99912A Unspecified injury of left ankle, initial encounter: Secondary | ICD-10-CM | POA: Diagnosis not present

## 2022-01-19 DIAGNOSIS — S99922A Unspecified injury of left foot, initial encounter: Secondary | ICD-10-CM | POA: Diagnosis not present

## 2022-01-24 DIAGNOSIS — S93602A Unspecified sprain of left foot, initial encounter: Secondary | ICD-10-CM | POA: Diagnosis not present

## 2022-01-24 DIAGNOSIS — S82391A Other fracture of lower end of right tibia, initial encounter for closed fracture: Secondary | ICD-10-CM | POA: Diagnosis not present

## 2022-01-31 DIAGNOSIS — S93602A Unspecified sprain of left foot, initial encounter: Secondary | ICD-10-CM | POA: Diagnosis not present

## 2022-01-31 DIAGNOSIS — S82391A Other fracture of lower end of right tibia, initial encounter for closed fracture: Secondary | ICD-10-CM | POA: Diagnosis not present

## 2022-02-04 DIAGNOSIS — M47817 Spondylosis without myelopathy or radiculopathy, lumbosacral region: Secondary | ICD-10-CM | POA: Diagnosis not present

## 2022-02-04 DIAGNOSIS — M5416 Radiculopathy, lumbar region: Secondary | ICD-10-CM | POA: Diagnosis not present

## 2022-02-13 ENCOUNTER — Other Ambulatory Visit: Payer: PPO

## 2022-02-26 ENCOUNTER — Other Ambulatory Visit: Payer: PPO

## 2022-02-28 DIAGNOSIS — S82392A Other fracture of lower end of left tibia, initial encounter for closed fracture: Secondary | ICD-10-CM | POA: Diagnosis not present

## 2022-03-10 DIAGNOSIS — R7309 Other abnormal glucose: Secondary | ICD-10-CM | POA: Diagnosis not present

## 2022-03-10 DIAGNOSIS — G4762 Sleep related leg cramps: Secondary | ICD-10-CM | POA: Diagnosis not present

## 2022-03-10 DIAGNOSIS — E782 Mixed hyperlipidemia: Secondary | ICD-10-CM | POA: Diagnosis not present

## 2022-03-10 DIAGNOSIS — Z0000 Encounter for general adult medical examination without abnormal findings: Secondary | ICD-10-CM | POA: Diagnosis not present

## 2022-03-10 DIAGNOSIS — I700 Atherosclerosis of aorta: Secondary | ICD-10-CM | POA: Diagnosis not present

## 2022-03-10 DIAGNOSIS — E039 Hypothyroidism, unspecified: Secondary | ICD-10-CM | POA: Diagnosis not present

## 2022-03-10 DIAGNOSIS — F411 Generalized anxiety disorder: Secondary | ICD-10-CM | POA: Diagnosis not present

## 2022-03-17 DIAGNOSIS — I700 Atherosclerosis of aorta: Secondary | ICD-10-CM | POA: Diagnosis not present

## 2022-03-17 DIAGNOSIS — R7309 Other abnormal glucose: Secondary | ICD-10-CM | POA: Diagnosis not present

## 2022-03-17 DIAGNOSIS — I10 Essential (primary) hypertension: Secondary | ICD-10-CM | POA: Diagnosis not present

## 2022-03-17 DIAGNOSIS — E039 Hypothyroidism, unspecified: Secondary | ICD-10-CM | POA: Diagnosis not present

## 2022-03-17 DIAGNOSIS — M791 Myalgia, unspecified site: Secondary | ICD-10-CM | POA: Diagnosis not present

## 2022-03-17 DIAGNOSIS — E782 Mixed hyperlipidemia: Secondary | ICD-10-CM | POA: Diagnosis not present

## 2022-03-17 DIAGNOSIS — D692 Other nonthrombocytopenic purpura: Secondary | ICD-10-CM | POA: Diagnosis not present

## 2022-03-17 DIAGNOSIS — M810 Age-related osteoporosis without current pathological fracture: Secondary | ICD-10-CM | POA: Diagnosis not present

## 2022-03-17 DIAGNOSIS — J432 Centrilobular emphysema: Secondary | ICD-10-CM | POA: Diagnosis not present

## 2022-03-17 DIAGNOSIS — F411 Generalized anxiety disorder: Secondary | ICD-10-CM | POA: Diagnosis not present

## 2022-03-17 DIAGNOSIS — S92901A Unspecified fracture of right foot, initial encounter for closed fracture: Secondary | ICD-10-CM | POA: Diagnosis not present

## 2022-03-17 DIAGNOSIS — M6281 Muscle weakness (generalized): Secondary | ICD-10-CM | POA: Diagnosis not present

## 2022-03-18 ENCOUNTER — Ambulatory Visit
Admission: RE | Admit: 2022-03-18 | Discharge: 2022-03-18 | Disposition: A | Discharge status: Home / Self Care | Payer: PPO | Source: Ambulatory Visit | Attending: Internal Medicine | Admitting: Internal Medicine

## 2022-03-18 DIAGNOSIS — F17210 Nicotine dependence, cigarettes, uncomplicated: Secondary | ICD-10-CM

## 2022-03-18 DIAGNOSIS — M545 Low back pain, unspecified: Secondary | ICD-10-CM | POA: Diagnosis not present

## 2022-03-25 DIAGNOSIS — M545 Low back pain, unspecified: Secondary | ICD-10-CM | POA: Diagnosis not present

## 2022-03-31 DIAGNOSIS — M25511 Pain in right shoulder: Secondary | ICD-10-CM | POA: Diagnosis not present

## 2022-03-31 DIAGNOSIS — M542 Cervicalgia: Secondary | ICD-10-CM | POA: Diagnosis not present

## 2022-04-04 DIAGNOSIS — K224 Dyskinesia of esophagus: Secondary | ICD-10-CM | POA: Diagnosis not present

## 2022-04-04 DIAGNOSIS — R1314 Dysphagia, pharyngoesophageal phase: Secondary | ICD-10-CM | POA: Diagnosis not present

## 2022-04-04 DIAGNOSIS — E782 Mixed hyperlipidemia: Secondary | ICD-10-CM | POA: Diagnosis not present

## 2022-04-04 DIAGNOSIS — R7309 Other abnormal glucose: Secondary | ICD-10-CM | POA: Diagnosis not present

## 2022-04-04 DIAGNOSIS — N182 Chronic kidney disease, stage 2 (mild): Secondary | ICD-10-CM | POA: Diagnosis not present

## 2022-04-04 DIAGNOSIS — M810 Age-related osteoporosis without current pathological fracture: Secondary | ICD-10-CM | POA: Diagnosis not present

## 2022-04-07 DIAGNOSIS — M7072 Other bursitis of hip, left hip: Secondary | ICD-10-CM | POA: Diagnosis not present

## 2022-04-09 DIAGNOSIS — M542 Cervicalgia: Secondary | ICD-10-CM | POA: Diagnosis not present

## 2022-04-09 DIAGNOSIS — M25571 Pain in right ankle and joints of right foot: Secondary | ICD-10-CM | POA: Diagnosis not present

## 2022-04-12 DIAGNOSIS — M542 Cervicalgia: Secondary | ICD-10-CM | POA: Diagnosis not present

## 2022-04-15 DIAGNOSIS — M810 Age-related osteoporosis without current pathological fracture: Secondary | ICD-10-CM | POA: Diagnosis not present

## 2022-04-16 DIAGNOSIS — S161XXD Strain of muscle, fascia and tendon at neck level, subsequent encounter: Secondary | ICD-10-CM | POA: Diagnosis not present

## 2022-04-18 DIAGNOSIS — M7072 Other bursitis of hip, left hip: Secondary | ICD-10-CM | POA: Diagnosis not present

## 2022-04-21 DIAGNOSIS — Z85828 Personal history of other malignant neoplasm of skin: Secondary | ICD-10-CM | POA: Diagnosis not present

## 2022-04-21 DIAGNOSIS — D225 Melanocytic nevi of trunk: Secondary | ICD-10-CM | POA: Diagnosis not present

## 2022-04-21 DIAGNOSIS — L814 Other melanin hyperpigmentation: Secondary | ICD-10-CM | POA: Diagnosis not present

## 2022-04-21 DIAGNOSIS — L821 Other seborrheic keratosis: Secondary | ICD-10-CM | POA: Diagnosis not present

## 2022-04-21 DIAGNOSIS — L578 Other skin changes due to chronic exposure to nonionizing radiation: Secondary | ICD-10-CM | POA: Diagnosis not present

## 2022-04-24 DIAGNOSIS — S161XXD Strain of muscle, fascia and tendon at neck level, subsequent encounter: Secondary | ICD-10-CM | POA: Diagnosis not present

## 2022-04-28 DIAGNOSIS — S82391A Other fracture of lower end of right tibia, initial encounter for closed fracture: Secondary | ICD-10-CM | POA: Diagnosis not present

## 2022-05-01 DIAGNOSIS — M542 Cervicalgia: Secondary | ICD-10-CM | POA: Diagnosis not present

## 2022-06-02 DIAGNOSIS — R2681 Unsteadiness on feet: Secondary | ICD-10-CM | POA: Diagnosis not present

## 2022-06-02 DIAGNOSIS — M6281 Muscle weakness (generalized): Secondary | ICD-10-CM | POA: Diagnosis not present

## 2022-06-02 DIAGNOSIS — R110 Nausea: Secondary | ICD-10-CM | POA: Diagnosis not present

## 2022-06-02 DIAGNOSIS — S39012D Strain of muscle, fascia and tendon of lower back, subsequent encounter: Secondary | ICD-10-CM | POA: Diagnosis not present

## 2022-06-02 DIAGNOSIS — K5900 Constipation, unspecified: Secondary | ICD-10-CM | POA: Diagnosis not present

## 2022-06-12 DIAGNOSIS — S39012D Strain of muscle, fascia and tendon of lower back, subsequent encounter: Secondary | ICD-10-CM | POA: Diagnosis not present

## 2022-06-12 DIAGNOSIS — M6281 Muscle weakness (generalized): Secondary | ICD-10-CM | POA: Diagnosis not present

## 2022-06-12 DIAGNOSIS — R2681 Unsteadiness on feet: Secondary | ICD-10-CM | POA: Diagnosis not present

## 2022-06-18 DIAGNOSIS — R2681 Unsteadiness on feet: Secondary | ICD-10-CM | POA: Diagnosis not present

## 2022-06-18 DIAGNOSIS — M6281 Muscle weakness (generalized): Secondary | ICD-10-CM | POA: Diagnosis not present

## 2022-06-18 DIAGNOSIS — S39012D Strain of muscle, fascia and tendon of lower back, subsequent encounter: Secondary | ICD-10-CM | POA: Diagnosis not present

## 2022-06-26 DIAGNOSIS — M7062 Trochanteric bursitis, left hip: Secondary | ICD-10-CM | POA: Diagnosis not present

## 2022-06-26 DIAGNOSIS — R252 Cramp and spasm: Secondary | ICD-10-CM | POA: Diagnosis not present

## 2022-07-01 DIAGNOSIS — R252 Cramp and spasm: Secondary | ICD-10-CM | POA: Diagnosis not present

## 2022-07-02 DIAGNOSIS — R2681 Unsteadiness on feet: Secondary | ICD-10-CM | POA: Diagnosis not present

## 2022-07-02 DIAGNOSIS — M6281 Muscle weakness (generalized): Secondary | ICD-10-CM | POA: Diagnosis not present

## 2022-07-02 DIAGNOSIS — S39012D Strain of muscle, fascia and tendon of lower back, subsequent encounter: Secondary | ICD-10-CM | POA: Diagnosis not present

## 2022-07-09 DIAGNOSIS — M79641 Pain in right hand: Secondary | ICD-10-CM | POA: Diagnosis not present

## 2022-07-09 DIAGNOSIS — M79642 Pain in left hand: Secondary | ICD-10-CM | POA: Diagnosis not present

## 2022-07-09 DIAGNOSIS — M1990 Unspecified osteoarthritis, unspecified site: Secondary | ICD-10-CM | POA: Diagnosis not present

## 2022-07-09 DIAGNOSIS — M7062 Trochanteric bursitis, left hip: Secondary | ICD-10-CM | POA: Diagnosis not present

## 2022-07-09 DIAGNOSIS — M359 Systemic involvement of connective tissue, unspecified: Secondary | ICD-10-CM | POA: Diagnosis not present

## 2022-07-09 DIAGNOSIS — M791 Myalgia, unspecified site: Secondary | ICD-10-CM | POA: Diagnosis not present

## 2022-07-09 DIAGNOSIS — E039 Hypothyroidism, unspecified: Secondary | ICD-10-CM | POA: Diagnosis not present

## 2022-07-23 ENCOUNTER — Telehealth: Payer: Self-pay | Admitting: Physical Medicine and Rehabilitation

## 2022-07-23 NOTE — Telephone Encounter (Signed)
Patient called needing to schedule a follow up appointment with Dr. Alvester Morin  Patient said she is in a little pain and wanted to catch it before it got worse. The number to contact patient is    [phone]

## 2022-07-24 NOTE — Telephone Encounter (Signed)
LVM to return call to schedule OV with [facility]

## 2022-07-25 DIAGNOSIS — H40013 Open angle with borderline findings, low risk, bilateral: Secondary | ICD-10-CM | POA: Diagnosis not present

## 2022-07-25 DIAGNOSIS — H524 Presbyopia: Secondary | ICD-10-CM | POA: Diagnosis not present

## 2022-07-25 DIAGNOSIS — H21231 Degeneration of iris (pigmentary), right eye: Secondary | ICD-10-CM | POA: Diagnosis not present

## 2022-07-25 DIAGNOSIS — H16223 Keratoconjunctivitis sicca, not specified as Sjogren's, bilateral: Secondary | ICD-10-CM | POA: Diagnosis not present

## 2022-07-25 DIAGNOSIS — H04123 Dry eye syndrome of bilateral lacrimal glands: Secondary | ICD-10-CM | POA: Diagnosis not present

## 2022-08-04 DIAGNOSIS — D692 Other nonthrombocytopenic purpura: Secondary | ICD-10-CM | POA: Diagnosis not present

## 2022-08-16 IMAGING — MG MM DIGITAL SCREENING BILAT W/ TOMO AND CAD
8 series · 8 of 24 positions shown · non-contrast
Comparison: Previous exam(s).

CLINICAL DATA: Screening.

EXAM:
DIGITAL SCREENING BILATERAL MAMMOGRAM WITH TOMOSYNTHESIS AND CAD
TECHNIQUE: Bilateral screening digital craniocaudal and mediolateral oblique
mammograms were obtained. Bilateral screening digital breast
tomosynthesis was performed. The images were evaluated with
computer-aided detection.

[L MLO synth-2D]
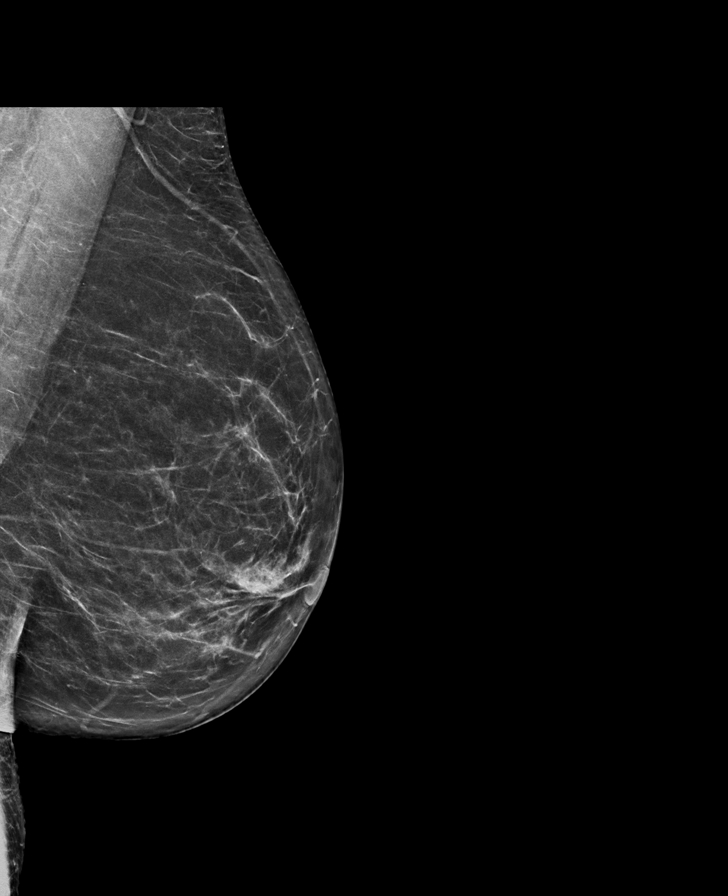

[L CC synth-2D]
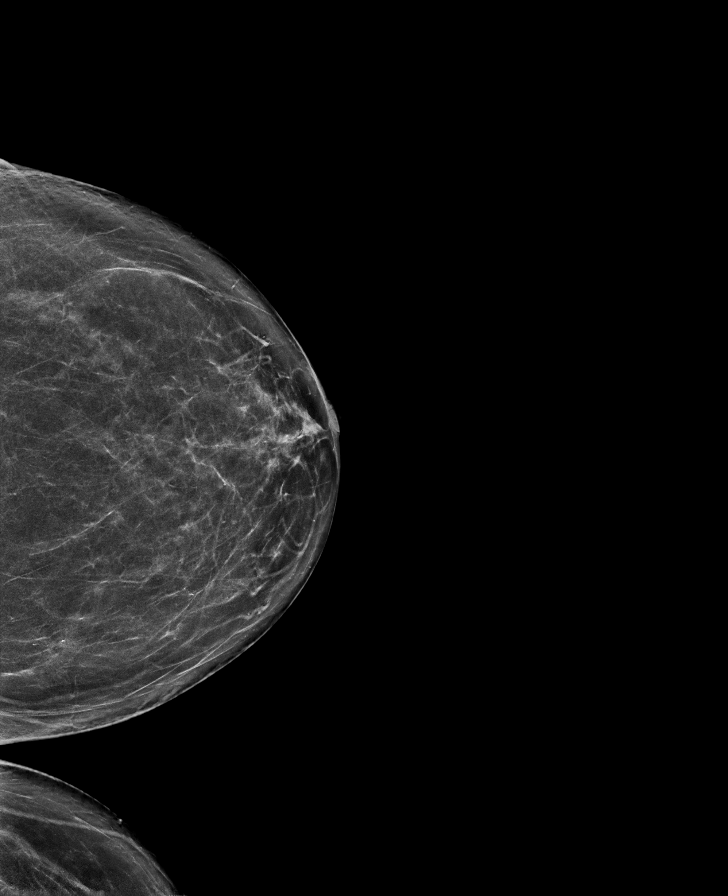

[R CC synth-2D]
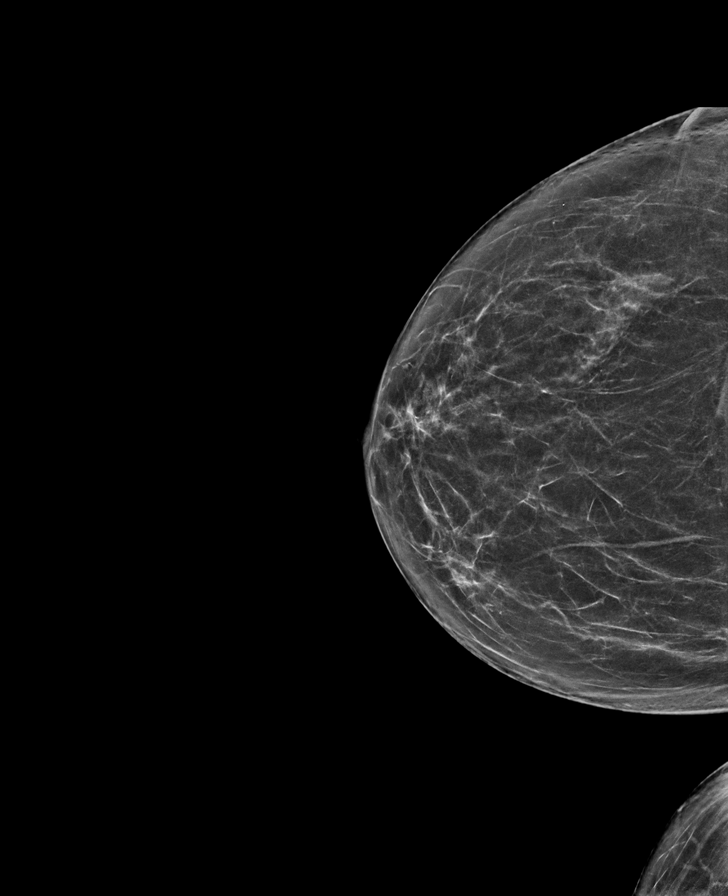

[R MLO synth-2D]
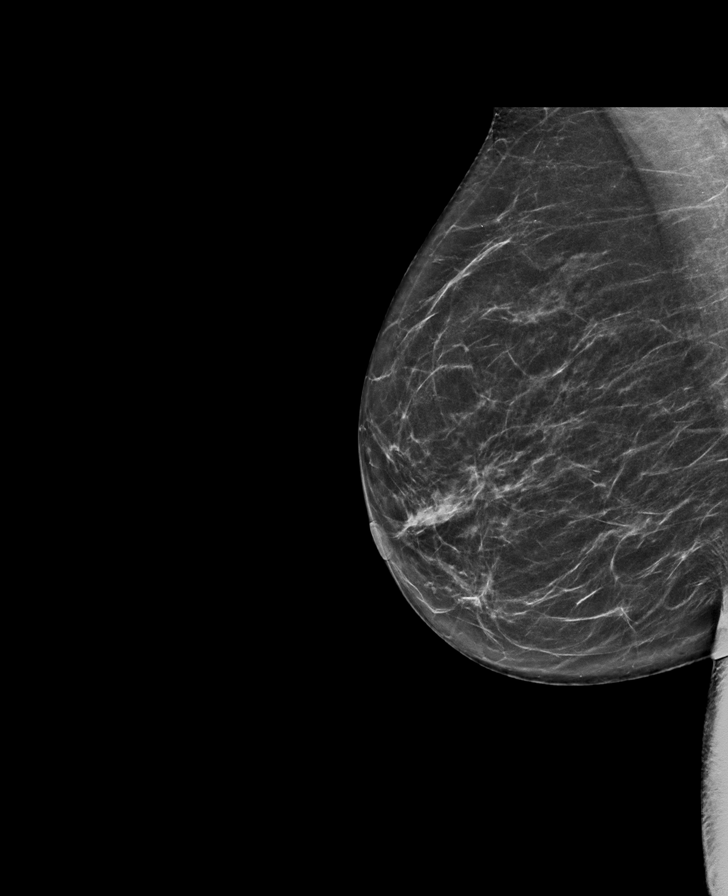

[L CC tomo · tomo slice 35/68.0]
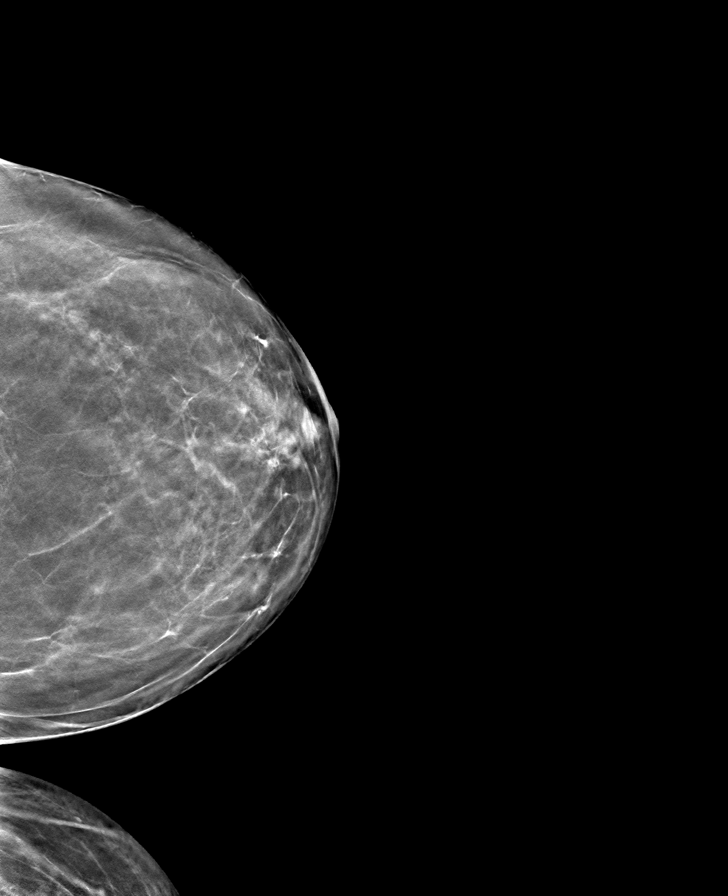

[R CC tomo · tomo slice 33/65.0]
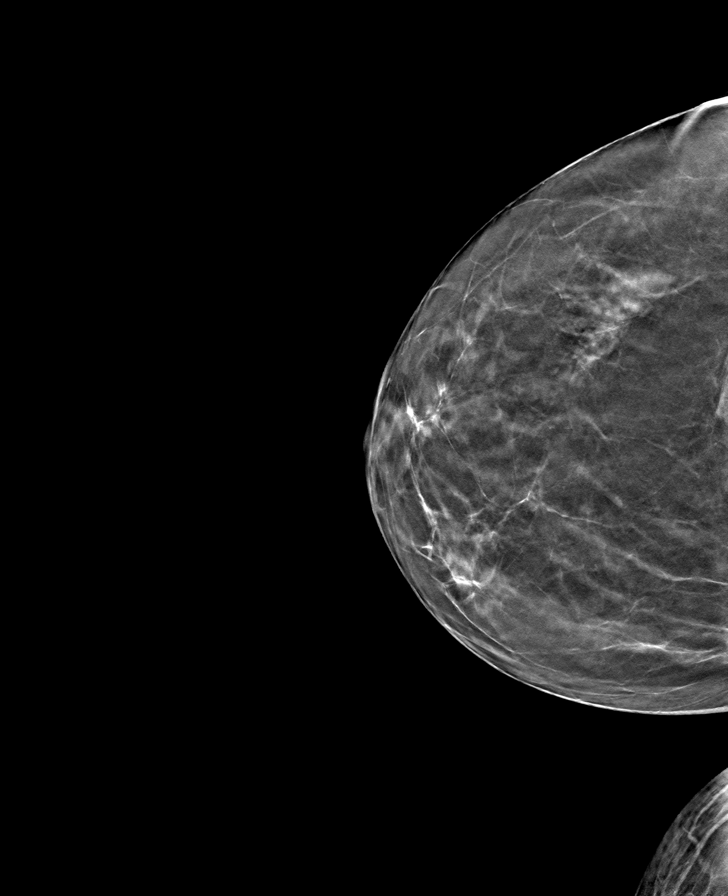

[R MLO tomo · tomo slice 33/65.0]
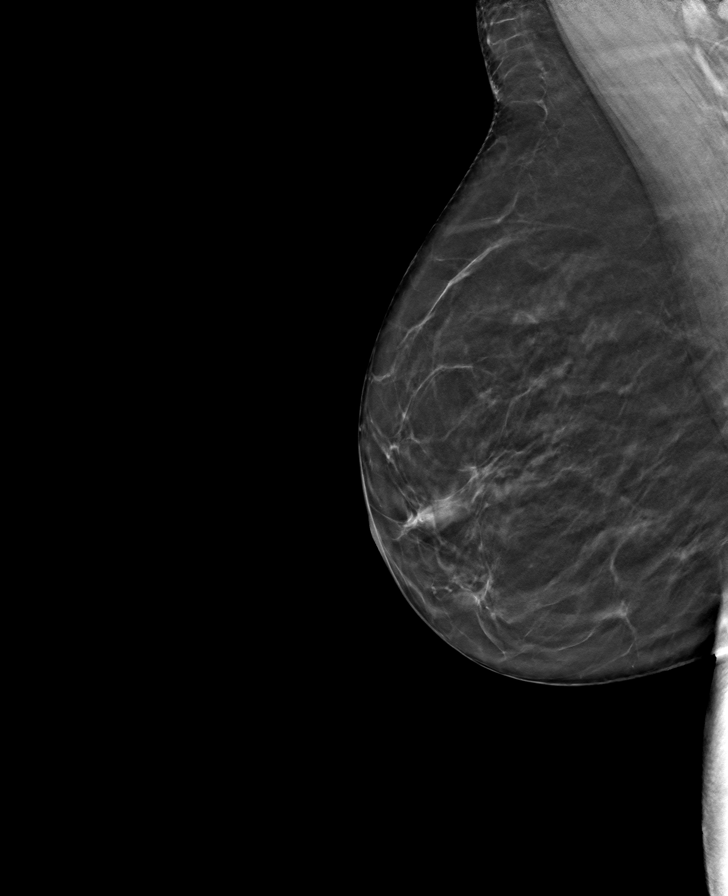

[L MLO tomo · tomo slice 35/68.0]
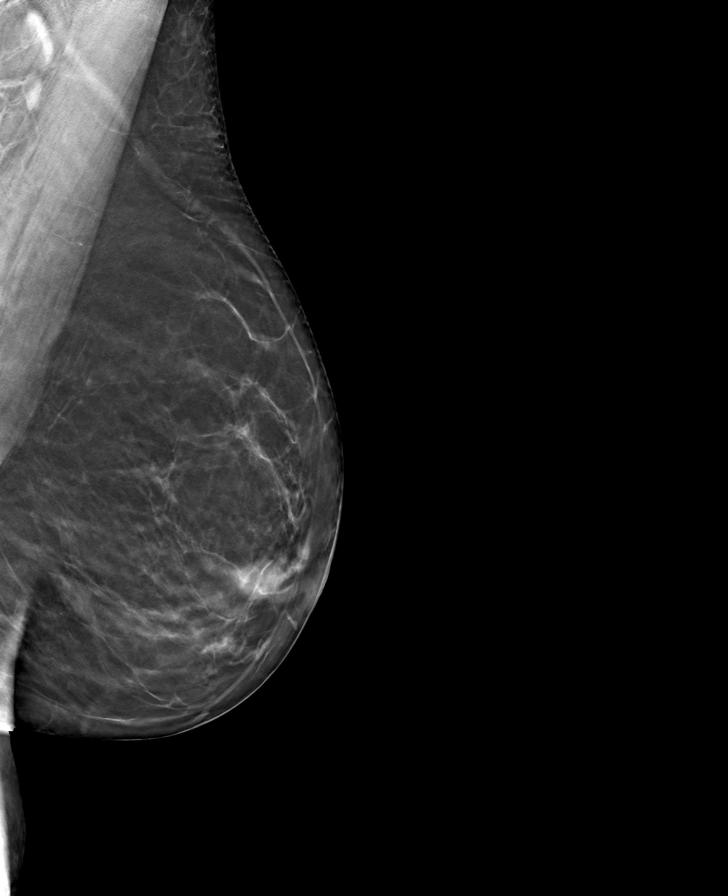

[8 of 24 positions shown; findings below may reference images not displayed]

ACR Breast Density Category b: There are scattered areas of
fibroglandular density.
FINDINGS: There are no findings suspicious for malignancy.
IMPRESSION: No mammographic evidence of malignancy. A result letter of this
screening mammogram will be mailed directly to the patient.

RECOMMENDATION:
Screening mammogram in one year. (Code:51-O-LD2)

BI-RADS CATEGORY  1: Negative.

## 2022-08-27 DIAGNOSIS — F4323 Adjustment disorder with mixed anxiety and depressed mood: Secondary | ICD-10-CM | POA: Diagnosis not present

## 2022-09-02 DIAGNOSIS — M5412 Radiculopathy, cervical region: Secondary | ICD-10-CM | POA: Diagnosis not present

## 2022-09-02 DIAGNOSIS — M4316 Spondylolisthesis, lumbar region: Secondary | ICD-10-CM | POA: Diagnosis not present

## 2022-09-04 NOTE — Progress Notes (Deleted)
[facility] Cancer Initial Visit:  Patient Care Team: Georgianne Fick, MD as PCP - General (Internal Medicine)  CHIEF COMPLAINTS/PURPOSE OF CONSULTATION:  Oncology History   No history exists.    HISTORY OF PRESENTING ILLNESS: Amanda Kim 77 y.o. female is here because of  ***  Review of Systems - Oncology  MEDICAL HISTORY: Past Medical History:  Diagnosis Date   Back pain    d/t horse fell on pt in the 80's   Bone spur    in neck(since 1990) and 1st 3 fingers on right hand go numb   Chronic pain    d/t horse accident in the 80's   Headache(784.0)    tension   History of colon polyps    Hyperlipidemia    takes Fish Oil and CoQ10   Hypertension    takes Inderal daily   Hypothyroid 05/07/2011   takes Synthroid daily   Leg cramps    takes Potassium prn   Liver disease    nonalcholic fatty liver disease   Nausea & vomiting    phenergan and zofran prn   Spondylolisthesis    Spondylosis of thoracolumbar region without myelopathy or radiculopathy    spine   Urinary frequency     SURGICAL HISTORY: Past Surgical History:  Procedure Laterality Date   CESAREAN SECTION  1967, 1970   CHOLECYSTECTOMY N/A 05/20/2012   Procedure: LAPAROSCOPIC CHOLECYSTECTOMY WITH INTRAOPERATIVE CHOLANGIOGRAM;  Surgeon: Robyne Askew, MD;  Location: [facility] OR;  Service: General;  Laterality: N/A;   COLONOSCOPY     TUBAL LIGATION  1981    SOCIAL HISTORY: Social History   Socioeconomic History   Marital status: Single    Spouse name: Not on file   Number of children: 2   Years of education: 1 yr colle   Highest education level: Not on file  Occupational History   Occupation: retired-HR   Occupation: small business owner-consulting  Tobacco Use   Smoking status: Former    Current packs/day: 0.80    Average packs/day: 0.8 packs/day for 45.0 years (36.0 ttl pk-yrs)    Types: Cigarettes   Smokeless tobacco: Never   Tobacco comments:    Quit smoking on  02/2014  Substance and Sexual Activity   Alcohol use: Yes    Alcohol/week: 0.0 standard drinks of alcohol    Comment: red wine couple of times a week   Drug use: No   Sexual activity: Yes    Birth control/protection: Post-menopausal  Other Topics Concern   Not on file  Social History Narrative   09/07/12- Pt reports going to an internalist in [place] for chronic disease management.  Educated pt that family doctor and internalist are the same specialty and it would be better if only one doctor was managing her chronic disease.  Pt said she would like to stay with both and use [place] for acute visits.          Health Care POA: Sister, Sigurd Sos   Emergency Contact: Roselee Culver, [phone]   End of Life Plan:    Who lives with you: self   Any pets: none   Diet: Pt has a varied diet of protein, starch and vegetables.  Pt is currently on lower carb diet for weight loss.   Exercise: Pt exercises with friend 5-6x a week.   Seatbelts: Pt reports wearing seatbelt when in vehicles.    Wynelle Link Exposure/Protection: Pt reports using sunscreen daily.   Hobbies: reading, gardening, decorating, art,  walking, movies                     Social Determinants of Health   Financial Resource Strain: Not on file  Food Insecurity: Not on file  Transportation Needs: Not on file  Physical Activity: Not on file  Stress: Not on file  Social Connections: Unknown (07/01/2021)   Received from [facility]   Social Network    Social Network: Not on file  Intimate Partner Violence: Unknown (05/23/2021)   Received from [facility]   HITS    Physically Hurt: Not on file    Insult or Talk Down To: Not on file    Threaten Physical Harm: Not on file    Scream or Curse: Not on file    FAMILY HISTORY Family History  Problem Relation Age of Onset   Hypertension Mother    Alcohol abuse Mother    Depression Father    Bipolar disorder Father    Colon cancer Paternal Grandmother    Cancer  Paternal Grandmother        colon   Heart attack Paternal Grandfather    Heart disease Paternal Uncle    Hyperlipidemia Paternal Uncle     ALLERGIES:  is allergic to sulfisoxazole, bee venom, and penicillins.  MEDICATIONS:  Current Outpatient Medications  Medication Sig Dispense Refill   calcium citrate-vitamin D (CITRACAL+D) 315-200 MG-UNIT tablet Take by mouth.     cephALEXin (KEFLEX) 500 MG capsule Take 1 capsule (500 mg total) by mouth 2 (two) times daily. 14 capsule 0   Cholecalciferol (VITAMIN D3) 1.25 MG (50000 UT) CAPS Take 1 capsule by mouth once a week.     cyclobenzaprine (FLEXERIL) 10 MG tablet Take 1 tablet (10 mg total) by mouth 2 (two) times daily as needed for muscle spasms. 10 tablet 0   diazepam (VALIUM) 5 MG tablet Take 1 by mouth 1 hour  pre-procedure with very light food. May bring 2nd tablet to appointment. 2 tablet 0   diclofenac (CATAFLAM) 50 MG tablet Take 50 mg by mouth 2 (two) times daily.     Estradiol (VAGIFEM) 10 MCG TABS vaginal tablet Place 1 tablet vaginally 2 (two) times a week.     levothyroxine (SYNTHROID, LEVOTHROID) 100 MCG tablet Take 100 mcg by mouth every morning.  0   losartan (COZAAR) 100 MG tablet Take 100 mg by mouth daily.     nitrofurantoin, macrocrystal-monohydrate, (MACROBID) 100 MG capsule Take 1 capsule (100 mg total) by mouth 2 (two) times daily. 10 capsule 0   nitroGLYCERIN (NITROSTAT) 0.4 MG SL tablet SMARTSIG:1 Tablet(s) Sublingual PRN     Nutritional Supplements (DHEA PO) Take 1 tablet by mouth daily.     oxyCODONE-acetaminophen (PERCOCET/ROXICET) 5-325 MG tablet Take 1 tablet by mouth daily.     pantoprazole (PROTONIX) 40 MG tablet Take 40 mg by mouth daily.     propranolol (INDERAL) 10 MG tablet Take 1 tablet (10 mg total) by mouth 2 (two) times daily. 180 tablet 3   TURMERIC PO Take 1 capsule by mouth 2 (two) times daily.     valACYclovir (VALTREX) 1000 MG tablet TK 1 T PO BID  2   No current facility-administered medications  for this visit.    PHYSICAL EXAMINATION:  ECOG PERFORMANCE STATUS: {CHL ONC ECOG PS:[phone]}   There were no vitals filed for this visit.  There were no vitals filed for this visit.   Physical Exam   LABORATORY DATA: I have personally reviewed the  data as listed:  No visits with results within 1 Month(s) from this visit.  Latest known visit with results is:  Admission on 10/05/2020, Discharged on 10/05/2020  Component Date Value Ref Range Status   Glucose, UA 10/05/2020 NEGATIVE  NEGATIVE mg/dL Final   Bilirubin Urine 10/05/2020 NEGATIVE  NEGATIVE Final   Ketones, ur 10/05/2020 NEGATIVE  NEGATIVE mg/dL Final   Specific Gravity, Urine 10/05/2020 1.010  1.005 - 1.030 Final   Hgb urine dipstick 10/05/2020 LARGE (A)  NEGATIVE Final   pH 10/05/2020 6.0  5.0 - 8.0 Final   Protein, ur 10/05/2020 NEGATIVE  NEGATIVE mg/dL Final   Urobilinogen, UA 10/05/2020 0.2  0.0 - 1.0 mg/dL Final   Nitrite 32/95/1884 NEGATIVE  NEGATIVE Final   Leukocytes,Ua 10/05/2020 MODERATE (A)  NEGATIVE Final   Biochemical Testing Only. Please order routine urinalysis from main lab if confirmatory testing is needed.   Specimen Description 10/05/2020 URINE, CLEAN CATCH   Final   Special Requests 10/05/2020    Final                   Value:NONE Performed at [facility], [address], [place], [place] [address]    Culture 10/05/2020 >=100,000 COLONIES/mL ESCHERICHIA COLI (A)   Final   Report Status 10/05/2020 10/08/2020 FINAL   Final   Organism ID, Bacteria 10/05/2020 ESCHERICHIA COLI (A)   Final    RADIOGRAPHIC STUDIES: I have personally reviewed the radiological images as listed and agree with the findings in the report  No results found.  ASSESSMENT/PLAN Cancer Staging  No matching staging information was found for the patient.    No problem-specific Assessment & Plan notes found for this encounter.    No orders of the defined types were placed in this encounter.     minutes  was spent in patient care.  This included time spent preparing to see the patient (e.g., review of tests), obtaining and/or reviewing separately obtained history, counseling and educating the patient/family/caregiver, ordering medications, tests, or procedures; documenting clinical information in the electronic or other health record, independently interpreting results and communicating results to the patient/family/caregiver as well as coordination of care.       All questions were answered. The patient knows to call the clinic with any problems, questions or concerns.  This note was electronically signed.    Loni Muse, MD  09/04/2022 2:34 PM

## 2022-09-05 ENCOUNTER — Inpatient Hospital Stay: Payer: PPO

## 2022-09-05 ENCOUNTER — Inpatient Hospital Stay: Payer: PPO | Attending: Oncology | Admitting: Oncology

## 2022-09-08 ENCOUNTER — Telehealth: Payer: Self-pay | Admitting: Oncology

## 2022-09-08 NOTE — Telephone Encounter (Signed)
Left patient a vm regarding upcoming appointment change  

## 2022-09-15 DIAGNOSIS — F4323 Adjustment disorder with mixed anxiety and depressed mood: Secondary | ICD-10-CM | POA: Diagnosis not present

## 2022-09-18 DIAGNOSIS — M5416 Radiculopathy, lumbar region: Secondary | ICD-10-CM | POA: Diagnosis not present

## 2022-10-09 NOTE — Progress Notes (Signed)
[facility] Cancer Initial Visit:  Patient Care Team: Georgianne Fick, MD as PCP - General (Internal Medicine)  CHIEF COMPLAINTS/PURPOSE OF CONSULTATION:  HISTORY OF PRESENTING ILLNESS: Amanda Kim 77 y.o. female is here because of  increased bruising  Medical history notable for chronic back pain, headaches, colon polyps, hyperlipidemia, hypertension, NASH, hypothyroidism  June 01, 2022: Chemistries notable for albumin 3.5 Jul 09, 2022: Rheumatoid factor negative.  Anti-CCP negative.  Sed rate 6.  CRP 1.  ANA negative  October 10 2022:  [facility] Hematology Consult  Patient states that she has been experiencing bruising on dorsum of arms and extensor surfaces of legs.  Describes lesions as looking like broken blood vessels.  She also had a lot of bruising on her right tibial region after placement of an orthopedic boot for management of an ankle fracture.  Spends a lot of time outside.   No history blood transfusion, nor history of heavy menstrual bleeding.  No bleeding issues following C section x 2.   Takes black seed oil (increased risk of bleeding),  vitamin k, green lipped muscles, tumeric, diclifenac.    Social:  Divorced.  Counsellor.  Began smoking at 77 years of age and has quit intermittently.  Smokes about 14 cigarettes a day.  EtOH a glass or two of wine a week  [facility] Mother died 37 EtOH  Father died 19 PE Brother alive 56 COPD  Brother alive 94 well Sister alive 74 spine and neck surgery Sister died 41 suicide    Review of Systems  Constitutional:  Negative for appetite change, chills, fatigue, fever and unexpected weight change.  HENT:   Negative for mouth sores, nosebleeds, sore throat and voice change.   Eyes:  Negative for eye problems and icterus.       Vision changes:  None  Respiratory:  Negative for chest tightness, hemoptysis, shortness of breath and wheezing.        Occasional cough with some sputum production   Cardiovascular:  Negative for chest pain.       Occasional mild palpitations.  Occasional mild pedal edema at end of the day  Gastrointestinal:  Negative for abdominal pain, blood in stool, constipation, diarrhea, nausea and vomiting.       Esophageal spasm about once a year relieved by NTG  Endocrine: Negative for hot flashes.       Cold intolerance:  none Heat intolerance:  none  Genitourinary:  Negative for bladder incontinence, difficulty urinating, dysuria, frequency and nocturia.        Occasional mild hematuria which has been evaluated  Musculoskeletal:  Positive for arthralgias, neck pain and neck stiffness. Negative for back pain, gait problem and myalgias.  Skin:  Negative for itching, rash and wound.  Neurological:  Negative for dizziness, extremity weakness, gait problem, headaches, light-headedness, numbness, seizures and speech difficulty.  Hematological:  Negative for adenopathy. Bruises/bleeds easily.  Psychiatric/Behavioral:  Negative for suicidal ideas. The patient is not nervous/anxious.     MEDICAL HISTORY: Past Medical History:  Diagnosis Date   Back pain    d/t horse fell on pt in the 80's   Bone spur    in neck(since 1990) and 1st 3 fingers on right hand go numb   Chronic pain    d/t horse accident in the 80's   Headache(784.0)    tension   History of colon polyps    Hyperlipidemia    takes Fish Oil and CoQ10   Hypertension  takes Inderal daily   Hypothyroid 05/07/2011   takes Synthroid daily   Leg cramps    takes Potassium prn   Liver disease    nonalcholic fatty liver disease   Nausea & vomiting    phenergan and zofran prn   Spondylolisthesis    Spondylosis of thoracolumbar region without myelopathy or radiculopathy    spine   Urinary frequency     SURGICAL HISTORY: Past Surgical History:  Procedure Laterality Date   CESAREAN SECTION  1967, 1970   CHOLECYSTECTOMY N/A 05/20/2012   Procedure: LAPAROSCOPIC CHOLECYSTECTOMY WITH INTRAOPERATIVE  CHOLANGIOGRAM;  Surgeon: Robyne Askew, MD;  Location: [facility] OR;  Service: General;  Laterality: N/A;   COLONOSCOPY     TUBAL LIGATION  1981    SOCIAL HISTORY: Social History   Socioeconomic History   Marital status: Single    Spouse name: Not on file   Number of children: 2   Years of education: 1 yr colle   Highest education level: Not on file  Occupational History   Occupation: retired-HR   Occupation: small business owner-consulting  Tobacco Use   Smoking status: Former    Current packs/day: 0.80    Average packs/day: 0.8 packs/day for 45.0 years (36.0 ttl pk-yrs)    Types: Cigarettes   Smokeless tobacco: Never   Tobacco comments:    Quit smoking on 02/2014  Substance and Sexual Activity   Alcohol use: Yes    Alcohol/week: 0.0 standard drinks of alcohol    Comment: red wine couple of times a week   Drug use: No   Sexual activity: Yes    Birth control/protection: Post-menopausal  Other Topics Concern   Not on file  Social History Narrative   09/07/12- Pt reports going to an internalist in [place] for chronic disease management.  Educated pt that family doctor and internalist are the same specialty and it would be better if only one doctor was managing her chronic disease.  Pt said she would like to stay with both and use [place] for acute visits.          Health Care POA: Sister, Sigurd Sos   Emergency Contact: Roselee Culver, [phone]   End of Life Plan:    Who lives with you: self   Any pets: none   Diet: Pt has a varied diet of protein, starch and vegetables.  Pt is currently on lower carb diet for weight loss.   Exercise: Pt exercises with friend 5-6x a week.   Seatbelts: Pt reports wearing seatbelt when in vehicles.    Wynelle Link Exposure/Protection: Pt reports using sunscreen daily.   Hobbies: reading, gardening, decorating, art, walking, movies                     Social Determinants of Health   Financial Resource Strain: Not on file  Food  Insecurity: Not on file  Transportation Needs: Not on file  Physical Activity: Not on file  Stress: Not on file  Social Connections: Unknown (07/01/2021)   Received from [facility]   Social Network    Social Network: Not on file  Intimate Partner Violence: Unknown (05/23/2021)   [facility], [facility]   HITS    Physically Hurt: Not on file    Insult or Talk Down To: Not on file    Threaten Physical Harm: Not on file    Scream or Curse: Not on file    FAMILY HISTORY Family History  Problem Relation Age of Onset   Hypertension Mother    Alcohol abuse Mother    Depression Father    Bipolar disorder Father    Colon cancer Paternal Grandmother    Cancer Paternal Grandmother        colon   Heart attack Paternal Grandfather    Heart disease Paternal Uncle    Hyperlipidemia Paternal Uncle     ALLERGIES:  is allergic to sulfisoxazole, bee venom, and penicillins.  MEDICATIONS:  Current Outpatient Medications  Medication Sig Dispense Refill   amLODipine (NORVASC) 10 MG tablet Take 10 mg by mouth daily.     Ascorbic Acid (VITAMIN C) 1000 MG tablet Take 1,000 mg by mouth daily.     atorvastatin (LIPITOR) 10 MG tablet Take 10 mg by mouth daily.     BLACK CURRANT SEED OIL PO Take by mouth daily.     Calcium Citrate-Vitamin D (CALCIUM CITRATE+D3 PO) Take by mouth 2 (two) times daily.     co-enzyme Q-10 30 MG capsule Take 30 mg by mouth daily.     COLLAGEN PO Take by mouth.     COLOSTRUM PO Take by mouth daily.     DULoxetine (CYMBALTA) 20 MG capsule Take 20 mg by mouth daily.     Estradiol (VAGIFEM) 10 MCG TABS vaginal tablet Place 10 mcg vaginally 2 (two) times a week.     gabapentin (NEURONTIN) 100 MG capsule Take 200 mg by mouth 2 (two) times daily.     OVER THE COUNTER MEDICATION [address], [place], [place] Tail Mushrooms     OVER THE COUNTER MEDICATION Take 750 mg by mouth daily. Green Lipped Mussels     Sodium Hyaluronate, oral, (HYALURONIC  ACID PO) Take by mouth.     VITAMIN K PO Take by mouth daily.     diclofenac (CATAFLAM) 50 MG tablet Take 50 mg by mouth 2 (two) times daily.     levothyroxine (SYNTHROID, LEVOTHROID) 100 MCG tablet Take 100 mcg by mouth every morning.  0   losartan (COZAAR) 100 MG tablet Take 100 mg by mouth daily.     nitroGLYCERIN (NITROSTAT) 0.4 MG SL tablet SMARTSIG:1 Tablet(s) Sublingual PRN     TURMERIC PO Take 1 capsule by mouth 2 (two) times daily.     valACYclovir (VALTREX) 1000 MG tablet TK 1 T PO BID  2   No current facility-administered medications for this visit.    PHYSICAL EXAMINATION:  ECOG PERFORMANCE STATUS: 0 - Asymptomatic   Vitals:   10/10/22 1422  BP: 106/76  Pulse: 69  Resp: 16  Temp: 98.9 F (37.2 C)  SpO2: 99%    Filed Weights   10/10/22 1422  Weight: 130 lb 8 oz (59.2 kg)     Physical Exam Vitals and nursing note reviewed.  Constitutional:      General: She is not in acute distress.    Appearance: Normal appearance. She is normal weight. She is not ill-appearing, toxic-appearing or diaphoretic.  HENT:     Head: Normocephalic and atraumatic.     Right Ear: External ear normal.     Left Ear: External ear normal.     Nose: Nose normal. No congestion or rhinorrhea.  Eyes:     General: No scleral icterus.    Extraocular Movements: Extraocular movements intact.     Conjunctiva/sclera: Conjunctivae normal.     Pupils: Pupils are equal, round, and reactive to light.  Cardiovascular:     Rate and Rhythm: Normal rate.  Heart sounds: No murmur heard.    No friction rub. No gallop.  Pulmonary:     Effort: Pulmonary effort is normal. No respiratory distress.     Breath sounds: Normal breath sounds. No wheezing or rhonchi.  Abdominal:     General: Bowel sounds are normal. There is no distension.     Palpations: Abdomen is soft.     Tenderness: There is no abdominal tenderness. There is no guarding or rebound.  Musculoskeletal:        General: No swelling,  tenderness or deformity.     Cervical back: Normal range of motion and neck supple. No rigidity or tenderness.  Lymphadenopathy:     Head:     Right side of head: No submental, submandibular, tonsillar, preauricular, posterior auricular or occipital adenopathy.     Left side of head: No submental, submandibular, tonsillar, preauricular, posterior auricular or occipital adenopathy.     Cervical: No cervical adenopathy.     Right cervical: No superficial, deep or posterior cervical adenopathy.    Left cervical: No superficial, deep or posterior cervical adenopathy.     Upper Body:     Right upper body: No supraclavicular, axillary, pectoral or epitrochlear adenopathy.     Left upper body: No supraclavicular, axillary, pectoral or epitrochlear adenopathy.  Skin:    General: Skin is warm.     Coloration: Skin is not jaundiced or pale.     Findings: Bruising present. No erythema or rash.     Comments: Fair complexion.  Atrophic changes to skin on extensor surfaces of forearms and hands.  Scattered ecchymoses on forearms and pretibial regions.    Neurological:     General: No focal deficit present.     Mental Status: She is alert and oriented to person, place, and time. Mental status is at baseline.     Cranial Nerves: No cranial nerve deficit.     Motor: No weakness.     Gait: Gait normal.  Psychiatric:        Mood and Affect: Mood normal.        Behavior: Behavior normal.        Thought Content: Thought content normal.        Judgment: Judgment normal.     LABORATORY DATA: I have personally reviewed the data as listed:  Clinical Support on 10/10/2022  Component Date Value Ref Range Status   Prothrombin Time 10/10/2022 13.3  11.4 - 15.2 seconds Final   INR 10/10/2022 1.0  0.8 - 1.2 Final   Comment: (NOTE) INR goal varies based on device and disease states. Performed at [facility], [address], [place], [place] [address]    aPTT 10/10/2022 24  24 - 36  seconds Final   Performed at [facility], [address] [place], [place] [address]   Fibrinogen 10/10/2022 549 (H)  210 - 475 mg/dL Final   Comment: (NOTE) Fibrinogen results may be underestimated in patients receiving thrombolytic therapy. Performed at [facility], [address], [place], [place] [address]    Coagulation Factor VIII 10/10/2022 203 (H)  56 - 140 % Final   Comment: (NOTE) FVIII activity can increase in a variety of clinical situations including normal pregnancy, in samples drawn from patients (particularly children) who are visibly stressed at the time of phlebotomy, as acute phase reactants, or in response to certain drug therapies such as DDAVP.  Persistently elevated FVIII activity is a risk factor for venous thrombosis as  well as recurrence of venous thrombosis.  Risk is graded and increases with the degree of elevation.  Although elevated FVIII activity has been identified to cluster within families, a genetic basis for the elevation has not yet been elucidated (Br J Haematol. 2012; 161:096-045).    Ristocetin Co-factor, Plasma 10/10/2022 201 (H)  50 - 200 % Final   Comment: (NOTE) Performed At: [facility] [address] [place], [place] [id number] Jolene Schimke MD NW:[id number]    Von Willebrand Antigen, Plasma 10/10/2022 259 (H)  50 - 200 % Final   Comment: (NOTE) VWF may elevate in normal pregnancy, in samples drawn from patients (particularly children) who are visibly stressed at the time of phlebotomy, as acute phase reactants, or in response to certain drug therapies such as DDAVP.  These and other situations may increase levels compared to baseline values.  For these reasons, it may be necessary to repeat testing to make a diagnosis of VWD. This test was developed and its performance characteristics determined by [facility]. It has not been cleared or approved by the Food and Drug Administration.     Sodium 10/10/2022 140  135 - 145 mmol/L Final   Potassium 10/10/2022 4.9  3.5 - 5.1 mmol/L Final   Chloride 10/10/2022 104  98 - 111 mmol/L Final   CO2 10/10/2022 31  22 - 32 mmol/L Final   Glucose, Bld 10/10/2022 125 (H)  70 - 99 mg/dL Final   Glucose reference range applies only to samples taken after fasting for at least 8 hours.   BUN 10/10/2022 15  8 - 23 mg/dL Final   Creatinine 57/84/6962 0.73  0.44 - 1.00 mg/dL Final   Calcium 95/28/4132 9.8  8.9 - 10.3 mg/dL Final   Total Protein 44/02/270 7.1  6.5 - 8.1 g/dL Final   Albumin 53/66/4403 4.3  3.5 - 5.0 g/dL Final   AST 47/42/5956 21  15 - 41 U/L Final   ALT 10/10/2022 26  0 - 44 U/L Final   Alkaline Phosphatase 10/10/2022 84  38 - 126 U/L Final   Total Bilirubin 10/10/2022 0.3  0.3 - 1.2 mg/dL Final   GFR, Estimated 10/10/2022 >60  >60 mL/min Final   Comment: (NOTE) Calculated using the CKD-EPI Creatinine Equation (2021)    Anion gap 10/10/2022 5  5 - 15 Final   Performed at [facility], [address], [place], [place] [address]   WBC Count 10/10/2022 12.4 (H)  4.0 - 10.5 K/uL Final   RBC 10/10/2022 4.67  3.87 - 5.11 MIL/uL Final   Hemoglobin 10/10/2022 14.7  12.0 - 15.0 g/dL Final   HCT 43/32/9518 44.6  36.0 - 46.0 % Final   MCV 10/10/2022 95.5  80.0 - 100.0 fL Final   MCH 10/10/2022 31.5  26.0 - 34.0 pg Final   MCHC 10/10/2022 33.0  30.0 - 36.0 g/dL Final   RDW 84/16/6063 12.3  11.5 - 15.5 % Final   Platelet Count 10/10/2022 311  150 - 400 K/uL Final   nRBC 10/10/2022 0.0  0.0 - 0.2 % Final   Neutrophils Relative % 10/10/2022 68  % Final   Neutro Abs 10/10/2022 8.4 (H)  1.7 - 7.7 K/uL Final   Lymphocytes Relative 10/10/2022 21  % Final   Lymphs Abs 10/10/2022 2.6  0.7 - 4.0 K/uL Final   Monocytes Relative 10/10/2022 9  % Final   Monocytes Absolute 10/10/2022 1.1 (H)  0.1 - 1.0 K/uL Final   Eosinophils Relative 10/10/2022 2  %  Final   Eosinophils Absolute 10/10/2022 0.2  0.0 - 0.5 K/uL Final    Basophils Relative 10/10/2022 0  % Final   Basophils Absolute 10/10/2022 0.0  0.0 - 0.1 K/uL Final   Immature Granulocytes 10/10/2022 0  % Final   Abs Immature Granulocytes 10/10/2022 0.05  0.00 - 0.07 K/uL Final   Performed at [facility], [address] [place], [place] [address]   Interpretation 10/10/2022 Note   Final   Comment: (NOTE) ------------------------------- COAGULATION: VON WILLEBRAND FACTOR ASSESSMENT CURRENT RESULTS ASSESSMENT The VWF:Ag is elevated. The VWF:RCo is elevated. The FVIII is elevated. VON WILLEBRAND FACTOR ASSESSMENT CURRENT RESULTS INTERPRETATION - These results are not consistent with a diagnosis of VWD according to the current NHLBI guideline. Persistently elevated FVIII activity is a risk factor for venous thrombosis as well as recurrence of venous thrombosis. Risk is graded and increases with the degree of elevation. Although elevated FVIII activity has been identified to cluster within families, a genetic basis for the elevation has not yet been elucidated (Br J Haematol. 2012; 157(6):653-663). VON WILLEBRAND FACTOR ASSESSMENT - VWF and FVIII may elevate as compared to baseline in samples drawn from patients (particularly children) who are visibly stressed at the time of phlebotomy, as acute phase reactants, or in response to certain drug therapies such as                           desmopressin. Repeat testing may be necessary before excluding a diagnosis of VWD especially if the clinical suspicion is high for an underlying bleeding disorder. The setting for phlebotomy should be as calm as possible and patients should be encouraged to sit quietly prior to the blood draw. VON WILLEBRAND FACTOR ASSESSMENT DEFINITIONS - VWD - von Willebrand disease; VWF - von Willebrand factor; VWF:Ag - VWF antigen; VWF:RCo - VWF ristocetin cofactor activity; FVIII - factor VIII activity. MEDICAL DIRECTOR: For questions regarding  panel interpretation, please contact Lebron Conners, M.D. at [facility] at [phone]. ------------------------------- DISCLAIMER These assessments and interpretations are provided as a convenience in support of the physician-patient relationship and are not intended to replace the physician's clinical judgment. They are derived from national guidelines in addition to other evidence and expert opinion. The clinici                          an should consider this information within the context of clinical opinion and the individual patient. SEE GUIDANCE FOR VON WILLEBRAND FACTOR ASSESSMENT: (1) The National Heart, Lung and Blood Institute. The Diagnosis, Evaluation and Management of von Willebrand Disease. Lafe Garin, MD: [employer] Publication [phone]. 2007. Available at http://kemp.com/. (2) Annie Sable et al. Othella Boyer J Hematol. 2009; 84(6):366-370. (3) Laffan M et al. Haemophilia. 2004;10(3):199-217. (4) Pasi KJ et al. Haemophilia. 2004; 10(3):218-231. Performed At: [facility] [address] [place], [place] [id number] Blanchie Serve MD HQ:[id number]     RADIOGRAPHIC STUDIES: I have personally reviewed the radiological images as listed and agree with the findings in the report  No results found.  ASSESSMENT/PLAN  77 y.o. female is here because of  increased bruising of arms and legs.  Medical history notable for chronic back pain, headaches, colon polyps, hyperlipidemia, hypertension, NASH, hypothyroidism  Increased bruising  Possible causes Platelet disorders Quantitative ITP, TTP, HUS Leukemia, DIC    Qualitative May-Hegglin anomaly, Wiskott-Aldrich syndrome Bernard-Soulier syndrome, Glanzmann thrombasthenia), storage pool disease, von  Willebrand's disease  Aspirin/ NSAIDS SSRI's      Clotting factor disorders Inherited Hemophilia A Hemophilia B von Willebrand Disease   Acquired Factor  inhibitors Vitamin K deficiency Coumadin, Heparin, DOAC DIC Liver disease      Collagen disorders Inherited Marfans syndrome Ehlers Danlos    Acquired Vitamin C deficiency Cushings disease Senile purpura      Vascular disorders Inherited HHT    Vasculitis, Cryoglobin      Trauma Acquired Abuse   Evaluation- Obtain CBC with diff, PT/PTT/Fibrinogen/ von Willebrand panel/ PFA 100.    Patient is taking several medications and supplements which can increase risk of bruising; these are black seed oil, tumeric and diclofenac      Cancer Staging  No matching staging information was found for the patient.    No problem-specific Assessment & Plan notes found for this encounter.    Orders Placed This Encounter  Procedures   CBC with Differential (Cancer Center Only)    Standing Status:   Future    Number of Occurrences:   1    Standing Expiration Date:   10/10/2023   CMP (Cancer Center only)    Standing Status:   Future    Number of Occurrences:   1    Standing Expiration Date:   10/10/2023   Von Willebrand panel    Standing Status:   Future    Number of Occurrences:   1    Standing Expiration Date:   10/10/2023   Fibrinogen    Standing Status:   Future    Number of Occurrences:   1    Standing Expiration Date:   10/10/2023   APTT    Standing Status:   Future    Number of Occurrences:   1    Standing Expiration Date:   10/10/2023   Protime-INR    Standing Status:   Future    Number of Occurrences:   1    Standing Expiration Date:   10/10/2023    61  minutes was spent in patient care.  This included time spent preparing to see the patient (e.g., review of tests), obtaining and/or reviewing separately obtained history, counseling and educating the patient/family/caregiver, ordering medications, tests, or procedures; documenting clinical information in the electronic or other health record, independently interpreting results and communicating results to the  patient/family/caregiver as well as coordination of care.       All questions were answered. The patient knows to call the clinic with any problems, questions or concerns.  This note was electronically signed.    Loni Muse, MD  11/03/2022 12:58 PM

## 2022-10-10 ENCOUNTER — Inpatient Hospital Stay: Payer: PPO | Attending: Oncology | Admitting: Oncology

## 2022-10-10 ENCOUNTER — Inpatient Hospital Stay: Payer: PPO

## 2022-10-10 VITALS — BP 106/76 | HR 69 | Temp 98.9°F | Resp 16 | Ht 63.0 in | Wt 130.5 lb

## 2022-10-10 DIAGNOSIS — Z791 Long term (current) use of non-steroidal anti-inflammatories (NSAID): Secondary | ICD-10-CM

## 2022-10-10 DIAGNOSIS — Z87891 Personal history of nicotine dependence: Secondary | ICD-10-CM | POA: Insufficient documentation

## 2022-10-10 DIAGNOSIS — Z800 Family history of malignant neoplasm of digestive organs: Secondary | ICD-10-CM | POA: Diagnosis not present

## 2022-10-10 DIAGNOSIS — R233 Spontaneous ecchymoses: Secondary | ICD-10-CM | POA: Insufficient documentation

## 2022-10-10 DIAGNOSIS — T50905A Adverse effect of unspecified drugs, medicaments and biological substances, initial encounter: Secondary | ICD-10-CM

## 2022-10-10 DIAGNOSIS — T148XXA Other injury of unspecified body region, initial encounter: Secondary | ICD-10-CM

## 2022-10-10 LAB — CBC WITH DIFFERENTIAL (CANCER CENTER ONLY)
Abs Immature Granulocytes: 0.05 10*3/uL (ref 0.00–0.07)
Basophils Absolute: 0 10*3/uL (ref 0.0–0.1)
Basophils Relative: 0 %
Eosinophils Absolute: 0.2 10*3/uL (ref 0.0–0.5)
Eosinophils Relative: 2 %
HCT: 44.6 % (ref 36.0–46.0)
Hemoglobin: 14.7 g/dL (ref 12.0–15.0)
Immature Granulocytes: 0 %
Lymphocytes Relative: 21 %
Lymphs Abs: 2.6 10*3/uL (ref 0.7–4.0)
MCH: 31.5 pg (ref 26.0–34.0)
MCHC: 33 g/dL (ref 30.0–36.0)
MCV: 95.5 fL (ref 80.0–100.0)
Monocytes Absolute: 1.1 10*3/uL — ABNORMAL HIGH (ref 0.1–1.0)
Monocytes Relative: 9 %
Neutro Abs: 8.4 10*3/uL — ABNORMAL HIGH (ref 1.7–7.7)
Neutrophils Relative %: 68 %
Platelet Count: 311 10*3/uL (ref 150–400)
RBC: 4.67 MIL/uL (ref 3.87–5.11)
RDW: 12.3 % (ref 11.5–15.5)
WBC Count: 12.4 10*3/uL — ABNORMAL HIGH (ref 4.0–10.5)
nRBC: 0 % (ref 0.0–0.2)

## 2022-10-10 LAB — CMP (CANCER CENTER ONLY)
ALT: 26 U/L (ref 0–44)
AST: 21 U/L (ref 15–41)
Albumin: 4.3 g/dL (ref 3.5–5.0)
Alkaline Phosphatase: 84 U/L (ref 38–126)
Anion gap: 5 (ref 5–15)
BUN: 15 mg/dL (ref 8–23)
CO2: 31 mmol/L (ref 22–32)
Calcium: 9.8 mg/dL (ref 8.9–10.3)
Chloride: 104 mmol/L (ref 98–111)
Creatinine: 0.73 mg/dL (ref 0.44–1.00)
GFR, Estimated: >60 mL/min (ref >60)
Glucose, Bld: 125 mg/dL — ABNORMAL HIGH (ref 70–99)
Potassium: 4.9 mmol/L (ref 3.5–5.1)
Sodium: 140 mmol/L (ref 135–145)
Total Bilirubin: 0.3 mg/dL (ref 0.3–1.2)
Total Protein: 7.1 g/dL (ref 6.5–8.1)

## 2022-10-10 LAB — FIBRINOGEN: Fibrinogen: 549 mg/dL — ABNORMAL HIGH (ref 210–475)

## 2022-10-10 LAB — APTT: aPTT: 24 seconds (ref 24–36)

## 2022-10-10 LAB — PROTIME-INR
INR: 1 (ref 0.8–1.2)
Prothrombin Time: 13.3 seconds (ref 11.4–15.2)

## 2022-10-11 LAB — VON WILLEBRAND PANEL
Coagulation Factor VIII: 203 % — ABNORMAL HIGH (ref 56–140)
Ristocetin Co-factor, Plasma: 201 % — ABNORMAL HIGH (ref 50–200)
Von Willebrand Antigen, Plasma: 259 % — ABNORMAL HIGH (ref 50–200)

## 2022-10-11 LAB — COAG STUDIES INTERP REPORT

## 2022-10-13 DIAGNOSIS — F4323 Adjustment disorder with mixed anxiety and depressed mood: Secondary | ICD-10-CM | POA: Diagnosis not present

## 2022-10-16 DIAGNOSIS — F5101 Primary insomnia: Secondary | ICD-10-CM | POA: Diagnosis not present

## 2022-10-16 DIAGNOSIS — M2550 Pain in unspecified joint: Secondary | ICD-10-CM | POA: Diagnosis not present

## 2022-10-21 ENCOUNTER — Other Ambulatory Visit: Payer: Self-pay | Admitting: Medical Genetics

## 2022-10-21 DIAGNOSIS — Z006 Encounter for examination for normal comparison and control in clinical research program: Secondary | ICD-10-CM

## 2022-10-21 DIAGNOSIS — M7062 Trochanteric bursitis, left hip: Secondary | ICD-10-CM | POA: Diagnosis not present

## 2022-10-23 NOTE — Progress Notes (Signed)
[facility] Cancer Initial Visit:  Patient Care Team: Georgianne Fick, MD as PCP - General (Internal Medicine)  CHIEF COMPLAINTS/PURPOSE OF CONSULTATION:  HISTORY OF PRESENTING ILLNESS: Amanda Kim 77 y.o. female is here because of  increased bruising  Medical history notable for chronic back pain, headaches, colon polyps, hyperlipidemia, hypertension, NASH, hypothyroidism  June 01, 2022: Chemistries notable for albumin 3.5 Jul 09, 2022: Rheumatoid factor negative.  Anti-CCP negative.  Sed rate 6.  CRP 1.  ANA negative  October 10 2022:  [facility] Hematology Consult  Patient states that she has been experiencing bruising on dorsum of arms and extensor surfaces of legs.  Describes lesions as looking like broken blood vessels.  She also had a lot of bruising on her right tibial region after placement of an orthopedic boot for management of an ankle fracture.  Spends a lot of time outside.   No history blood transfusion, nor history of heavy menstrual bleeding.  No bleeding issues following C section x 2.   Takes black seed oil (increased risk of bleeding),  vitamin k, green lipped muscles, tumeric, diclifenac.    Social:  Divorced.  Counsellor.  Began smoking at 77 years of age and has quit intermittently.  Smokes about 14 cigarettes a day.  EtOH a glass or two of wine a week  [facility] Mother died 24 EtOH  Father died 26 PE Brother alive 67 COPD  Brother alive 29 well Sister alive 33 spine and neck surgery Sister died 7 suicide  WBC 12.4 hemoglobin 14.7 MCV 96 platelet count 311; 68 segs 21 lymphs 9 monos 2 eos.  ANC 8.4 absolute monocyte count 1.1  Factor VIII 203 von Willebrand factor antigen 259 ristocetin cofactor 203 INR 1.0 PTT 24 fibrinogen 549  October 24 2022: Scheduled follow-up regarding increased bruising Reviewed results of labs with patient.  She has senile purpura.    Review of Systems  Constitutional:  Negative for  appetite change, chills, fatigue, fever and unexpected weight change.  HENT:   Negative for mouth sores, nosebleeds, sore throat and voice change.   Eyes:  Negative for eye problems and icterus.       Vision changes:  None  Respiratory:  Negative for chest tightness, hemoptysis, shortness of breath and wheezing.        Occasional cough with some sputum production  Cardiovascular:  Negative for chest pain.       Occasional mild palpitations.  Occasional mild pedal edema at end of the day  Gastrointestinal:  Negative for abdominal pain, blood in stool, constipation, diarrhea, nausea and vomiting.       Esophageal spasm about once a year relieved by NTG  Endocrine: Negative for hot flashes.       Cold intolerance:  none Heat intolerance:  none  Genitourinary:  Negative for bladder incontinence, difficulty urinating, dysuria, frequency and nocturia.        Occasional mild hematuria which has been evaluated  Musculoskeletal:  Positive for arthralgias, neck pain and neck stiffness. Negative for back pain, gait problem and myalgias.  Skin:  Negative for itching, rash and wound.  Neurological:  Negative for dizziness, extremity weakness, gait problem, headaches, light-headedness, numbness, seizures and speech difficulty.  Hematological:  Negative for adenopathy. Bruises/bleeds easily.  Psychiatric/Behavioral:  Negative for suicidal ideas. The patient is not nervous/anxious.     MEDICAL HISTORY: Past Medical History:  Diagnosis Date   Back pain    d/t horse fell on pt in  the 80's   Bone spur    in neck(since 1990) and 1st 3 fingers on right hand go numb   Chronic pain    d/t horse accident in the 80's   Headache(784.0)    tension   History of colon polyps    Hyperlipidemia    takes Fish Oil and CoQ10   Hypertension    takes Inderal daily   Hypothyroid 05/07/2011   takes Synthroid daily   Leg cramps    takes Potassium prn   Liver disease    nonalcholic fatty liver disease   Nausea &  vomiting    phenergan and zofran prn   Spondylolisthesis    Spondylosis of thoracolumbar region without myelopathy or radiculopathy    spine   Urinary frequency     SURGICAL HISTORY: Past Surgical History:  Procedure Laterality Date   CESAREAN SECTION  1967, 1970   CHOLECYSTECTOMY N/A 05/20/2012   Procedure: LAPAROSCOPIC CHOLECYSTECTOMY WITH INTRAOPERATIVE CHOLANGIOGRAM;  Surgeon: Robyne Askew, MD;  Location: [facility] OR;  Service: General;  Laterality: N/A;   COLONOSCOPY     TUBAL LIGATION  1981    SOCIAL HISTORY: Social History   Socioeconomic History   Marital status: Single    Spouse name: Not on file   Number of children: 2   Years of education: 1 yr colle   Highest education level: Not on file  Occupational History   Occupation: retired-HR   Occupation: small business owner-consulting  Tobacco Use   Smoking status: Former    Current packs/day: 0.80    Average packs/day: 0.8 packs/day for 45.0 years (36.0 ttl pk-yrs)    Types: Cigarettes   Smokeless tobacco: Never   Tobacco comments:    Quit smoking on 02/2014  Substance and Sexual Activity   Alcohol use: Yes    Alcohol/week: 0.0 standard drinks of alcohol    Comment: red wine couple of times a week   Drug use: No   Sexual activity: Yes    Birth control/protection: Post-menopausal  Other Topics Concern   Not on file  Social History Narrative   09/07/12- Pt reports going to an internalist in [place] for chronic disease management.  Educated pt that family doctor and internalist are the same specialty and it would be better if only one doctor was managing her chronic disease.  Pt said she would like to stay with both and use [place] for acute visits.          Health Care POA: Sister, Sigurd Sos   Emergency Contact: Roselee Culver, [phone]   End of Life Plan:    Who lives with you: self   Any pets: none   Diet: Pt has a varied diet of protein, starch and vegetables.  Pt is currently on lower carb diet  for weight loss.   Exercise: Pt exercises with friend 5-6x a week.   Seatbelts: Pt reports wearing seatbelt when in vehicles.    Wynelle Link Exposure/Protection: Pt reports using sunscreen daily.   Hobbies: reading, gardening, decorating, art, walking, movies                     Social Determinants of Health   Financial Resource Strain: Not on file  Food Insecurity: Not on file  Transportation Needs: Not on file  Physical Activity: Not on file  Stress: Not on file  Social Connections: Unknown (07/01/2021)   Received from [facility]   Social Network    Social Network:  Not on file  Intimate Partner Violence: Unknown (05/23/2021)   Received from [facility]   HITS    Physically Hurt: Not on file    Insult or Talk Down To: Not on file    Threaten Physical Harm: Not on file    Scream or Curse: Not on file    FAMILY HISTORY Family History  Problem Relation Age of Onset   Hypertension Mother    Alcohol abuse Mother    Depression Father    Bipolar disorder Father    Colon cancer Paternal Grandmother    Cancer Paternal Grandmother        colon   Heart attack Paternal Grandfather    Heart disease Paternal Uncle    Hyperlipidemia Paternal Uncle     ALLERGIES:  is allergic to sulfisoxazole, bee venom, and penicillins.  MEDICATIONS:  Current Outpatient Medications  Medication Sig Dispense Refill   amLODipine (NORVASC) 10 MG tablet Take 10 mg by mouth daily.     Ascorbic Acid (VITAMIN C) 1000 MG tablet Take 1,000 mg by mouth daily.     atorvastatin (LIPITOR) 10 MG tablet Take 10 mg by mouth daily.     BLACK CURRANT SEED OIL PO Take by mouth daily.     Calcium Citrate-Vitamin D (CALCIUM CITRATE+D3 PO) Take by mouth 2 (two) times daily.     co-enzyme Q-10 30 MG capsule Take 30 mg by mouth daily.     COLLAGEN PO Take by mouth.     COLOSTRUM PO Take by mouth daily.     diclofenac (CATAFLAM) 50 MG tablet Take 50 mg by mouth 2 (two) times daily.      DULoxetine (CYMBALTA) 20 MG capsule Take 20 mg by mouth daily.     Estradiol (VAGIFEM) 10 MCG TABS vaginal tablet Place 10 mcg vaginally 2 (two) times a week.     gabapentin (NEURONTIN) 100 MG capsule Take 200 mg by mouth 2 (two) times daily.     levothyroxine (SYNTHROID, LEVOTHROID) 100 MCG tablet Take 100 mcg by mouth every morning.  0   losartan (COZAAR) 100 MG tablet Take 100 mg by mouth daily.     nitroGLYCERIN (NITROSTAT) 0.4 MG SL tablet SMARTSIG:1 Tablet(s) Sublingual PRN     OVER THE COUNTER MEDICATION [address], [place], [place] Tail Mushrooms     OVER THE COUNTER MEDICATION Take 750 mg by mouth daily. Green Lipped Mussels     Sodium Hyaluronate, oral, (HYALURONIC ACID PO) Take by mouth.     TURMERIC PO Take 1 capsule by mouth 2 (two) times daily.     valACYclovir (VALTREX) 1000 MG tablet TK 1 T PO BID  2   VITAMIN K PO Take by mouth daily.     No current facility-administered medications for this visit.    PHYSICAL EXAMINATION:  ECOG PERFORMANCE STATUS: 0 - Asymptomatic   Vitals:   10/24/22 1332  BP: (!) 120/59  Pulse: 61  Resp: 18  Temp: 98.3 F (36.8 C)  SpO2: 97%    Filed Weights   10/24/22 1332  Weight: 129 lb 1.6 oz (58.6 kg)     Physical Exam Vitals and nursing note reviewed.  Constitutional:      General: She is not in acute distress.    Appearance: Normal appearance. She is normal weight. She is not ill-appearing, toxic-appearing or diaphoretic.  HENT:     Head: Normocephalic and atraumatic.     Right Ear: External ear normal.     Left Ear:  External ear normal.     Nose: Nose normal. No congestion or rhinorrhea.  Eyes:     General: No scleral icterus.    Extraocular Movements: Extraocular movements intact.     Conjunctiva/sclera: Conjunctivae normal.     Pupils: Pupils are equal, round, and reactive to light.  Cardiovascular:     Rate and Rhythm: Normal rate.     Heart sounds: No murmur heard.    No friction rub. No gallop.  Pulmonary:      Effort: Pulmonary effort is normal. No respiratory distress.     Breath sounds: Normal breath sounds. No wheezing or rhonchi.  Abdominal:     General: Bowel sounds are normal. There is no distension.     Palpations: Abdomen is soft.     Tenderness: There is no abdominal tenderness. There is no guarding or rebound.  Musculoskeletal:        General: No swelling, tenderness or deformity.     Cervical back: Normal range of motion and neck supple. No rigidity or tenderness.  Lymphadenopathy:     Head:     Right side of head: No submental, submandibular, tonsillar, preauricular, posterior auricular or occipital adenopathy.     Left side of head: No submental, submandibular, tonsillar, preauricular, posterior auricular or occipital adenopathy.     Cervical: No cervical adenopathy.     Right cervical: No superficial, deep or posterior cervical adenopathy.    Left cervical: No superficial, deep or posterior cervical adenopathy.     Upper Body:     Right upper body: No supraclavicular, axillary, pectoral or epitrochlear adenopathy.     Left upper body: No supraclavicular, axillary, pectoral or epitrochlear adenopathy.  Skin:    General: Skin is warm.     Coloration: Skin is not jaundiced or pale.     Findings: Bruising present. No erythema or rash.     Comments: Fair complexion.  Atrophic changes to skin on extensor surfaces of forearms and hands.  Scattered ecchymoses on forearms and pretibial regions.    Neurological:     General: No focal deficit present.     Mental Status: She is alert and oriented to person, place, and time. Mental status is at baseline.     Cranial Nerves: No cranial nerve deficit.     Motor: No weakness.     Gait: Gait normal.  Psychiatric:        Mood and Affect: Mood normal.        Behavior: Behavior normal.        Thought Content: Thought content normal.        Judgment: Judgment normal.     LABORATORY DATA: I have personally reviewed the data as  listed:  Clinical Support on 10/10/2022  Component Date Value Ref Range Status   Prothrombin Time 10/10/2022 13.3  11.4 - 15.2 seconds Final   INR 10/10/2022 1.0  0.8 - 1.2 Final   Comment: (NOTE) INR goal varies based on device and disease states. Performed at [facility], [address] [place], [place] [address]    aPTT 10/10/2022 24  24 - 36 seconds Final   Performed at [facility], [address], [place], [place] [address]   Fibrinogen 10/10/2022 549 (H)  210 - 475 mg/dL Final   Comment: (NOTE) Fibrinogen results may be underestimated in patients receiving thrombolytic therapy. Performed at [facility], [address] [place], [place] [address]    Coagulation Factor VIII 10/10/2022 203 (  H)  56 - 140 % Final   Comment: (NOTE) FVIII activity can increase in a variety of clinical situations including normal pregnancy, in samples drawn from patients (particularly children) who are visibly stressed at the time of phlebotomy, as acute phase reactants, or in response to certain drug therapies such as DDAVP.  Persistently elevated FVIII activity is a risk factor for venous thrombosis as well as recurrence of venous thrombosis.  Risk is graded and increases with the degree of elevation.  Although elevated FVIII activity has been identified to cluster within families, a genetic basis for the elevation has not yet been elucidated (Br J Haematol. 2012; 621:308-657).    Ristocetin Co-factor, Plasma 10/10/2022 201 (H)  50 - 200 % Final   Comment: (NOTE) Performed At: [facility] [place] [address] [place], [place] [id number] Jolene Schimke MD WU:[id number]    Von Willebrand Antigen, Plasma 10/10/2022 259 (H)  50 - 200 % Final   Comment: (NOTE) VWF may elevate in normal pregnancy, in samples drawn from patients (particularly children) who are visibly stressed at the time of phlebotomy, as acute phase reactants, or in  response to certain drug therapies such as DDAVP.  These and other situations may increase levels compared to baseline values.  For these reasons, it may be necessary to repeat testing to make a diagnosis of VWD. This test was developed and its performance characteristics determined by [facility]. It has not been cleared or approved by the Food and Drug Administration.    Sodium 10/10/2022 140  135 - 145 mmol/L Final   Potassium 10/10/2022 4.9  3.5 - 5.1 mmol/L Final   Chloride 10/10/2022 104  98 - 111 mmol/L Final   CO2 10/10/2022 31  22 - 32 mmol/L Final   Glucose, Bld 10/10/2022 125 (H)  70 - 99 mg/dL Final   Glucose reference range applies only to samples taken after fasting for at least 8 hours.   BUN 10/10/2022 15  8 - 23 mg/dL Final   Creatinine 25/36/6440 0.73  0.44 - 1.00 mg/dL Final   Calcium 34/74/2595 9.8  8.9 - 10.3 mg/dL Final   Total Protein 63/87/5643 7.1  6.5 - 8.1 g/dL Final   Albumin 32/95/1884 4.3  3.5 - 5.0 g/dL Final   AST 16/60/6301 21  15 - 41 U/L Final   ALT 10/10/2022 26  0 - 44 U/L Final   Alkaline Phosphatase 10/10/2022 84  38 - 126 U/L Final   Total Bilirubin 10/10/2022 0.3  0.3 - 1.2 mg/dL Final   GFR, Estimated 10/10/2022 >60  >60 mL/min Final   Comment: (NOTE) Calculated using the CKD-EPI Creatinine Equation (2021)    Anion gap 10/10/2022 5  5 - 15 Final   Performed at [facility], [address], [place], [place] [address]   WBC Count 10/10/2022 12.4 (H)  4.0 - 10.5 K/uL Final   RBC 10/10/2022 4.67  3.87 - 5.11 MIL/uL Final   Hemoglobin 10/10/2022 14.7  12.0 - 15.0 g/dL Final   HCT 32/35/5732 44.6  36.0 - 46.0 % Final   MCV 10/10/2022 95.5  80.0 - 100.0 fL Final   MCH 10/10/2022 31.5  26.0 - 34.0 pg Final   MCHC 10/10/2022 33.0  30.0 - 36.0 g/dL Final   RDW 20/25/4270 12.3  11.5 - 15.5 % Final   Platelet Count 10/10/2022 311  150 - 400 K/uL Final   nRBC 10/10/2022 0.0  0.0 - 0.2 % Final   Neutrophils Relative %  10/10/2022  68  % Final   Neutro Abs 10/10/2022 8.4 (H)  1.7 - 7.7 K/uL Final   Lymphocytes Relative 10/10/2022 21  % Final   Lymphs Abs 10/10/2022 2.6  0.7 - 4.0 K/uL Final   Monocytes Relative 10/10/2022 9  % Final   Monocytes Absolute 10/10/2022 1.1 (H)  0.1 - 1.0 K/uL Final   Eosinophils Relative 10/10/2022 2  % Final   Eosinophils Absolute 10/10/2022 0.2  0.0 - 0.5 K/uL Final   Basophils Relative 10/10/2022 0  % Final   Basophils Absolute 10/10/2022 0.0  0.0 - 0.1 K/uL Final   Immature Granulocytes 10/10/2022 0  % Final   Abs Immature Granulocytes 10/10/2022 0.05  0.00 - 0.07 K/uL Final   Performed at [facility], [address] [place], [place] [address]   Interpretation 10/10/2022 Note   Final   Comment: (NOTE) ------------------------------- COAGULATION: VON WILLEBRAND FACTOR ASSESSMENT CURRENT RESULTS ASSESSMENT The VWF:Ag is elevated. The VWF:RCo is elevated. The FVIII is elevated. VON WILLEBRAND FACTOR ASSESSMENT CURRENT RESULTS INTERPRETATION - These results are not consistent with a diagnosis of VWD according to the current NHLBI guideline. Persistently elevated FVIII activity is a risk factor for venous thrombosis as well as recurrence of venous thrombosis. Risk is graded and increases with the degree of elevation. Although elevated FVIII activity has been identified to cluster within families, a genetic basis for the elevation has not yet been elucidated (Br J Haematol. 2012; 157(6):653-663). VON WILLEBRAND FACTOR ASSESSMENT - VWF and FVIII may elevate as compared to baseline in samples drawn from patients (particularly children) who are visibly stressed at the time of phlebotomy, as acute phase reactants, or in response to certain drug therapies such as                           desmopressin. Repeat testing may be necessary before excluding a diagnosis of VWD especially if the clinical suspicion is high for an underlying bleeding disorder.  The setting for phlebotomy should be as calm as possible and patients should be encouraged to sit quietly prior to the blood draw. VON WILLEBRAND FACTOR ASSESSMENT DEFINITIONS - VWD - von Willebrand disease; VWF - von Willebrand factor; VWF:Ag - VWF antigen; VWF:RCo - VWF ristocetin cofactor activity; FVIII - factor VIII activity. MEDICAL DIRECTOR: For questions regarding panel interpretation, please contact Lebron Conners, M.D. at [facility] at [phone]. ------------------------------- DISCLAIMER These assessments and interpretations are provided as a convenience in support of the physician-patient relationship and are not intended to replace the physician's clinical judgment. They are derived from national guidelines in addition to other evidence and expert opinion. The clinici                          an should consider this information within the context of clinical opinion and the individual patient. SEE GUIDANCE FOR VON WILLEBRAND FACTOR ASSESSMENT: (1) The National Heart, Lung and Blood Institute. The Diagnosis, Evaluation and Management of von Willebrand Disease. Lafe Garin, MD: [employer] Publication [phone]. 2007. Available at http://kemp.com/. (2) Annie Sable et al. Othella Boyer J Hematol. 2009; 84(6):366-370. (3) Laffan M et al. Haemophilia. 2004;10(3):199-217. (4) Pasi KJ et al. Haemophilia. 2004; 10(3):218-231. Performed At: [facility] / Digital [address] [place], [place] [id number] Blanchie Serve MD WF:[id number]     RADIOGRAPHIC STUDIES: I have personally reviewed the radiological images as listed and agree with the findings  in the report  No results found.  ASSESSMENT/PLAN 77 y.o. female is here because of  increased bruising of arms and legs.  Medical history notable for chronic back pain, headaches, colon polyps, hyperlipidemia, hypertension, NASH, hypothyroidism   Increased  bruising  October 10 2022- Patient notes increased bruising on dorsum of arms and extensor surfaces of legs.  No history of sinister bleeding.  No family history of bleeding disorder  WBC 12.4 hemoglobin 14.7 MCV 96 platelet count 311; 68 segs 21 lymphs 9 monos 2 eos.  ANC 8.4 absolute monocyte count 1.1  Factor VIII 203 von Willebrand factor antigen 259 ristocetin cofactor 203 INR 1.0 PTT 24 fibrinogen 549  Patient is taking several medications and supplements which can increase risk of bruising; these are black seed oil, tumeric and diclofenac    October 24 2022:  Etiology is multifactorial 1) senile purpura 2) medication/ supplement effect from black seed oil, tumeric and diclofenac   Senile purpura (AKA- actinic purpura, solar purpura, Bateman purpura): Common, benign disorder of dermal connective tissue occurring due to skin damage from chronic sun exposure.  12% of individuals after the age of 45 years and 30% after the age of 64.  Affects both genders equally.  Characterized by dark purple blotches on photo-exposed areas, esp. back of hands, neck, face and the extensor surfaces of the forearm.   Extravasation of blood into the dermis occurs due to skin atrophy and blood vessel fragility.  Often occurs following minor trauma.  Presents as patches and macules with irregular edges on the forearm and back of the hands.  Other areas (legs, neck, and face) may  be affected.  Lesions may appear as dark purple macules and extensive ecchymosis,  sizes range from 1cm to 4 cm in diameter.  Macules are asymptomatic (not associated with pruritus or tenderness).  Surrounding skin is thin, pigmented and inelastic.  May also be other photo-aging lesions (wrinkling, lentigines, sallow yellow skin hue, and actinic keratosis and stellate pseudo scars). Purpuric lesions persist for 1 to 3 weeks before resolving.  Residual deposit of hemosiderin in the dermis may occur.   Occurs because of the established cutaneous and  vascular fragility.  No risk of serious complications.    Cancer Staging  No matching staging information was found for the patient.    No problem-specific Assessment & Plan notes found for this encounter.    No orders of the defined types were placed in this encounter.   30  minutes was spent in patient care.  This included time spent preparing to see the patient (e.g., review of tests), obtaining and/or reviewing separately obtained history, counseling and educating the patient/family/caregiver, ordering medications, tests, or procedures; documenting clinical information in the electronic or other health record, independently interpreting results and communicating results to the patient/family/caregiver as well as coordination of care.       All questions were answered. The patient knows to call the clinic with any problems, questions or concerns.  This note was electronically signed.    Loni Muse, MD  11/13/2022 2:19 PM

## 2022-10-24 ENCOUNTER — Inpatient Hospital Stay: Payer: PPO | Attending: Oncology | Admitting: Oncology

## 2022-10-24 VITALS — BP 120/59 | HR 61 | Temp 98.3°F | Resp 18 | Ht 63.0 in | Wt 129.1 lb

## 2022-10-24 DIAGNOSIS — Z87891 Personal history of nicotine dependence: Secondary | ICD-10-CM | POA: Diagnosis not present

## 2022-10-24 DIAGNOSIS — D692 Other nonthrombocytopenic purpura: Secondary | ICD-10-CM | POA: Diagnosis not present

## 2022-10-24 DIAGNOSIS — D6800 Von Willebrand disease, unspecified: Secondary | ICD-10-CM | POA: Diagnosis not present

## 2022-10-24 DIAGNOSIS — Z800 Family history of malignant neoplasm of digestive organs: Secondary | ICD-10-CM | POA: Diagnosis not present

## 2022-10-24 DIAGNOSIS — R233 Spontaneous ecchymoses: Secondary | ICD-10-CM

## 2022-10-24 DIAGNOSIS — T50905A Adverse effect of unspecified drugs, medicaments and biological substances, initial encounter: Secondary | ICD-10-CM

## 2022-10-24 DIAGNOSIS — Z791 Long term (current) use of non-steroidal anti-inflammatories (NSAID): Secondary | ICD-10-CM

## 2022-10-27 DIAGNOSIS — F4323 Adjustment disorder with mixed anxiety and depressed mood: Secondary | ICD-10-CM | POA: Diagnosis not present

## 2022-11-03 DIAGNOSIS — Z791 Long term (current) use of non-steroidal anti-inflammatories (NSAID): Secondary | ICD-10-CM | POA: Insufficient documentation

## 2022-11-03 DIAGNOSIS — T50905A Adverse effect of unspecified drugs, medicaments and biological substances, initial encounter: Secondary | ICD-10-CM | POA: Insufficient documentation

## 2022-11-10 DIAGNOSIS — F4323 Adjustment disorder with mixed anxiety and depressed mood: Secondary | ICD-10-CM | POA: Diagnosis not present

## 2022-11-11 DIAGNOSIS — G6281 Critical illness polyneuropathy: Secondary | ICD-10-CM | POA: Diagnosis not present

## 2022-11-11 DIAGNOSIS — M5417 Radiculopathy, lumbosacral region: Secondary | ICD-10-CM | POA: Diagnosis not present

## 2022-11-13 DIAGNOSIS — D692 Other nonthrombocytopenic purpura: Secondary | ICD-10-CM | POA: Insufficient documentation

## 2022-11-17 DIAGNOSIS — G4762 Sleep related leg cramps: Secondary | ICD-10-CM | POA: Diagnosis not present

## 2022-11-17 DIAGNOSIS — R5383 Other fatigue: Secondary | ICD-10-CM | POA: Diagnosis not present

## 2022-11-17 DIAGNOSIS — F411 Generalized anxiety disorder: Secondary | ICD-10-CM | POA: Diagnosis not present

## 2022-11-17 DIAGNOSIS — E039 Hypothyroidism, unspecified: Secondary | ICD-10-CM | POA: Diagnosis not present

## 2022-11-17 DIAGNOSIS — I10 Essential (primary) hypertension: Secondary | ICD-10-CM | POA: Diagnosis not present

## 2022-11-17 DIAGNOSIS — I700 Atherosclerosis of aorta: Secondary | ICD-10-CM | POA: Diagnosis not present

## 2022-11-17 DIAGNOSIS — R7309 Other abnormal glucose: Secondary | ICD-10-CM | POA: Diagnosis not present

## 2022-11-17 DIAGNOSIS — Z23 Encounter for immunization: Secondary | ICD-10-CM | POA: Diagnosis not present

## 2022-11-17 DIAGNOSIS — D692 Other nonthrombocytopenic purpura: Secondary | ICD-10-CM | POA: Diagnosis not present

## 2022-11-17 DIAGNOSIS — E782 Mixed hyperlipidemia: Secondary | ICD-10-CM | POA: Diagnosis not present

## 2022-11-17 DIAGNOSIS — Z0000 Encounter for general adult medical examination without abnormal findings: Secondary | ICD-10-CM | POA: Diagnosis not present

## 2022-11-17 DIAGNOSIS — J432 Centrilobular emphysema: Secondary | ICD-10-CM | POA: Diagnosis not present

## 2022-11-18 DIAGNOSIS — M5416 Radiculopathy, lumbar region: Secondary | ICD-10-CM | POA: Diagnosis not present

## 2022-11-18 DIAGNOSIS — M7062 Trochanteric bursitis, left hip: Secondary | ICD-10-CM | POA: Diagnosis not present

## 2022-11-18 DIAGNOSIS — M79641 Pain in right hand: Secondary | ICD-10-CM | POA: Diagnosis not present

## 2022-11-18 DIAGNOSIS — M6281 Muscle weakness (generalized): Secondary | ICD-10-CM | POA: Diagnosis not present

## 2022-11-19 DIAGNOSIS — M1811 Unilateral primary osteoarthritis of first carpometacarpal joint, right hand: Secondary | ICD-10-CM | POA: Diagnosis not present

## 2022-11-24 DIAGNOSIS — E1169 Type 2 diabetes mellitus with other specified complication: Secondary | ICD-10-CM | POA: Diagnosis not present

## 2022-11-24 DIAGNOSIS — D692 Other nonthrombocytopenic purpura: Secondary | ICD-10-CM | POA: Diagnosis not present

## 2022-11-24 DIAGNOSIS — F4323 Adjustment disorder with mixed anxiety and depressed mood: Secondary | ICD-10-CM | POA: Diagnosis not present

## 2022-11-24 DIAGNOSIS — I10 Essential (primary) hypertension: Secondary | ICD-10-CM | POA: Diagnosis not present

## 2022-11-24 DIAGNOSIS — G4762 Sleep related leg cramps: Secondary | ICD-10-CM | POA: Diagnosis not present

## 2022-11-24 DIAGNOSIS — R6889 Other general symptoms and signs: Secondary | ICD-10-CM | POA: Diagnosis not present

## 2022-11-24 DIAGNOSIS — F411 Generalized anxiety disorder: Secondary | ICD-10-CM | POA: Diagnosis not present

## 2022-11-24 DIAGNOSIS — E782 Mixed hyperlipidemia: Secondary | ICD-10-CM | POA: Diagnosis not present

## 2022-11-24 DIAGNOSIS — Z7189 Other specified counseling: Secondary | ICD-10-CM | POA: Diagnosis not present

## 2022-11-24 DIAGNOSIS — J432 Centrilobular emphysema: Secondary | ICD-10-CM | POA: Diagnosis not present

## 2022-11-24 DIAGNOSIS — E039 Hypothyroidism, unspecified: Secondary | ICD-10-CM | POA: Diagnosis not present

## 2022-11-24 DIAGNOSIS — Z0000 Encounter for general adult medical examination without abnormal findings: Secondary | ICD-10-CM | POA: Diagnosis not present

## 2022-11-24 DIAGNOSIS — I700 Atherosclerosis of aorta: Secondary | ICD-10-CM | POA: Diagnosis not present

## 2022-12-02 DIAGNOSIS — G9331 Postviral fatigue syndrome: Secondary | ICD-10-CM | POA: Diagnosis not present

## 2022-12-02 DIAGNOSIS — R058 Other specified cough: Secondary | ICD-10-CM | POA: Diagnosis not present

## 2022-12-03 ENCOUNTER — Other Ambulatory Visit (HOSPITAL_COMMUNITY)
Admission: RE | Admit: 2022-12-03 | Discharge: 2022-12-03 | Disposition: A | Discharge status: Home / Self Care | Payer: PPO | Source: Ambulatory Visit | Attending: Oncology | Admitting: Oncology

## 2022-12-03 DIAGNOSIS — Z006 Encounter for examination for normal comparison and control in clinical research program: Secondary | ICD-10-CM | POA: Insufficient documentation

## 2022-12-04 DIAGNOSIS — M7062 Trochanteric bursitis, left hip: Secondary | ICD-10-CM | POA: Diagnosis not present

## 2022-12-04 DIAGNOSIS — M4316 Spondylolisthesis, lumbar region: Secondary | ICD-10-CM | POA: Diagnosis not present

## 2022-12-08 DIAGNOSIS — F4323 Adjustment disorder with mixed anxiety and depressed mood: Secondary | ICD-10-CM | POA: Diagnosis not present

## 2022-12-14 LAB — HELIX MOLECULAR SCREEN: Genetic Analysis Overall Interpretation: NEGATIVE

## 2022-12-16 DIAGNOSIS — Z01419 Encounter for gynecological examination (general) (routine) without abnormal findings: Secondary | ICD-10-CM | POA: Diagnosis not present

## 2022-12-16 DIAGNOSIS — Z01411 Encounter for gynecological examination (general) (routine) with abnormal findings: Secondary | ICD-10-CM | POA: Diagnosis not present

## 2022-12-16 DIAGNOSIS — Z124 Encounter for screening for malignant neoplasm of cervix: Secondary | ICD-10-CM | POA: Diagnosis not present

## 2022-12-17 ENCOUNTER — Ambulatory Visit
Admission: RE | Admit: 2022-12-17 | Discharge: 2022-12-17 | Disposition: A | Discharge status: Home / Self Care | Payer: PPO | Source: Ambulatory Visit | Attending: Internal Medicine | Admitting: Internal Medicine

## 2022-12-17 ENCOUNTER — Other Ambulatory Visit: Payer: Self-pay | Admitting: Internal Medicine

## 2022-12-17 DIAGNOSIS — Z1231 Encounter for screening mammogram for malignant neoplasm of breast: Secondary | ICD-10-CM

## 2022-12-18 DIAGNOSIS — G9331 Postviral fatigue syndrome: Secondary | ICD-10-CM | POA: Diagnosis not present

## 2022-12-18 DIAGNOSIS — R058 Other specified cough: Secondary | ICD-10-CM | POA: Diagnosis not present

## 2022-12-19 DIAGNOSIS — M4316 Spondylolisthesis, lumbar region: Secondary | ICD-10-CM | POA: Diagnosis not present

## 2022-12-29 DIAGNOSIS — F4323 Adjustment disorder with mixed anxiety and depressed mood: Secondary | ICD-10-CM | POA: Diagnosis not present

## 2023-01-06 DIAGNOSIS — M7062 Trochanteric bursitis, left hip: Secondary | ICD-10-CM | POA: Diagnosis not present

## 2023-01-12 DIAGNOSIS — F4323 Adjustment disorder with mixed anxiety and depressed mood: Secondary | ICD-10-CM | POA: Diagnosis not present

## 2023-01-26 DIAGNOSIS — F4323 Adjustment disorder with mixed anxiety and depressed mood: Secondary | ICD-10-CM | POA: Diagnosis not present

## 2023-01-27 DIAGNOSIS — H40013 Open angle with borderline findings, low risk, bilateral: Secondary | ICD-10-CM | POA: Diagnosis not present

## 2023-01-28 DIAGNOSIS — E782 Mixed hyperlipidemia: Secondary | ICD-10-CM | POA: Diagnosis not present

## 2023-01-28 DIAGNOSIS — I10 Essential (primary) hypertension: Secondary | ICD-10-CM | POA: Diagnosis not present

## 2023-01-28 DIAGNOSIS — E1169 Type 2 diabetes mellitus with other specified complication: Secondary | ICD-10-CM | POA: Diagnosis not present

## 2023-01-28 DIAGNOSIS — J432 Centrilobular emphysema: Secondary | ICD-10-CM | POA: Diagnosis not present

## 2023-01-28 DIAGNOSIS — F411 Generalized anxiety disorder: Secondary | ICD-10-CM | POA: Diagnosis not present

## 2023-01-28 DIAGNOSIS — E039 Hypothyroidism, unspecified: Secondary | ICD-10-CM | POA: Diagnosis not present

## 2023-01-28 DIAGNOSIS — I700 Atherosclerosis of aorta: Secondary | ICD-10-CM | POA: Diagnosis not present

## 2023-01-28 DIAGNOSIS — N182 Chronic kidney disease, stage 2 (mild): Secondary | ICD-10-CM | POA: Diagnosis not present

## 2023-01-28 DIAGNOSIS — D692 Other nonthrombocytopenic purpura: Secondary | ICD-10-CM | POA: Diagnosis not present

## 2023-02-06 ENCOUNTER — Encounter (HOSPITAL_COMMUNITY): Payer: Self-pay | Admitting: *Deleted

## 2023-02-06 ENCOUNTER — Emergency Department (HOSPITAL_COMMUNITY): Payer: PPO

## 2023-02-06 ENCOUNTER — Telehealth (HOSPITAL_COMMUNITY): Payer: Self-pay

## 2023-02-06 ENCOUNTER — Other Ambulatory Visit (HOSPITAL_COMMUNITY): Payer: Self-pay

## 2023-02-06 ENCOUNTER — Inpatient Hospital Stay (HOSPITAL_COMMUNITY)
Admission: EM | Admit: 2023-02-06 | Discharge: 2023-02-07 | DRG: 322 | Disposition: A | Discharge status: Home / Self Care | Payer: PPO | Attending: Internal Medicine | Admitting: Internal Medicine

## 2023-02-06 ENCOUNTER — Other Ambulatory Visit: Payer: Self-pay

## 2023-02-06 ENCOUNTER — Encounter (HOSPITAL_COMMUNITY)
Admission: EM | Disposition: A | Discharge status: Home / Self Care | Payer: Self-pay | Source: Home / Self Care | Attending: Internal Medicine

## 2023-02-06 DIAGNOSIS — G8929 Other chronic pain: Secondary | ICD-10-CM | POA: Diagnosis present

## 2023-02-06 DIAGNOSIS — Z811 Family history of alcohol abuse and dependence: Secondary | ICD-10-CM | POA: Diagnosis not present

## 2023-02-06 DIAGNOSIS — I10 Essential (primary) hypertension: Secondary | ICD-10-CM | POA: Diagnosis not present

## 2023-02-06 DIAGNOSIS — Z79818 Long term (current) use of other agents affecting estrogen receptors and estrogen levels: Secondary | ICD-10-CM | POA: Diagnosis not present

## 2023-02-06 DIAGNOSIS — K760 Fatty (change of) liver, not elsewhere classified: Secondary | ICD-10-CM | POA: Diagnosis present

## 2023-02-06 DIAGNOSIS — Z818 Family history of other mental and behavioral disorders: Secondary | ICD-10-CM | POA: Diagnosis not present

## 2023-02-06 DIAGNOSIS — F419 Anxiety disorder, unspecified: Secondary | ICD-10-CM | POA: Diagnosis not present

## 2023-02-06 DIAGNOSIS — K224 Dyskinesia of esophagus: Secondary | ICD-10-CM | POA: Diagnosis present

## 2023-02-06 DIAGNOSIS — E7800 Pure hypercholesterolemia, unspecified: Secondary | ICD-10-CM | POA: Diagnosis not present

## 2023-02-06 DIAGNOSIS — Z8249 Family history of ischemic heart disease and other diseases of the circulatory system: Secondary | ICD-10-CM

## 2023-02-06 DIAGNOSIS — Z7902 Long term (current) use of antithrombotics/antiplatelets: Secondary | ICD-10-CM

## 2023-02-06 DIAGNOSIS — Z880 Allergy status to penicillin: Secondary | ICD-10-CM | POA: Diagnosis not present

## 2023-02-06 DIAGNOSIS — Z79899 Other long term (current) drug therapy: Secondary | ICD-10-CM | POA: Diagnosis not present

## 2023-02-06 DIAGNOSIS — R079 Chest pain, unspecified: Secondary | ICD-10-CM | POA: Diagnosis not present

## 2023-02-06 DIAGNOSIS — Z83438 Family history of other disorder of lipoprotein metabolism and other lipidemia: Secondary | ICD-10-CM

## 2023-02-06 DIAGNOSIS — E039 Hypothyroidism, unspecified: Secondary | ICD-10-CM | POA: Diagnosis present

## 2023-02-06 DIAGNOSIS — Z7982 Long term (current) use of aspirin: Secondary | ICD-10-CM | POA: Diagnosis not present

## 2023-02-06 DIAGNOSIS — Z809 Family history of malignant neoplasm, unspecified: Secondary | ICD-10-CM | POA: Diagnosis not present

## 2023-02-06 DIAGNOSIS — I214 Non-ST elevation (NSTEMI) myocardial infarction: Principal | ICD-10-CM

## 2023-02-06 DIAGNOSIS — F17210 Nicotine dependence, cigarettes, uncomplicated: Secondary | ICD-10-CM | POA: Diagnosis not present

## 2023-02-06 DIAGNOSIS — E876 Hypokalemia: Secondary | ICD-10-CM | POA: Diagnosis not present

## 2023-02-06 DIAGNOSIS — Z720 Tobacco use: Secondary | ICD-10-CM | POA: Diagnosis present

## 2023-02-06 DIAGNOSIS — Z882 Allergy status to sulfonamides status: Secondary | ICD-10-CM

## 2023-02-06 DIAGNOSIS — I2510 Atherosclerotic heart disease of native coronary artery without angina pectoris: Secondary | ICD-10-CM | POA: Diagnosis not present

## 2023-02-06 DIAGNOSIS — I25110 Atherosclerotic heart disease of native coronary artery with unstable angina pectoris: Secondary | ICD-10-CM | POA: Diagnosis present

## 2023-02-06 DIAGNOSIS — Z91030 Bee allergy status: Secondary | ICD-10-CM

## 2023-02-06 DIAGNOSIS — Z800 Family history of malignant neoplasm of digestive organs: Secondary | ICD-10-CM

## 2023-02-06 DIAGNOSIS — Z79890 Hormone replacement therapy (postmenopausal): Secondary | ICD-10-CM | POA: Diagnosis not present

## 2023-02-06 DIAGNOSIS — R0789 Other chest pain: Secondary | ICD-10-CM | POA: Diagnosis not present

## 2023-02-06 DIAGNOSIS — F1290 Cannabis use, unspecified, uncomplicated: Secondary | ICD-10-CM | POA: Insufficient documentation

## 2023-02-06 DIAGNOSIS — E785 Hyperlipidemia, unspecified: Secondary | ICD-10-CM | POA: Diagnosis present

## 2023-02-06 HISTORY — PX: CORONARY ULTRASOUND/IVUS: CATH118244

## 2023-02-06 HISTORY — PX: LEFT HEART CATH AND CORONARY ANGIOGRAPHY: CATH118249

## 2023-02-06 HISTORY — PX: CORONARY STENT INTERVENTION: CATH118234

## 2023-02-06 LAB — CBC
HCT: 42.4 % (ref 36.0–46.0)
Hemoglobin: 14.2 g/dL (ref 12.0–15.0)
MCH: 31.4 pg (ref 26.0–34.0)
MCHC: 33.5 g/dL (ref 30.0–36.0)
MCV: 93.8 fL (ref 80.0–100.0)
Platelets: 324 10*3/uL (ref 150–400)
RBC: 4.52 MIL/uL (ref 3.87–5.11)
RDW: 12.1 % (ref 11.5–15.5)
WBC: 14.1 10*3/uL — ABNORMAL HIGH (ref 4.0–10.5)
nRBC: 0 % (ref 0.0–0.2)

## 2023-02-06 LAB — TROPONIN I (HIGH SENSITIVITY)
Troponin I (High Sensitivity): 85 ng/L — ABNORMAL HIGH (ref <18)
Troponin I (High Sensitivity): 8471 ng/L (ref <18)

## 2023-02-06 LAB — BASIC METABOLIC PANEL
Anion gap: 11 (ref 5–15)
BUN: 19 mg/dL (ref 8–23)
CO2: 27 mmol/L (ref 22–32)
Calcium: 9.3 mg/dL (ref 8.9–10.3)
Chloride: 103 mmol/L (ref 98–111)
Creatinine, Ser: 0.81 mg/dL (ref 0.44–1.00)
GFR, Estimated: >60 mL/min (ref >60)
Glucose, Bld: 184 mg/dL — ABNORMAL HIGH (ref 70–99)
Potassium: 3 mmol/L — ABNORMAL LOW (ref 3.5–5.1)
Sodium: 141 mmol/L (ref 135–145)

## 2023-02-06 LAB — MAGNESIUM: Magnesium: 2.2 mg/dL (ref 1.7–2.4)

## 2023-02-06 LAB — POCT ACTIVATED CLOTTING TIME: Activated Clotting Time: 314 s

## 2023-02-06 LAB — D-DIMER, QUANTITATIVE: D-Dimer, Quant: 0.31 ug{FEU}/mL (ref 0.00–0.50)

## 2023-02-06 LAB — MRSA NEXT GEN BY PCR, NASAL: MRSA by PCR Next Gen: NOT DETECTED

## 2023-02-06 SURGERY — LEFT HEART CATH AND CORONARY ANGIOGRAPHY
Anesthesia: LOCAL

## 2023-02-06 MED ORDER — LEVOTHYROXINE SODIUM 100 MCG PO TABS
100.0000 ug | ORAL_TABLET | ORAL | Status: DC
  Administered 2023-02-07: 100 ug via ORAL
  Filled 2023-02-06: qty 1

## 2023-02-06 MED ORDER — HYDRALAZINE HCL 20 MG/ML IJ SOLN
10.0000 mg | INTRAMUSCULAR | Status: AC | PRN
Start: 2023-02-06 — End: 2023-02-06

## 2023-02-06 MED ORDER — NITROGLYCERIN 1 MG/10 ML FOR IR/CATH LAB
INTRA_ARTERIAL | Status: DC | PRN
  Administered 2023-02-06 (×2): 200 ug via INTRACORONARY

## 2023-02-06 MED ORDER — LIDOCAINE HCL (PF) 1 % IJ SOLN
INTRAMUSCULAR | Status: DC | PRN
  Administered 2023-02-06: 2 mL

## 2023-02-06 MED ORDER — HEPARIN BOLUS VIA INFUSION
3500.0000 [IU] | Freq: Once | INTRAVENOUS | Status: AC
  Administered 2023-02-06: 3500 [IU] via INTRAVENOUS
  Filled 2023-02-06: qty 3500

## 2023-02-06 MED ORDER — VERAPAMIL HCL 2.5 MG/ML IV SOLN
INTRAVENOUS | Status: DC | PRN
  Administered 2023-02-06: 10 mL via INTRA_ARTERIAL

## 2023-02-06 MED ORDER — ACETAMINOPHEN 325 MG PO TABS: 650.0000 mg | ORAL_TABLET | ORAL | Status: DC | PRN

## 2023-02-06 MED ORDER — VERAPAMIL HCL 2.5 MG/ML IV SOLN
INTRAVENOUS | Status: AC
  Filled 2023-02-06: qty 2

## 2023-02-06 MED ORDER — SODIUM CHLORIDE 0.9% FLUSH: 3.0000 mL | INTRAVENOUS | Status: DC | PRN

## 2023-02-06 MED ORDER — MIDAZOLAM HCL 2 MG/2ML IJ SOLN
INTRAMUSCULAR | Status: AC
  Filled 2023-02-06: qty 2

## 2023-02-06 MED ORDER — LABETALOL HCL 5 MG/ML IV SOLN
10.0000 mg | INTRAVENOUS | Status: AC | PRN
Start: 2023-02-06 — End: 2023-02-06

## 2023-02-06 MED ORDER — ASPIRIN 81 MG PO CHEW
324.0000 mg | CHEWABLE_TABLET | Freq: Once | ORAL | Status: AC
  Administered 2023-02-06: 324 mg via ORAL
  Filled 2023-02-06: qty 4

## 2023-02-06 MED ORDER — SODIUM CHLORIDE 0.9 % WEIGHT BASED INFUSION
1.0000 mL/kg/h | INTRAVENOUS | Status: AC
  Administered 2023-02-06: 1 mL/kg/h via INTRAVENOUS

## 2023-02-06 MED ORDER — TICAGRELOR 90 MG PO TABS
ORAL_TABLET | ORAL | Status: DC | PRN
  Administered 2023-02-06: 180 mg via ORAL

## 2023-02-06 MED ORDER — POTASSIUM CHLORIDE CRYS ER 20 MEQ PO TBCR
40.0000 meq | EXTENDED_RELEASE_TABLET | Freq: Once | ORAL | Status: AC
  Administered 2023-02-06: 40 meq via ORAL
  Filled 2023-02-06: qty 2

## 2023-02-06 MED ORDER — DULOXETINE HCL 20 MG PO CPEP
20.0000 mg | ORAL_CAPSULE | Freq: Two times a day (BID) | ORAL | Status: DC
  Administered 2023-02-06 – 2023-02-07 (×2): 20 mg via ORAL
  Filled 2023-02-06 (×3): qty 1

## 2023-02-06 MED ORDER — HEPARIN SODIUM (PORCINE) 1000 UNIT/ML IJ SOLN
INTRAMUSCULAR | Status: AC
  Filled 2023-02-06: qty 10

## 2023-02-06 MED ORDER — SODIUM CHLORIDE 0.9% FLUSH
3.0000 mL | Freq: Two times a day (BID) | INTRAVENOUS | Status: DC
  Administered 2023-02-07: 3 mL via INTRAVENOUS

## 2023-02-06 MED ORDER — LIDOCAINE HCL (PF) 1 % IJ SOLN
INTRAMUSCULAR | Status: AC
Start: 2023-02-06 — End: ?
  Filled 2023-02-06: qty 30

## 2023-02-06 MED ORDER — TICAGRELOR 90 MG PO TABS
90.0000 mg | ORAL_TABLET | Freq: Two times a day (BID) | ORAL | Status: DC
  Administered 2023-02-06 – 2023-02-07 (×2): 90 mg via ORAL
  Filled 2023-02-06 (×2): qty 1

## 2023-02-06 MED ORDER — FENTANYL CITRATE (PF) 100 MCG/2ML IJ SOLN
INTRAMUSCULAR | Status: AC
  Filled 2023-02-06: qty 2

## 2023-02-06 MED ORDER — MIDAZOLAM HCL 2 MG/2ML IJ SOLN
INTRAMUSCULAR | Status: DC | PRN
  Administered 2023-02-06: 2 mg via INTRAVENOUS
  Administered 2023-02-06: 1 mg via INTRAVENOUS

## 2023-02-06 MED ORDER — HEPARIN (PORCINE) IN NACL 1000-0.9 UT/500ML-% IV SOLN
INTRAVENOUS | Status: DC | PRN
  Administered 2023-02-06 (×2): 500 mL

## 2023-02-06 MED ORDER — FENTANYL CITRATE (PF) 100 MCG/2ML IJ SOLN
INTRAMUSCULAR | Status: DC | PRN
  Administered 2023-02-06 (×2): 25 ug via INTRAVENOUS

## 2023-02-06 MED ORDER — NITROGLYCERIN 1 MG/10 ML FOR IR/CATH LAB
INTRA_ARTERIAL | Status: AC
  Filled 2023-02-06: qty 10

## 2023-02-06 MED ORDER — HEPARIN SODIUM (PORCINE) 1000 UNIT/ML IJ SOLN
INTRAMUSCULAR | Status: DC | PRN
  Administered 2023-02-06: 2000 [IU] via INTRAVENOUS
  Administered 2023-02-06: 4000 [IU] via INTRAVENOUS

## 2023-02-06 MED ORDER — GABAPENTIN 100 MG PO CAPS
200.0000 mg | ORAL_CAPSULE | Freq: Two times a day (BID) | ORAL | Status: DC
  Administered 2023-02-06 – 2023-02-07 (×2): 200 mg via ORAL
  Filled 2023-02-06 (×2): qty 2

## 2023-02-06 MED ORDER — ORAL CARE MOUTH RINSE: 15.0000 mL | OROMUCOSAL | Status: DC | PRN

## 2023-02-06 MED ORDER — METOPROLOL TARTRATE 12.5 MG HALF TABLET
12.5000 mg | ORAL_TABLET | Freq: Two times a day (BID) | ORAL | Status: DC
  Administered 2023-02-06 – 2023-02-07 (×2): 12.5 mg via ORAL
  Filled 2023-02-06 (×2): qty 1

## 2023-02-06 MED ORDER — IOHEXOL 350 MG/ML SOLN
INTRAVENOUS | Status: DC | PRN
  Administered 2023-02-06: 130 mL

## 2023-02-06 MED ORDER — NICOTINE 21 MG/24HR TD PT24
21.0000 mg | MEDICATED_PATCH | Freq: Once | TRANSDERMAL | Status: AC
Start: 2023-02-06 — End: 2023-02-07
  Administered 2023-02-06: 21 mg via TRANSDERMAL
  Filled 2023-02-06: qty 1

## 2023-02-06 MED ORDER — AMLODIPINE BESYLATE 10 MG PO TABS
10.0000 mg | ORAL_TABLET | Freq: Every day | ORAL | Status: DC
  Administered 2023-02-06 – 2023-02-07 (×2): 10 mg via ORAL
  Filled 2023-02-06 (×2): qty 1

## 2023-02-06 MED ORDER — ONDANSETRON HCL 4 MG/2ML IJ SOLN
4.0000 mg | Freq: Four times a day (QID) | INTRAMUSCULAR | Status: DC | PRN
Start: 2023-02-06 — End: 2023-02-07

## 2023-02-06 MED ORDER — NITROGLYCERIN 0.4 MG SL SUBL: 0.4000 mg | SUBLINGUAL_TABLET | SUBLINGUAL | Status: DC | PRN

## 2023-02-06 MED ORDER — HEPARIN (PORCINE) 25000 UT/250ML-% IV SOLN
700.0000 [IU]/h | INTRAVENOUS | Status: DC
  Administered 2023-02-06: 700 [IU]/h via INTRAVENOUS
  Filled 2023-02-06: qty 250

## 2023-02-06 MED ORDER — ASPIRIN 81 MG PO TBEC
81.0000 mg | DELAYED_RELEASE_TABLET | Freq: Every day | ORAL | Status: DC
  Administered 2023-02-07: 81 mg via ORAL
  Filled 2023-02-06: qty 1

## 2023-02-06 MED ORDER — SODIUM CHLORIDE 0.9 % IV SOLN: 250.0000 mL | INTRAVENOUS | Status: DC | PRN

## 2023-02-06 MED ORDER — LOSARTAN POTASSIUM 50 MG PO TABS
100.0000 mg | ORAL_TABLET | Freq: Every day | ORAL | Status: DC
  Administered 2023-02-06 – 2023-02-07 (×2): 100 mg via ORAL
  Filled 2023-02-06 (×2): qty 2

## 2023-02-06 MED ORDER — TICAGRELOR 90 MG PO TABS
ORAL_TABLET | ORAL | Status: AC
  Filled 2023-02-06: qty 2

## 2023-02-06 SURGICAL SUPPLY — 26 items
BALLN EMERGE MR 2.5X12 (BALLOONS) ×1
BALLN ~~LOC~~ EMERGE MR 3.25X20 (BALLOONS) ×1
BALLN ~~LOC~~ EMERGE MR 3.25X8 (BALLOONS) ×1
BALLN ~~LOC~~ EMERGE MR 3.5X12 (BALLOONS) ×1
BALLOON EMERGE MR 2.5X12 (BALLOONS) IMPLANT
BALLOON ~~LOC~~ EMERGE MR 3.25X20 (BALLOONS) IMPLANT
BALLOON ~~LOC~~ EMERGE MR 3.25X8 (BALLOONS) IMPLANT
BALLOON ~~LOC~~ EMERGE MR 3.5X12 (BALLOONS) IMPLANT
CATH INFINITI 5 FR JL3.5 (CATHETERS) IMPLANT
CATH INFINITI JR4 5F (CATHETERS) IMPLANT
CATH OPTICROSS HD (CATHETERS) IMPLANT
CATH VISTA GUIDE 6FR XBLAD3.5 (CATHETERS) IMPLANT
DEVICE RAD COMP TR BAND LRG (VASCULAR PRODUCTS) IMPLANT
GLIDESHEATH SLEND SS 6F .021 (SHEATH) IMPLANT
GUIDEWIRE INQWIRE 1.5J.035X260 (WIRE) IMPLANT
INQWIRE 1.5J .035X260CM (WIRE) ×1
KIT ENCORE 26 ADVANTAGE (KITS) IMPLANT
KIT HEMO VALVE WATCHDOG (MISCELLANEOUS) IMPLANT
PACK CARDIAC CATHETERIZATION (CUSTOM PROCEDURE TRAY) ×1 IMPLANT
SET ATX-X65L (MISCELLANEOUS) IMPLANT
SLED PULL BACK IVUS (MISCELLANEOUS) IMPLANT
STENT SYNERGY XD 3.0X16 (Permanent Stent) IMPLANT
STENT SYNERGY XD 3.0X32 (Permanent Stent) IMPLANT
SYNERGY XD 3.0X16 (Permanent Stent) ×1 IMPLANT
TUBING CIL FLEX 10 FLL-RA (TUBING) IMPLANT
WIRE RUNTHROUGH IZANAI 014 180 (WIRE) IMPLANT

## 2023-02-06 NOTE — Plan of Care (Signed)
  Problem: Activity: Goal: Ability to tolerate increased activity will improve Outcome: Progressing   Problem: Cardiac: Goal: Ability to achieve and maintain adequate cardiovascular perfusion will improve Outcome: Progressing   Problem: Health Behavior/Discharge Planning: Goal: Ability to safely manage health-related needs after discharge will improve Outcome: Progressing   Problem: Education: Goal: Knowledge of General Education information will improve Description: Including pain rating scale, medication(s)/side effects and non-pharmacologic comfort measures Outcome: Progressing

## 2023-02-06 NOTE — Telephone Encounter (Signed)
Pharmacy Patient Advocate Encounter  Insurance verification completed.    The patient is insured through [employer]. Patient has Medicare and is not eligible for a copay card, but may be able to apply for patient assistance, if available.    Ran test claim for Brilinta and the current 30 day co-pay is $47.00.   This test claim was processed through [facility]- copay amounts may vary at other pharmacies due to pharmacy/plan contracts, or as the patient moves through the different stages of their insurance plan.

## 2023-02-06 NOTE — ED Provider Triage Note (Addendum)
Emergency Medicine Provider Triage Evaluation Note  Amanda Kim , a 77 y.o. female  was evaluated in triage.  Pt complains of chest pain.  Began this morning with associated nausea/vomiting and diaphoresis.  Pain mostly center of chest, some radiation into the right arm.  Hx of same, awaiting cardiology evaluation later this month.  Tried home hydroxyzine and NTG without relief.  Review of Systems  Positive: Chest pain, diaphoresis, vomiting Negative: fever  Physical Exam  BP 119/64 (BP Location: Right Arm)   Pulse 67   Temp 98 F (36.7 C)   Resp 18   Ht 5\' 4"  (1.626 m)   Wt 57.6 kg   SpO2 99%   BMI 21.80 kg/m   Gen:   Awake, no distress   Resp:  Normal effort  MSK:   Moves extremities without difficulty  Other:    Medical Decision Making  Medically screening exam initiated at 4:51 AM.  Appropriate orders placed.  Amanda Kim was informed that the remainder of the evaluation will be completed by another provider, this initial triage assessment does not replace that evaluation, and the importance of remaining in the ED until their evaluation is complete.  Chest pain.  No prior cardiac hx, due to see cards later this month for consultation.  EKG, labs, CXR ordered.  6:09 AM Initial trop elevated at 85.  Charge RN made aware, will expedite room assignment.   Garlon Hatchet, PA-C 02/06/23 0536    Garlon Hatchet, PA-C 02/06/23 [phone]

## 2023-02-06 NOTE — ED Notes (Signed)
Patient transported to Cath lab 

## 2023-02-06 NOTE — Interval H&P Note (Signed)
History and Physical Interval Note:  02/06/2023 1:32 PM  Amanda Kim  has presented today for surgery, with the diagnosis of nstemi.  The various methods of treatment have been discussed with the patient and family. After consideration of risks, benefits and other options for treatment, the patient has consented to  Procedure(s): LEFT HEART CATH AND CORONARY ANGIOGRAPHY (N/A) as a surgical intervention.  The patient's history has been reviewed, patient examined, no change in status, stable for surgery.  I have reviewed the patient's chart and labs.  Questions were answered to the patient's satisfaction.     Tonny Bollman

## 2023-02-06 NOTE — Progress Notes (Signed)
PHARMACY - ANTICOAGULATION CONSULT NOTE  Pharmacy Consult for heparin Indication: chest pain/ACS  Allergies  Allergen Reactions   Sulfisoxazole Itching   Bee Venom Itching, Swelling and Rash   Penicillins Itching and Swelling    Patient Measurements: Height: 5\' 4"  (162.6 cm) Weight: 57.6 kg (127 lb) IBW/kg (Calculated) : 54.7 Heparin Dosing Weight: TBW  Vital Signs: Temp: 98 F (36.7 C) (12/20 0535) BP: 120/61 (12/20 1000) Pulse Rate: 64 (12/20 1000)  Labs: Recent Labs    02/06/23 0446 02/06/23 1025  HGB 14.2  --   HCT 42.4  --   PLT 324  --   CREATININE 0.81  --   TROPONINIHS 85* 8,471*    Estimated Creatinine Clearance: 51 mL/min (by C-G formula based on SCr of 0.81 mg/dL).   Medical History: Past Medical History:  Diagnosis Date   Back pain    d/t horse fell on pt in the 80's   Bone spur    in neck(since 1990) and 1st 3 fingers on right hand go numb   Chronic pain    d/t horse accident in the 80's   Headache(784.0)    tension   History of colon polyps    Hyperlipidemia    takes Fish Oil and CoQ10   Hypertension    takes Inderal daily   Hypothyroid 05/07/2011   takes Synthroid daily   Leg cramps    takes Potassium prn   Liver disease    nonalcholic fatty liver disease   Nausea & vomiting    phenergan and zofran prn   Spondylolisthesis    Spondylosis of thoracolumbar region without myelopathy or radiculopathy    spine   Urinary frequency     Assessment: Amanda Kim presenting with CP and SOB, elevated troponin, she is not on anticoagulation PTA  Goal of Therapy:  Heparin level 0.3-0.7 units/ml Monitor platelets by anticoagulation protocol: Yes   Plan:  Heparin 3500 units IV x 1, and gtt at 700 units/hr F/u 8 hour heparin level F/u cards eval and recs  Daylene Posey, PharmD, [facility] Clinical Pharmacist ED Pharmacist Phone # [phone] 02/06/2023 11:57 AM

## 2023-02-06 NOTE — ED Triage Notes (Signed)
Patient c/o chest pressure or heaviness that woke her up , states she was sweating  also had sob. C/o nausea states she has a history of esophageal spasms. States she took 1 ntg without relief. At first states pain was non-radiating , then states well it sometimes goes to her right arm. C/o pain being worse on movement and inspiration .

## 2023-02-06 NOTE — H&P (Addendum)
Cardiology Admission History and Physical   Patient ID: Amanda Kim MRN: [id number]; DOB: 07/17/1945   Admission date: 02/06/2023  PCP:  Georgianne Fick, MD    [facility]Care Providers Cardiologist:  None        Chief Complaint:  chest pressure  Patient Profile:   Amanda Kim is a 77 y.o. female with history of hypothyroidism, esophageal spasms, anxiety, tobacco use, s/p spinal stimulator who is being seen 02/06/2023 for the evaluation of chest pain.  History of Present Illness:   Amanda Kim woke up this morning around 2 AM with 8 out of 10 chest pressure with radiation into her arms.  Patient has a history of periodic esophageal spasms and initially believed this was the cause of her symptoms.  She has a prescription for as needed nitroglycerin to treat these esophageal spasms and so took 1 sublingual nitroglycerin.  This did not provide any relief of her symptoms.  Patient also has a prescription for hydroxyzine and took 1 of these as well.  Again no relief of her symptoms.  Patient continued to have chest pressure and also developed sudden nausea with an episode of emesis.  She did not feel acutely short of breath but does emphasize general malaise.  When patient continued to have discomfort this morning she had her [employer]B guest drive her to the emergency department.  At the time of my exam, patient with 4 out of 10 chest pressure.  Patient says that it is much improved from earlier this morning.  She continues to deny dyspnea.  Is no longer nauseous.  Patient denies recent cardiovascular symptoms, has not had any exertional chest pain or shortness of breath.  She does report increased fatigue but cites significant family stress as a possible cause of this.  Patient smokes approximately 15 cigarettes a day, has a smoking history of at least 60 years.  She takes medications for high blood pressure, high cholesterol, hypothyroidism.  No recent changes to  medicine.  Patient tells me that she actually has an upcoming appointment scheduled with cardiology.  She arranged this appointment after she had an episode of chest pressure a couple of months ago, described as very similar to the one that she had today that prompted her to come to the emergency department.   Past Medical History:  Diagnosis Date   Back pain    d/t horse fell on pt in the 80's   Bone spur    in neck(since 1990) and 1st 3 fingers on right hand go numb   Chronic pain    d/t horse accident in the 80's   Headache(784.0)    tension   History of colon polyps    Hyperlipidemia    takes Fish Oil and CoQ10   Hypertension    takes Inderal daily   Hypothyroid 05/07/2011   takes Synthroid daily   Leg cramps    takes Potassium prn   Liver disease    nonalcholic fatty liver disease   Nausea & vomiting    phenergan and zofran prn   Spondylolisthesis    Spondylosis of thoracolumbar region without myelopathy or radiculopathy    spine   Urinary frequency     Past Surgical History:  Procedure Laterality Date   CESAREAN SECTION  1967, 1970   CHOLECYSTECTOMY N/A 05/20/2012   Procedure: LAPAROSCOPIC CHOLECYSTECTOMY WITH INTRAOPERATIVE CHOLANGIOGRAM;  Surgeon: Robyne Askew, MD;  Location: [facility] OR;  Service: General;  Laterality: N/A;   COLONOSCOPY  TUBAL LIGATION  1981     Medications Prior to Admission: Prior to Admission medications   Medication Sig Start Date End Date Taking? Authorizing Provider  amLODipine (NORVASC) 10 MG tablet Take 10 mg by mouth daily.   Yes [provider]  Ascorbic Acid (VITAMIN C) 1000 MG tablet Take 1,000 mg by mouth daily.   Yes [provider]  atorvastatin (LIPITOR) 10 MG tablet Take 10 mg by mouth daily.   Yes [provider]  BLACK CURRANT SEED OIL PO Take 1 tablet by mouth daily.   Yes [provider]  Calcium Citrate-Vitamin D (CALCIUM CITRATE+D3 PO) Take 1 tablet by mouth daily.   Yes [provider]  CALCIUM PO Take 1 tablet by mouth daily.   Yes [provider]  COLLAGEN PO Take 1 Dose by mouth daily.   Yes [provider]  COLOSTRUM PO Take 1 Dose by mouth 4 (four) times a week.   Yes [provider]  diclofenac (VOLTAREN) 50 MG EC tablet Take 50 mg by mouth 2 (two) times daily.   Yes [provider]  DULoxetine (CYMBALTA) 20 MG capsule Take 20 mg by mouth 2 (two) times daily.   Yes [provider]  Estradiol (VAGIFEM) 10 MCG TABS vaginal tablet Place 10 mcg vaginally 2 (two) times a week.   Yes [provider]  gabapentin (NEURONTIN) 100 MG capsule Take 200 mg by mouth 2 (two) times daily.   Yes [provider]  hydrOXYzine (ATARAX) 25 MG tablet Take 25-75 mg by mouth daily as needed (Sleep).   Yes [provider]  ketoconazole (NIZORAL) 2 % shampoo Apply 1 Application topically 3 (three) times a week.   Yes [provider]  levothyroxine (SYNTHROID, LEVOTHROID) 100 MCG tablet Take 100 mcg by mouth every morning. 09/07/17  Yes [provider]  losartan (COZAAR) 100 MG tablet Take 100 mg by mouth daily.   Yes [provider]  nitroGLYCERIN (NITROSTAT) 0.4 MG SL tablet Place 0.4 mg under the tongue every 5 (five) minutes as needed for chest pain. 05/30/19  Yes [provider]  OVER THE COUNTER MEDICATION Take 750 mg by mouth daily. Green Lipped Mussels   Yes [provider]  OVER THE COUNTER MEDICATION Take 1 Dose by mouth at bedtime. Sound Asleep Gummy   Yes [provider]  oxyCODONE-acetaminophen (PERCOCET/ROXICET) 5-325 MG tablet Take 1 tablet by mouth at bedtime as needed for severe pain (pain score 7-10).   Yes [provider]  Sodium Hyaluronate, oral, (HYALURONIC ACID PO) Take 1 Dose by mouth daily.   Yes [provider]  valACYclovir (VALTREX) 1000 MG tablet Take 1,000 mg by mouth 2 (two) times daily as needed ([place]). 08/24/17   Yes [provider]  VITAMIN K PO Take 1 tablet by mouth daily.   Yes [provider]  brompheniramine-pseudoephedrine-DM 30-2-10 MG/5ML syrup Take 5 mLs by mouth 3 (three) times daily as needed (Cough). 11/21/22   [provider]     Allergies:    Allergies  Allergen Reactions   Sulfisoxazole Itching   Bee Venom Itching, Swelling and Rash   Penicillins Itching and Swelling    Social History:   Social History   Socioeconomic History   Marital status: Single    Spouse name: Not on file   Number of children: 2   Years of education: 1 yr colle   Highest education level: Not on file  Occupational History   Occupation:  retired-HR   Occupation: small business owner-consulting  Tobacco Use   Smoking status: Former    Current packs/day: 0.80    Average packs/day: 0.8 packs/day for 45.0 years (36.0 ttl pk-yrs)    Types: Cigarettes   Smokeless tobacco: Never   Tobacco comments:    Quit smoking on 02/2014  Substance and Sexual Activity   Alcohol use: Yes    Alcohol/week: 0.0 standard drinks of alcohol    Comment: red wine couple of times a week   Drug use: No   Sexual activity: Yes    Birth control/protection: Post-menopausal  Other Topics Concern   Not on file  Social History Narrative   09/07/12- Pt reports going to an internalist in [place] for chronic disease management.  Educated pt that family doctor and internalist are the same specialty and it would be better if only one doctor was managing her chronic disease.  Pt said she would like to stay with both and use [place] for acute visits.          Health Care POA: Sister, Sigurd Sos   Emergency Contact: Roselee Culver, [phone]   End of Life Plan:    Who lives with you: self   Any pets: none   Diet: Pt has a varied diet of protein, starch and vegetables.  Pt is currently on lower carb diet for weight loss.   Exercise: Pt exercises with friend 5-6x a week.   Seatbelts: Pt reports  wearing seatbelt when in vehicles.    Wynelle Link Exposure/Protection: Pt reports using sunscreen daily.   Hobbies: reading, gardening, decorating, art, walking, movies                     Social Drivers of Health   Financial Resource Strain: Not on file  Food Insecurity: Not on file  Transportation Needs: Not on file  Physical Activity: Not on file  Stress: Not on file  Social Connections: Unknown (07/01/2021)   Received from [facility], [facility]   Social Network    Social Network: Not on file  Intimate Partner Violence: Unknown (05/23/2021)   Received from [facility], [facility]   HITS    Physically Hurt: Not on file    Insult or Talk Down To: Not on file    Threaten Physical Harm: Not on file    Scream or Curse: Not on file    Family History:   The patient's family history includes Alcohol abuse in her mother; Bipolar disorder in her father; Cancer in her paternal grandmother; Colon cancer in her paternal grandmother; Depression in her father; Heart attack in her paternal grandfather; Heart disease in her paternal uncle; Hyperlipidemia in her paternal uncle; Hypertension in her mother.    ROS:  Please see the history of present illness.  All other ROS reviewed and negative.     Physical Exam/Data:   Vitals:   02/06/23 0830 02/06/23 0900 02/06/23 0930 02/06/23 1000  BP: 114/61 113/61 122/71 120/61  Pulse: (!) 58 (!) 59 65 64  Resp: 16 15 17 18   Temp:      SpO2: 96% 94% 94% 96%  Weight:      Height:       No intake or output data in the 24 hours ending 02/06/23 1305    02/06/2023    4:41 AM 10/24/2022    1:32 PM 10/10/2022    2:22 PM  Last 3 Weights  Weight (lbs) 127 lb 129 lb 1.6 oz 130  lb 8 oz  Weight (kg) 57.607 kg 58.559 kg 59.194 kg     Body mass index is 21.8 kg/m.  General:  Well nourished, well developed, in no acute distress HEENT: normal Neck: no JVD Vascular: No carotid bruits; Distal pulses 2+ bilaterally   Cardiac:  normal S1, S2; RRR; no  murmur  Lungs:  clear to auscultation bilaterally, no wheezing, rhonchi or rales  Abd: soft, nontender, no hepatomegaly  Ext: no edema Musculoskeletal:  No deformities, BUE and BLE strength normal and equal Skin: warm and dry  Neuro:  CNs 2-12 intact, no focal abnormalities noted Psych:  Normal affect    EKG:  The ECG that was done has significant baseline noise and is non-diagnostic.  Relevant CV Studies:  No prior studies  Laboratory Data:  High Sensitivity Troponin:   Recent Labs  Lab 02/06/23 0446 02/06/23 1025  TROPONINIHS 85* 8,471*      Chemistry Recent Labs  Lab 02/06/23 0446 02/06/23 1025  NA 141  --   K 3.0*  --   CL 103  --   CO2 27  --   GLUCOSE 184*  --   BUN 19  --   CREATININE 0.81  --   CALCIUM 9.3  --   MG  --  2.2  GFRNONAA >60  --   ANIONGAP 11  --     No results for input(s): "PROT", "ALBUMIN", "AST", "ALT", "ALKPHOS", "BILITOT" in the last 168 hours. Lipids No results for input(s): "CHOL", "TRIG", "HDL", "LABVLDL", "LDLCALC", "CHOLHDL" in the last 168 hours. Hematology Recent Labs  Lab 02/06/23 0446  WBC 14.1*  RBC 4.52  HGB 14.2  HCT 42.4  MCV 93.8  MCH 31.4  MCHC 33.5  RDW 12.1  PLT 324   Thyroid No results for input(s): "TSH", "FREET4" in the last 168 hours. BNPNo results for input(s): "BNP", "PROBNP" in the last 168 hours.  DDimer  Recent Labs  Lab 02/06/23 1025  DDIMER 0.31     Radiology/Studies:  DG Chest 2 View Result Date: 02/06/2023 CLINICAL DATA:  Chest pain/heaviness. EXAM: CHEST - 2 VIEW COMPARISON:  01/03/2020 FINDINGS: Heart size and mediastinal contours are within normal limits. No pleural fluid, interstitial edema, or airspace disease. Dorsal column stimulator is identified with lead terminating in the midthoracic canal. IMPRESSION: No acute cardiopulmonary disease. Electronically Signed   By: Signa Kell M.D.   On: 02/06/2023 05:52     Assessment and Plan:   NSTEMI  Patient with multiple risk  factors for coronary artery disease including hyperlipidemia, significant smoking history presented to the emergency department after developing 8 out of 10 chest pressure around 2 AM this morning.  Troponin initially 85, trended to 8471.  Chest pain now 4 out of 10.  Patient will need to undergo left heart catheterization today for evaluation of non-ST elevation myocardial infarction. ECG tracings available are nondiagnostic and not interpretable given baseline artifact and noise from nerve stimulator. We may need to temporarily turn off her device due to get complete ECG.  Left heart catheterization with Dr. Excell Seltzer.  Will obtain an echocardiogram following her ischemic evaluation today. Heparin per ACS protocol Load aspirin 324 mg We will plan to initiate beta-blocker therapy following left heart catheterization Increase atorvastatin to 80 mg from current home dose of 10 mg Check lipid panel and lipoprotein a   Informed Consent   Shared Decision Making/Informed Consent The risks [stroke (1 in 1000), death (1 in 1000), kidney failure [usually temporary] (1 in  500), bleeding (1 in 200), allergic reaction [possibly serious] (1 in 200)], benefits (diagnostic support and management of coronary artery disease) and alternatives of a cardiac catheterization were discussed in detail with Amanda Kim and she is willing to proceed.      Hypertension  Patient on losartan 100 mg along with amlodipine 10 mg per med reconciliation.  According to her, blood pressure is generally well-controlled.  She has not had these medications this morning but is not currently hypertensive.  Will resume home medications as able following left heart catheterization today.  Hypothyroidism  Continue home levothyroxine 100 mcg daily.  Tobacco use  Patient a current 15 cigarette per day smoker. Will need complete cessation. Nicotine patches ordered.   Risk Assessment/Risk Scores:    TIMI Risk Score for Unstable  Angina or Non-ST Elevation MI:   The patient's TIMI risk score is 4, which indicates a 20% risk of all cause mortality, new or recurrent myocardial infarction or need for urgent revascularization in the next 14 days.      Code Status: Full Code  Severity of Illness: The appropriate patient status for this patient is INPATIENT. Inpatient status is judged to be reasonable and necessary in order to provide the required intensity of service to ensure the patient's safety. The patient's presenting symptoms, physical exam findings, and initial radiographic and laboratory data in the context of their chronic comorbidities is felt to place them at high risk for further clinical deterioration. Furthermore, it is not anticipated that the patient will be medically stable for discharge from the hospital within 2 midnights of admission.   * I certify that at the point of admission it is my clinical judgment that the patient will require inpatient hospital care spanning beyond 2 midnights from the point of admission due to high intensity of service, high risk for further deterioration and high frequency of surveillance required.*   For questions or updates, please contact [facility] Please consult [email or web address] for contact info under     Signed, Perlie Gold, PA-C  02/06/2023 1:05 PM    ATTENDING ATTESTATION:  After conducting a review of all available clinical information with the care team, interviewing the patient, and performing a physical exam, I agree with the findings and plan described in this note.   GEN: No acute distress.   HEENT:  MMM, no JVD, no scleral icterus Cardiac: RRR, no murmurs, rubs, or gallops.  Respiratory: Clear to auscultation bilaterally. GI: Soft, nontender, non-distended  MS: No edema; No deformity. Neuro:  Nonfocal  Vasc:  +2 radial pulses  The patient is a 77 year old female with a history of hypothyroidism, esophageal spasm on as needed nitroglycerin,  tobacco abuse, and neuropathic pain status post nerve stimulator who is here with unstable angina and an elevated troponin.  She was in her normal state of health up until yesterday night when she developed chest pain, nausea, and emesis.  She presented to the emergency department where an EKG showed no ischemic changes however the EKG is marred by nerve stimulator artifact.  She is no longer having chest discomfort.  Given elevated troponin and concerning symptom complex, will refer for coronary angiography.  Further recommendations will be issued after the results of this test have been reviewed.  I have reviewed the risks, indications, and alternatives to cardiac catheterization, possible angioplasty, and stenting with the patient. Risks include but are not limited to bleeding, infection, vascular injury, stroke, myocardial infection, arrhythmia, kidney injury, radiation-related injury in the case of  prolonged fluoroscopy use, emergency cardiac surgery, and death. The patient understands the risks of serious complication is 1-2 in 1000 with diagnostic cardiac cath and 1-2% or less with angioplasty/stenting.    Alverda Skeans, MD Pager [phone]

## 2023-02-06 NOTE — ED Provider Notes (Signed)
EMERGENCY DEPARTMENT AT [facility] Provider Note   CSN: [id number] Arrival date & time: 02/06/23  0424     History  Chief Complaint  Amanda Kim presents with   Chest Pain    Amanda Kim is a 77 y.o. female history of hypothyroidism, esophageal spasms, anxiety, cigarette use, status post spinal stimulator presented with chest pain that woke Amanda Kim up out of sleep at 2 AM.  Amanda Kim states that Amanda Kim had abrupt chest discomfort/tightness that woke Amanda Kim up with some shortness of breath.  Amanda Kim denies history of PEs or blood clots or leg swelling recent travel/hospitalizations or surgeries or personal history cancer does endorse cigarette use.  Amanda Kim denies hemoptysis but states that Amanda Kim has a chronic cough due to Amanda Kim cigarette use.  Amanda Kim [place] fevers, sick contacts.  Amanda Kim states that Amanda Kim had arm numbness but was unsure what left or right arm I spoke to Amanda Kim about in triage stated that it was Amanda Kim right arm.  Amanda Kim denies back pain, left jaw pain.  Amanda Kim states that the chest tightness made Amanda Kim nauseous.  Amanda Kim does have history of esophageal spasms and states this feels very similar.  Amanda Kim did take 1 nitroglycerin at home that Amanda Kim was prescribed for esophageal spasms however this did not help.  Amanda Kim does not take aspirin and Plavix at home.  Home Medications Prior to Admission medications   Medication Sig Start Date End Date Taking? Authorizing Provider  amLODipine (NORVASC) 10 MG tablet Take 10 mg by mouth daily.   Yes [provider]  Ascorbic Acid (VITAMIN C) 1000 MG tablet Take 1,000 mg by mouth daily.   Yes [provider]  atorvastatin (LIPITOR) 10 MG tablet Take 10 mg by mouth daily.   Yes [provider]  BLACK CURRANT SEED OIL PO Take 1 tablet by mouth daily.   Yes [provider]  Calcium Citrate-Vitamin D (CALCIUM CITRATE+D3 PO) Take 1 tablet by mouth daily.   Yes [provider]  CALCIUM PO Take  1 tablet by mouth daily.   Yes [provider]  COLLAGEN PO Take 1 Dose by mouth daily.   Yes [provider]  COLOSTRUM PO Take 1 Dose by mouth 4 (four) times a week.   Yes [provider]  diclofenac (VOLTAREN) 50 MG EC tablet Take 50 mg by mouth 2 (two) times daily.   Yes [provider]  DULoxetine (CYMBALTA) 20 MG capsule Take 20 mg by mouth 2 (two) times daily.   Yes [provider]  Estradiol (VAGIFEM) 10 MCG TABS vaginal tablet Place 10 mcg vaginally 2 (two) times a week.   Yes [provider]  gabapentin (NEURONTIN) 100 MG capsule Take 200 mg by mouth 2 (two) times daily.   Yes [provider]  hydrOXYzine (ATARAX) 25 MG tablet Take 25-75 mg by mouth daily as needed (Sleep).   Yes [provider]  ketoconazole (NIZORAL) 2 % shampoo Apply 1 Application topically 3 (three) times a week.   Yes [provider]  levothyroxine (SYNTHROID, LEVOTHROID) 100 MCG tablet Take 100 mcg by mouth every morning. 09/07/17  Yes [provider]  losartan (COZAAR) 100 MG tablet Take 100 mg by mouth daily.   Yes [provider]  nitroGLYCERIN (NITROSTAT) 0.4 MG SL tablet Place 0.4 mg under the tongue every 5 (five) minutes as needed for chest pain. 05/30/19  Yes [provider]  OVER THE COUNTER MEDICATION Take 750 mg by mouth daily. Green Lipped  Mussels   Yes [provider]  OVER THE COUNTER MEDICATION Take 1 Dose by mouth at bedtime. Sound Asleep Gummy   Yes [provider]  oxyCODONE-acetaminophen (PERCOCET/ROXICET) 5-325 MG tablet Take 1 tablet by mouth at bedtime as needed for severe pain (pain score 7-10).   Yes [provider]  Sodium Hyaluronate, oral, (HYALURONIC ACID PO) Take 1 Dose by mouth daily.   Yes [provider]  valACYclovir (VALTREX) 1000 MG tablet Take 1,000 mg by mouth 2 (two) times daily as needed (Flares). 08/24/17  Yes [provider]   VITAMIN K PO Take 1 tablet by mouth daily.   Yes [provider]  brompheniramine-pseudoephedrine-DM 30-2-10 MG/5ML syrup Take 5 mLs by mouth 3 (three) times daily as needed (Cough). 11/21/22   [provider]      Allergies    Sulfisoxazole, Bee venom, and Penicillins    Review of Systems   Review of Systems  Cardiovascular:  Positive for chest pain.    Physical Exam Updated Vital Signs BP 120/61   Pulse 64   Temp 98 F (36.7 C)   Resp 18   Ht 5\' 4"  (1.626 m)   Wt 57.6 kg   SpO2 96%   BMI 21.80 kg/m  Physical Exam Constitutional:      General: Amanda Kim is not in acute distress. Eyes:     Extraocular Movements: Extraocular movements intact.     Conjunctiva/sclera: Conjunctivae normal.     Pupils: Pupils are equal, round, and reactive to light.  Cardiovascular:     Rate and Rhythm: Normal rate and regular rhythm.     Pulses: Normal pulses.     Heart sounds: Normal heart sounds.  Pulmonary:     Effort: Pulmonary effort is normal. No respiratory distress.     Breath sounds: Normal breath sounds.     Comments: Able to speak in full sentences Nonproductive cough in the room which Amanda Kim states is Amanda Kim chronic cigarette cough Abdominal:     General: There is no distension.     Palpations: Abdomen is soft.     Tenderness: There is no abdominal tenderness. There is no guarding or rebound.  Musculoskeletal:     Right lower leg: No tenderness. No edema.     Left lower leg: No tenderness. No edema.  Skin:    General: Skin is warm and dry.     Capillary Refill: Capillary refill takes less than 2 seconds.  Neurological:     Mental Status: Amanda Kim is alert and oriented to person, place, and time.  Psychiatric:        Mood and Affect: Mood normal.     ED Results / Procedures / Treatments   Labs (all labs ordered are listed, but only abnormal results are displayed) Labs Reviewed  BASIC METABOLIC PANEL - Abnormal; Notable for the following components:       Result Value   Potassium 3.0 (*)    Glucose, Bld 184 (*)    All other components within normal limits  CBC - Abnormal; Notable for the following components:   WBC 14.1 (*)    All other components within normal limits  TROPONIN I (HIGH SENSITIVITY) - Abnormal; Notable for the following components:   Troponin I (High Sensitivity) 85 (*)    All other components within normal limits  TROPONIN I (HIGH SENSITIVITY) - Abnormal; Notable for the following components:   Troponin I (High Sensitivity) 8,471 (*)    All other components within normal limits  MAGNESIUM  D-DIMER, QUANTITATIVE  HEPARIN LEVEL (UNFRACTIONATED)    EKG EKG Interpretation Date/Time:  Friday February 06 2023 07:30:04 EST Ventricular Rate:  59 PR Interval:  209 QRS Duration:  112 QT Interval:  449 QTC Calculation: 449 R Axis:   -51  Text Interpretation: Sinus or ectopic atrial rhythm LAD, consider left anterior fascicular block Consider anterior infarct Minimal ST depression, diffuse leads Artifact in lead(s) I II III aVR aVL aVF V1 V2 V3 V4 V5 V6 Confirmed by Edwin Dada ([id number]) on 02/06/2023 7:35:41 AM  Radiology DG Chest 2 View Result Date: 02/06/2023 CLINICAL DATA:  Chest pain/heaviness. EXAM: CHEST - 2 VIEW COMPARISON:  01/03/2020 FINDINGS: Heart size and mediastinal contours are within normal limits. No pleural fluid, interstitial edema, or airspace disease. Dorsal column stimulator is identified with lead terminating in the midthoracic canal. IMPRESSION: No acute cardiopulmonary disease. Electronically Signed   By: Signa Kell M.D.   On: 02/06/2023 05:52    Procedures .Critical Care  Performed by: Netta Corrigan, PA-C Authorized by: Netta Corrigan, PA-C   Critical care provider statement:    Critical care time (minutes):  50   Critical care time was exclusive of:  Separately billable procedures and treating other patients   Critical care was necessary to treat or prevent imminent or life-threatening  deterioration of the following conditions: ACS.   Critical care was time spent personally by me on the following activities:  Blood draw for specimens, development of treatment plan with Amanda Kim or surrogate, discussions with consultants, evaluation of Amanda Kim's response to treatment, examination of Amanda Kim, obtaining history from Amanda Kim or surrogate, review of old charts, re-evaluation of Amanda Kim's condition, pulse oximetry, ordering and review of radiographic studies, ordering and review of laboratory studies and ordering and performing treatments and interventions   I assumed direction of critical care for this Amanda Kim from another provider in my specialty: no     Care discussed with: admitting provider       Medications Ordered in ED Medications  heparin ADULT infusion 100 units/mL (25000 units/228mL) (700 Units/hr Intravenous New Bag/Given 02/06/23 1255)  nicotine (NICODERM CQ - dosed in mg/24 hours) patch 21 mg (21 mg Transdermal Patch Applied 02/06/23 1257)  aspirin EC tablet 81 mg (has no administration in time range)  nitroGLYCERIN (NITROSTAT) SL tablet 0.4 mg (has no administration in time range)  acetaminophen (TYLENOL) tablet 650 mg (has no administration in time range)  ondansetron (ZOFRAN) injection 4 mg (has no administration in time range)  metoprolol tartrate (LOPRESSOR) tablet 12.5 mg (has no administration in time range)  potassium chloride SA (KLOR-CON M) CR tablet 40 mEq (40 mEq Oral Given 02/06/23 1019)  aspirin chewable tablet 324 mg (324 mg Oral Given 02/06/23 1257)  heparin bolus via infusion 3,500 Units (3,500 Units Intravenous Bolus from Bag 02/06/23 1256)    ED Course/ Medical Decision Making/ A&P             HEART Score: 6                    Medical Decision Making Amount and/or Complexity of Data Reviewed Labs: ordered. Radiology: ordered.  Risk Prescription drug management.   Jaylinne Slappey Shur 77 y.o. presented today for chest pain. Working DDx  that I considered at this time includes, but not limited to, ACS, GERD, pulmonary embolism, community-acquired pneumonia, aortic dissection, pneumothorax, underlying bony abnormality, anemia, thyrotoxicosis, esophageal rupture, CHF exacerbation, valvular disorder, myocarditis, pericarditis, endocarditis, pericardial effusion/cardiac tamponade, pulmonary edema, gastritis/PUD,  esophagitis.  R/o Dx: GERD, pulmonary embolism, community-acquired pneumonia, aortic dissection, pneumothorax, underlying bony abnormality, anemia, thyrotoxicosis, esophageal rupture, CHF exacerbation, valvular disorder, myocarditis, pericarditis, endocarditis, pericardial effusion/cardiac tamponade, pulmonary edema, gastritis/PUD, esophagitis: These are considered less likely due to history of present illness and physical exam findings.  PE: d dimer negative Aortic Dissection: less likely based on the location, quality, onset, and severity of symptoms in this case. Amanda Kim also has a lack of underlying history of AD or TAA.   Review of prior external notes: 12/04/2022 office visit  Unique Tests and My Interpretation:  EKG: Sinus 59 bpm, difficult to read due to spinal stimulator however no obvious signs of ST elevations Troponin: 85, 8471 CXR: Unremarkable CBC: Leukocytosis 14.1 BMP: Hypokalemia 3.0 Magnesium: 2.2 Social Determinants of Health: none  Discussion with Independent Historian: None  Discussion of Management of Tests:  Robet Leu, PA-C Cardiology  Risk: High: hospitalization or escalation of hospital-level care  Risk Stratification Score: HEART Score: 6  Staffed with Wallace Cullens, DO  Plan: On exam Amanda Kim was in no acute distress with stable vitals. Amanda Kim's physical was unremarkable.  Labs and chest x-ray from triage do show slightly elevated troponin at 85.  Amanda Kim denies cardiac or respiratory history but does note that Amanda Kim was initially short of breath with the chest tightness.  Amanda Kim does have  history of smoking.  Given the abrupt shortness of breath and elevated troponin we will get D-dimer.  Amanda Kim stable at this time.  9:51 AM troponin D-dimer not back and so I called lab and they state they have not received blood samples yet and so nursing was notified.  Amanda Kim still stable.  D-dimer came back negative and so will not order CTA at this time.  Still waiting on Trope.  Amanda Kim's delta troponin came back at 8471 and so with normal EKG do suspect Amanda Kim is having NSTEMI.  Amanda Kim is already given aspirin but we will start Amanda Kim on heparin.  Amanda Kim is not having any chest pain and has not had any chest pain throughout Amanda Kim ED stay.  Cardiology was consulted and states that they will come down to see Amanda Kim to admit.  Amanda Kim stable for admission at this time.  Discussed smoking cessation with Amanda Kim and was they were offerred resources to help stop.  Total time was 5 min CPT code 16109.   This chart was dictated using voice recognition software.  Despite best efforts to proofread,  errors can occur which can change the documentation meaning.         Final Clinical Impression(s) / ED Diagnoses Final diagnoses:  NSTEMI (non-ST elevated myocardial infarction) [facility])    Rx / DC Orders ED Discharge Orders     None         Netta Corrigan, PA-C 02/06/23 1307    Franne Forts, DO 02/12/23 1628

## 2023-02-07 ENCOUNTER — Other Ambulatory Visit (HOSPITAL_COMMUNITY): Payer: Self-pay

## 2023-02-07 ENCOUNTER — Inpatient Hospital Stay (HOSPITAL_COMMUNITY): Payer: PPO

## 2023-02-07 ENCOUNTER — Telehealth: Payer: Self-pay | Admitting: Physician Assistant

## 2023-02-07 DIAGNOSIS — E876 Hypokalemia: Secondary | ICD-10-CM | POA: Insufficient documentation

## 2023-02-07 DIAGNOSIS — I2510 Atherosclerotic heart disease of native coronary artery without angina pectoris: Secondary | ICD-10-CM | POA: Insufficient documentation

## 2023-02-07 DIAGNOSIS — F1290 Cannabis use, unspecified, uncomplicated: Secondary | ICD-10-CM | POA: Insufficient documentation

## 2023-02-07 DIAGNOSIS — I214 Non-ST elevation (NSTEMI) myocardial infarction: Secondary | ICD-10-CM | POA: Diagnosis not present

## 2023-02-07 DIAGNOSIS — R079 Chest pain, unspecified: Secondary | ICD-10-CM | POA: Diagnosis not present

## 2023-02-07 LAB — BASIC METABOLIC PANEL
Anion gap: 11 (ref 5–15)
Anion gap: 7 (ref 5–15)
BUN: 9 mg/dL (ref 8–23)
BUN: 8 mg/dL (ref 8–23)
CO2: 24 mmol/L (ref 22–32)
CO2: 26 mmol/L (ref 22–32)
Calcium: 8.9 mg/dL (ref 8.9–10.3)
Calcium: 9.3 mg/dL (ref 8.9–10.3)
Chloride: 106 mmol/L (ref 98–111)
Chloride: 103 mmol/L (ref 98–111)
Creatinine, Ser: 0.61 mg/dL (ref 0.44–1.00)
Creatinine, Ser: 0.55 mg/dL (ref 0.44–1.00)
GFR, Estimated: >60 mL/min (ref >60)
GFR, Estimated: >60 mL/min (ref >60)
Glucose, Bld: 122 mg/dL — ABNORMAL HIGH (ref 70–99)
Glucose, Bld: 119 mg/dL — ABNORMAL HIGH (ref 70–99)
Potassium: 3 mmol/L — ABNORMAL LOW (ref 3.5–5.1)
Potassium: 3.4 mmol/L — ABNORMAL LOW (ref 3.5–5.1)
Sodium: 141 mmol/L (ref 135–145)
Sodium: 136 mmol/L (ref 135–145)

## 2023-02-07 LAB — ECHOCARDIOGRAM COMPLETE
Area-P 1/2: 3.21 cm2
Height: 64 in
S' Lateral: 3 cm
Weight: 2007.07 [oz_av]

## 2023-02-07 LAB — CBC
HCT: 40.8 % (ref 36.0–46.0)
Hemoglobin: 13.7 g/dL (ref 12.0–15.0)
MCH: 30.9 pg (ref 26.0–34.0)
MCHC: 33.6 g/dL (ref 30.0–36.0)
MCV: 91.9 fL (ref 80.0–100.0)
Platelets: 257 10*3/uL (ref 150–400)
RBC: 4.44 MIL/uL (ref 3.87–5.11)
RDW: 12.1 % (ref 11.5–15.5)
WBC: 11.9 10*3/uL — ABNORMAL HIGH (ref 4.0–10.5)
nRBC: 0 % (ref 0.0–0.2)

## 2023-02-07 LAB — MAGNESIUM: Magnesium: 2 mg/dL (ref 1.7–2.4)

## 2023-02-07 LAB — LIPID PANEL
Cholesterol: 125 mg/dL (ref 0–200)
HDL: 33 mg/dL — ABNORMAL LOW (ref >40)
LDL Cholesterol: 62 mg/dL (ref 0–99)
Total CHOL/HDL Ratio: 3.8 {ratio}
Triglycerides: 151 mg/dL — ABNORMAL HIGH (ref <150)
VLDL: 30 mg/dL (ref 0–40)

## 2023-02-07 MED ORDER — POTASSIUM CHLORIDE CRYS ER 20 MEQ PO TBCR
40.0000 meq | EXTENDED_RELEASE_TABLET | Freq: Every day | ORAL | 0 refills | Status: DC
  Filled 2023-02-07: qty 60, 30d supply, fill #0

## 2023-02-07 MED ORDER — ATORVASTATIN CALCIUM 40 MG PO TABS
40.0000 mg | ORAL_TABLET | Freq: Every day | ORAL | Status: DC
Start: 2023-02-07 — End: 2023-02-07
  Administered 2023-02-07: 40 mg via ORAL
  Filled 2023-02-07: qty 1

## 2023-02-07 MED ORDER — ATORVASTATIN CALCIUM 40 MG PO TABS
40.0000 mg | ORAL_TABLET | Freq: Every day | ORAL | 5 refills | Status: DC
  Filled 2023-02-07: qty 30, 30d supply, fill #0
  Filled 2023-03-02: qty 90, 90d supply, fill #1
  Filled 2023-05-12: qty 60, 60d supply, fill #2

## 2023-02-07 MED ORDER — TICAGRELOR 90 MG PO TABS
90.0000 mg | ORAL_TABLET | Freq: Two times a day (BID) | ORAL | 11 refills | Status: DC
  Filled 2023-02-07: qty 60, 30d supply, fill #0
  Filled 2023-03-02: qty 60, 30d supply, fill #1
  Filled 2023-04-06: qty 60, 30d supply, fill #2

## 2023-02-07 MED ORDER — ASPIRIN 81 MG PO TBEC
81.0000 mg | DELAYED_RELEASE_TABLET | Freq: Every day | ORAL | 11 refills | Status: DC
  Filled 2023-02-07: qty 30, 30d supply, fill #0
  Filled 2023-03-02: qty 30, 30d supply, fill #1
  Filled 2023-04-06: qty 30, 30d supply, fill #2
  Filled 2023-04-27: qty 90, 90d supply, fill #3
  Filled 2023-05-12: qty 30, 30d supply, fill #3
  Filled 2023-09-28: qty 30, 30d supply, fill #4
  Filled 2023-11-04: qty 30, 30d supply, fill #5
  Filled 2023-11-30: qty 30, 30d supply, fill #6

## 2023-02-07 MED ORDER — POTASSIUM CHLORIDE CRYS ER 20 MEQ PO TBCR
40.0000 meq | EXTENDED_RELEASE_TABLET | Freq: Once | ORAL | Status: AC
  Administered 2023-02-07: 40 meq via ORAL
  Filled 2023-02-07: qty 2

## 2023-02-07 MED ORDER — POTASSIUM CHLORIDE 10 MEQ/100ML IV SOLN
10.0000 meq | INTRAVENOUS | Status: AC
  Administered 2023-02-07: 10 meq via INTRAVENOUS
  Filled 2023-02-07: qty 100

## 2023-02-07 MED ORDER — PANTOPRAZOLE SODIUM 40 MG PO TBEC
40.0000 mg | DELAYED_RELEASE_TABLET | Freq: Every day | ORAL | 3 refills | Status: DC
  Filled 2023-02-07: qty 30, 30d supply, fill #0
  Filled 2023-03-02: qty 90, 90d supply, fill #1

## 2023-02-07 MED ORDER — METOPROLOL TARTRATE 25 MG PO TABS
12.5000 mg | ORAL_TABLET | Freq: Two times a day (BID) | ORAL | 5 refills | Status: DC
  Filled 2023-02-07: qty 30, 30d supply, fill #0

## 2023-02-07 MED ORDER — PANTOPRAZOLE SODIUM 40 MG PO TBEC
40.0000 mg | DELAYED_RELEASE_TABLET | Freq: Every day | ORAL | Status: DC
  Administered 2023-02-07: 40 mg via ORAL
  Filled 2023-02-07: qty 1

## 2023-02-07 MED ORDER — NICOTINE 21 MG/24HR TD PT24
21.0000 mg | MEDICATED_PATCH | TRANSDERMAL | 0 refills | Status: DC
  Filled 2023-02-07: qty 28, 28d supply, fill #0

## 2023-02-07 NOTE — Progress Notes (Signed)
CARDIAC REHAB PHASE I   PRE:  Rate/Rhythm: 61  BP:  Supine: 121/73     SaO2: 97 RA  MODE:  Ambulation: 800 ft   POST:  Rate/Rhythm: 70  BP:  Supine: 134/67      SaO2: 97 RA  Attempted to see pt earlier, but was with ultrasound, returned later to follow up. Pt tolerated exercise well and AMB 800 ft with no assistive device, and standby assist. Pt had no rest break, chest pain, SOB or pain. She felt very good going for a walk. Education given to pt on heart healthy diet, radial/femoral weight restrictions, adherence to NTG, Brilinta and ASA.  Home exercise guidelines given and will refer to cardiac rehab phase 2 at [facility] [place]. She sounds very interested in this program. Reinforced smoking cessation and gave multiple resources for help. Pt left in the chair with call bell in reach. All questions were answered and pt verbalized understanding.  Harrie Jeans ACSM-CEP 02/07/2023 11:47 AM

## 2023-02-07 NOTE — Progress Notes (Signed)
Progress Note  Patient Name: Amanda Kim Date of Encounter: 02/07/2023 Primary Cardiologist: None   Subjective   Amanda Kim, a 77 year old with a history of tobacco abuse, hypothyroidism, esophageal spasm, angina, sinus bradycardia, and hypertension, recently underwent heart catheterization due to a non-ST elevation myocardial infarction (NSTEMI). The procedure revealed a diffuse 80% blockage in the left anterior descending (LAD) artery and a 90% blockage in the mid-circumflex artery, both of which were treated with drug-eluting stent interventions on February 06, 2023.  The patient reports chronic pain and has been unable to quit smoking. She has tried to address these issues, including past trauma, through a trip to [place], but without success.   No chest pain.  No SOB with brilinta. No palpitations.   Vital Signs    Vitals:   02/06/23 1917 02/06/23 2324 02/07/23 0520 02/07/23 0721  BP: 132/60 131/70 128/69 112/67  Pulse: 65 63 71   Resp: 17 19 20 20   Temp: 98.9 F (37.2 C) 98.4 F (36.9 C) 98.6 F (37 C) 99.8 F (37.7 C)  TempSrc: Oral Oral Oral Oral  SpO2: 98% 99% 96%   Weight:      Height:        Intake/Output Summary (Last 24 hours) at 02/07/2023 0819 Last data filed at 02/07/2023 0540 Gross per 24 hour  Intake 1241.43 ml  Output --  Net 1241.43 ml   Filed Weights   02/06/23 0441 02/06/23 1654  Weight: 57.6 kg 56.9 kg    Physical Exam   GEN: No acute distress.   Neck: No JVD Cardiac: RRR, no murmurs, rubs, or gallops. R radial site without bruit or hematoma Respiratory: Clear to auscultation bilaterally. GI: Soft, nontender, non-distended  MS: No edema  Labs  Telemetry: SR   Chemistry Recent Labs  Lab 02/06/23 0446 02/07/23 0220  NA 141 141  K 3.0* 3.0*  CL 103 106  CO2 27 24  GLUCOSE 184* 122*  BUN 19 9  CREATININE 0.81 0.61  CALCIUM 9.3 8.9  GFRNONAA >60 >60  ANIONGAP 11 11     Hematology Recent Labs  Lab  02/06/23 0446 02/07/23 0220  WBC 14.1* 11.9*  RBC 4.52 4.44  HGB 14.2 13.7  HCT 42.4 40.8  MCV 93.8 91.9  MCH 31.4 30.9  MCHC 33.5 33.6  RDW 12.1 12.1  PLT 324 257    Cardiac EnzymesNo results for input(s): "TROPONINI" in the last 168 hours. No results for input(s): "TROPIPOC" in the last 168 hours.   BNPNo results for input(s): "BNP", "PROBNP" in the last 168 hours.   DDimer  Recent Labs  Lab 02/06/23 1025  DDIMER 0.31     Cardiac Studies   Cardiac Studies & Procedures   CARDIAC CATHETERIZATION  CARDIAC CATHETERIZATION 02/06/2023  Narrative 1.  Patent left main 2.  Patent RCA with mild nonobstructive plaquing 3.  Severe diffuse proximal LAD stenosis treated successfully with intravascular ultrasound-guided stenting using a 3.0 x 32 mm Synergy DES, reducing 80% stenosis to 0% 4.  Severe 90% stenosis of the mid circumflex/first OM treated with a 3.0 x 16 mm Synergy DES 5.  Normal LVEDP  DAPT with aspirin and ticagrelor x 12 months.  Aggressive risk reduction, tobacco cessation.  Okay for discharge tomorrow as long as no complications arise.  Findings Coronary Findings Diagnostic  Dominance: Right  Left Main Vessel is angiographically normal. The left main is angiographically normal and it bifurcates into the LAD and left circumflex  Left Anterior Descending Ost LAD  to Mid LAD lesion is 80% stenosed. The lesion is segmentally diseased from just beyond the first diagonal extending into the mid vessel over about 30 mm.  There is 80% stenosis followed by 75% stenosis with hypodense plaque throughout. Intravascular ultrasound confirms mixed fibrotic and calcific plaquing with high-grade lumen narrowing.  Post-PCI, the stent is well opposed throughout.  The first few millimeters of the stent are well-expanded but unopposed and so an additional postdilatation with a 3.5 mm noncompliant balloon is done over the proximal edge.  Left Circumflex Prox Cx lesion is 90% stenosed  with 90% stenosed side branch in 1st Mrg.  First Obtuse Marginal Branch  Right Coronary Artery There is mild diffuse disease throughout the vessel. The RCA is a dominant vessel.  There is mild plaquing in the mid vessel without significant stenosis.  The PDA and PLA branches are patent with no stenoses.  Intervention  Ost LAD to Mid LAD lesion Stent CATH VISTA GUIDE 6FR XBLAD3.5 guide catheter was inserted. Lesion crossed with guidewire using a WIRE RUNTHROUGH IZANAI 014 180. Pre-stent angioplasty was performed. A drug-eluting stent was successfully placed using a STENT SYNERGY XD 3.0X32. Post-stent angioplasty was performed using a BALLN [place] EMERGE MR 3.5X12. Maximum pressure:  16 atm. Post-Intervention Lesion Assessment The intervention was successful. Pre-interventional TIMI flow is 3. Post-intervention TIMI flow is 3. No complications occurred at this lesion. There is a 0% residual stenosis post intervention.  Prox Cx lesion with side branch in 1st Mrg Stent - Main Branch A drug-eluting stent was successfully placed using a SYNERGY XD 3.0X16 in the main branch. Post-stent angioplasty was performed using a BALLN  EMERGE MR 3.25X8. Maximum pressure:  16 atm. The lesion in the circumflex/OM is stented from the first obtuse marginal branch back into the proximal circumflex.  The AV circumflex is not significantly compromised post PCI.  There is TIMI-3 flow in both vessels.  There is a good step up proximally and stepdown distally off the stent edges. Post-Intervention Lesion Assessment The intervention was successful. Pre-interventional TIMI flow is 3. Post-intervention TIMI flow is 3. No complications occurred at this lesion. There is a 0% residual stenosis in the main branch post intervention. There is a 0% residual stenosis in the side branch post intervention.                     Assessment & Plan   Non-ST Elevation Myocardial Infarction (NSTEMI) NSTEMI with two drug-eluting  stent interventions in the LAD and mid-circumflex arteries on 02/06/2023. Currently on dual antiplatelet therapy with aspirin and ticagrelor. Asymptomatic bradycardia likely secondary to metoprolol. Discussed the importance of dual antiplatelet therapy to prevent stent thrombosis and potential bleeding risks. Informed about switching antiplatelet agents if ticagrelor causes significant dyspnea. - Continue aspirin and ticagrelor - Continue metoprolol 12.5 mg - Assess for cardiac rehab at follow-up - Counseled on potential dyspnea with ticagrelor and advise caffeine consumption if symptoms occur - Contact primary cardiologist if dyspnea persists for potential switch to another antiplatelet agent - if echo shows mild  - increased to atorvastatin 40 mg, LDL goal < 55  Hypertension Well-controlled on amlodipine and losartan. Discussed benefits of maintaining blood pressure control to reduce cardiovascular risk. - Continue amlodipine 10 mg - Continue losartan 100 mg  Hypokalemia Managed with potassium repletion. Electrolytes have improved. Discussed importance of maintaining electrolyte balance to prevent arrhythmias. - Monitor electrolytes  Hypothyroidism Well-managed on levothyroxine. Discussed importance of consistent thyroid hormone levels for metabolic health.  Tobacco Use Disorder Marijuana Use Disorder Currently using a nicotine patch. Encouraged smoking cessation and discussed cardiovascular benefits and challenges of quitting. - Offer nicotine patch at discharge - Continue to encourage smoking cessation   Chronic Pain Managed with diclofenac. Advised to minimize use due to increased GI bleed risk with dual antiplatelet therapy. Added pantoprazole for GI protection. Discussed NSAID risks and need for alternative pain management strategies at PCP f/u. - Minimize diclofenac use - Add pantoprazole 40 mg PO daily - Follow up with primary care for pain management  General Health  Maintenance Takes various supplements. No strong evidence of harm or benefit. No changes to vaginal estradiol, gabapentin, ketoconazole shampoo, or Atarax. Advised to review supplements with primary care  Follow-up - Follow up with Dr. Jacinto Halim on 02/13/2023.   For questions or updates, please contact [facility] Please consult [email or web address] for contact info under Cardiology/STEMI.      Riley Lam, MD FASE [facility] Cardiologist [facility]  [address] [place], [address] [place], [place] [address] [phone]  8:19 AM

## 2023-02-07 NOTE — Progress Notes (Signed)
Pt refusing PIV K runs, MD notified and new orders for oral K received.

## 2023-02-07 NOTE — Plan of Care (Signed)
  Problem: Education: Goal: Understanding of cardiac disease, CV risk reduction, and recovery process will improve Outcome: Progressing   Problem: Activity: Goal: Ability to tolerate increased activity will improve Outcome: Progressing   Problem: Cardiac: Goal: Ability to achieve and maintain adequate cardiovascular perfusion will improve Outcome: Progressing   Problem: Education: Goal: Knowledge of General Education information will improve Description: Including pain rating scale, medication(s)/side effects and non-pharmacologic comfort measures Outcome: Progressing   

## 2023-02-07 NOTE — Telephone Encounter (Signed)
   Transition of Care Follow-up Phone Call Request    Patient Name: Amanda Kim Date of Birth: [birth year] Date of Encounter: 02/07/2023  Primary Care Provider:  Georgianne Fick, MD Primary Cardiologist:  New to Dr. Lynnette Caffey but has f/u Dr. Jacinto Halim 12/27 which we will keep  Maylon Cos Leggio has been scheduled for a transition of care follow up appointment with a [facility] provider:  12/27 with Dr. Jacinto Halim  Please reach out to [facility] within 48 hours of discharge to confirm appointment and review transition of care protocol questionnaire. Anticipated discharge date: 02/07/23  Laurann Montana, PA-C  02/07/2023, 1:49 PM

## 2023-02-07 NOTE — Discharge Summary (Addendum)
Discharge Summary    Patient ID: Amanda Kim MRN: [id number]; DOB: [birth year]  Admit date: 02/06/2023 Discharge date: 02/07/2023  PCP:  Georgianne Fick, MD   [facility] Providers Cardiologist:  Seen by Dr. Lynnette Caffey inpatient, was pending outpatient appointment to establish with Dr. Jacinto Halim 12/27 which we will keep Click here to update MD or APP on Care Team, Refresh:1}     Discharge Diagnoses    Principal Problem:   NSTEMI (non-ST elevated myocardial infarction) [facility] Active Problems:   Hypothyroid   Chronic pain   Tobacco use   Hyperlipidemia   Essential hypertension   CAD (coronary artery disease)   Hypokalemia   Marijuana use    Diagnostic Studies/Procedures    Cath 02/06/23 1.  Patent left main  2.  Patent RCA with mild nonobstructive plaquing 3.  Severe diffuse proximal LAD stenosis treated successfully with intravascular ultrasound-guided stenting using a 3.0 x 32 mm Synergy DES, reducing 80% stenosis to 0% 4.  Severe 90% stenosis of the mid circumflex/first OM treated with a 3.0 x 16 mm Synergy DES 5.  Normal LVEDP   DAPT with aspirin and ticagrelor x 12 months.  Aggressive risk reduction, tobacco cessation.  Okay for discharge tomorrow as long as no complications arise.   2D echo 02/07/23  1. Left ventricular ejection fraction, by estimation, is 55 to 60%. The  left ventricle has normal function. The left ventricle has no regional  wall motion abnormalities. Left ventricular diastolic parameters are  indeterminate.   2. Right ventricular systolic function is normal. The right ventricular  size is normal.   3. The mitral valve is normal in structure. No evidence of mitral valve  regurgitation. No evidence of mitral stenosis.   4. The aortic valve was not well visualized. Aortic valve regurgitation  is not visualized. No aortic stenosis is present.   5. The inferior vena cava is normal in size with greater than 50%  respiratory  variability, suggesting right atrial pressure of 3 mmHg.  Comparison(s): No prior Echocardiogram.    _____________   History of Present Illness     Amanda Kim is a 77 y.o. female with hypothyroidism, esophageal spasms, anxiety, tobacco use, s/p spinal stimulator who presented to [facility] with chest discomfort. She awoke around 2am with 8 out of 10 chest pressure with radiation into her arms. Patient has a history of periodic esophageal spasms and initially believed this was the cause of her symptoms. She has a prescription for as needed nitroglycerin to treat these esophageal spasms and so took 1 sublingual nitroglycerin. This did not provide any relief of her symptoms. She also took a hydroxyzine without relief. She then developed sudden nausea with episode of emesis and generalized malaise. When patient continued to have discomfort through the morning, she had her [employer] guest drive her to the emergency department. She was found to have NSTEMI with initial troponin 85 trending upward to 8471. Initial ECG tracings were nondiagnostic due to baseline artifact and noise from nerve stimulator. She was started on aspirin, heparin, and admitted to the cardiology service with plan for cath.   Hospital Course   1. Non-ST Elevation Myocardial Infarction (NSTEMI)/CAD - s/p cath with DES to prox LAD and DES to mid Cx/OM1 - started on DAPT with aspirin and ticagrelor x 12 months - Dr. Izora Ribas discussed the importance of dual antiplatelet therapy to prevent stent thrombosis and potential bleeding risks - started on low dose metoprolol, dose limited by bradycardia - echo  showed EF 55-60%, no RWMA, no significant findings - advised to contact primary cardiologist if dyspnea persists for potential switch to another antiplatelet agent - atorvastatin titrated from 10mg  to 40mg  daily for goal LDL <55 - If the patient is tolerating statin at time of follow-up appointment, would consider rechecking liver  function/lipid panel in 6-8 weeks  2. Hypertension - well-controlled on amlodipine and losartan, continued at discharge  3. Hypokalemia - K 3.0 on arrival s/p supplementation - recheck this AM 3.0 s/p additional KCl with subsequent value 3.4 s/p additional KCl prior to DC - magnesium WNL - per Dr. Izora Ribas, advised to start KCl daily at discharge (starts tomorrow given repletion received here in the hospital) - please get BMET at f/u visit 02/13/23, added to appt notes   4. Hypothyroidism - managed on levothyroxine   5. Tobacco Use Disorder/Marijuana Use Disorder - Currently using a nicotine patch. Encouraged smoking cessation and discussed cardiovascular benefits and challenges of quitting. - Nicotine patch prescribed at discharge - smokes 15 cigarettes a day so provided rx to start 21mg  patch per day x4 week rx; please reassess success and plan for taper at follow-up   6. Chronic Pain - Managed with diclofenac. MD advised to minimize use due to increased GI bleed risk with dual antiplatelet therapy. Added pantoprazole for GI protection. Discussed NSAID risks and need for alternative pain management strategies at PCP f/u. - Minimize diclofenac use - Added pantoprazole 40 mg PO daily - Recommended to follow up with primary care for pain management   7. General Health Maintenance Takes various supplements. No strong evidence of harm or benefit. No changes to vaginal estradiol, gabapentin, ketoconazole shampoo, or Atarax. Advised to review supplements with primary care. Holding off supplemental vitamin K on list given MI.   Dr. Izora Ribas has seen and examined the patient today and feels she is stable for discharge. She has a previously existing appointment scheduled with Dr. Jacinto Halim 02/13/23 which Dr. Izora Ribas recommended to keep. Updated appt notes to reflect this is a TOC visit. Launched TOC phone call request. Meds sent to [facility] pharmacy per pt request. 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    Did the patient have an acute coronary syndrome (MI, NSTEMI, STEMI, etc) this admission?:  Yes                               AHA/ACC ACS Clinical Performance & Quality Measures: Aspirin prescribed? - Yes ADP Receptor Inhibitor (Plavix/Clopidogrel, Brilinta/Ticagrelor or Effient/Prasugrel) prescribed (includes medically managed patients)? - Yes Beta Blocker prescribed? - Yes High Intensity Statin (Lipitor 40-80mg  or Crestor 20-40mg ) prescribed? - Yes EF assessed during THIS hospitalization? - Yes For EF <40%, was ACEI/ARB prescribed? - Not Applicable (EF >/= 40%) For EF <40%, Aldosterone Antagonist (Spironolactone or Eplerenone) prescribed? - Not Applicable (EF >/= 40%) Cardiac Rehab Phase II ordered (including medically managed patients)? - Yes   A message has been sent to the [facility] Pool at the office where the patient should be seen for follow up. Code 44 resolved prior to DC. _____________  Discharge Vitals Blood pressure 134/67, pulse 71, temperature 99.8 F (37.7 C), temperature source Oral, resp. rate 16, height 5\' 4"  (1.626 m), weight 56.9 kg, SpO2 97%.  Filed Weights   02/06/23 0441 02/06/23 1654  Weight: 57.6 kg 56.9 kg    Labs & Radiologic  Studies    CBC Recent Labs    02/06/23 0446 02/07/23 0220  WBC 14.1* 11.9*  HGB 14.2 13.7  HCT 42.4 40.8  MCV 93.8 91.9  PLT 324 257   Basic Metabolic Panel Recent Labs    16/10/96 1025 02/07/23 0220 02/07/23 1036  NA  --  141 136  K  --  3.0* 3.4*  CL  --  106 103  CO2  --  24 26  GLUCOSE  --  122* 119*  BUN  --  9 8  CREATININE  --  0.61 0.55  CALCIUM  --  8.9 9.3  MG 2.2  --  2.0   High Sensitivity Troponin:   Recent Labs  Lab 02/06/23 0446 02/06/23 1025  TROPONINIHS 85* 8,471*    BNP Invalid input(s): "POCBNP" D-Dimer Recent Labs    02/06/23 1025  DDIMER 0.31   Hemoglobin A1C No results  for input(s): "HGBA1C" in the last 72 hours. Fasting Lipid Panel Recent Labs    02/07/23 0220  CHOL 125  HDL 33*  LDLCALC 62  TRIG 045*  CHOLHDL 3.8   Thyroid Function Tests No results for input(s): "TSH", "T4TOTAL", "T3FREE", "THYROIDAB" in the last 72 hours.  Invalid input(s): "FREET3" _____________  ECHOCARDIOGRAM COMPLETE Result Date: 02/07/2023    ECHOCARDIOGRAM REPORT   Patient Name:   MILLENIA CARANO [facility] Date of Exam: 02/07/2023 Medical Rec #:  [id number]              Height:       64.0 in Accession #:    [id number]             Weight:       125.4 lb Date of Birth:  Jul 14, 1945              BSA:          1.605 m Patient Age:    77 years               BP:           131/70 mmHg Patient Gender: F                      HR:           61 bpm. Exam Location:  Inpatient Procedure: 2D Echo, Color Doppler and Cardiac Doppler Indications:    chest pain  History:        Patient has no prior history of Echocardiogram examinations.                 Acute MI; Risk Factors:Hypertension.  Sonographer:    Melissa Morford RDCS (AE, PE) Referring Phys: [id number] MICHAEL COOPER IMPRESSIONS  1. Left ventricular ejection fraction, by estimation, is 55 to 60%. The left ventricle has normal function. The left ventricle has no regional wall motion abnormalities. Left ventricular diastolic parameters are indeterminate.  2. Right ventricular systolic function is normal. The right ventricular size is normal.  3. The mitral valve is normal in structure. No evidence of mitral valve regurgitation. No evidence of mitral stenosis.  4. The aortic valve was not well visualized. Aortic valve regurgitation is not visualized. No aortic stenosis is present.  5. The inferior vena cava is normal in size with greater than 50% respiratory variability, suggesting right atrial pressure of 3 mmHg. Comparison(s): No prior Echocardiogram. FINDINGS  Left Ventricle: Difficult TDI acquisition. Left ventricular ejection fraction, by estimation, is  55 to 60%. The left ventricle has normal  function. The left ventricle has no regional wall motion abnormalities. The left ventricular internal cavity size was normal in size. There is no left ventricular hypertrophy. Left ventricular diastolic parameters are indeterminate. Right Ventricle: The right ventricular size is normal. No increase in right ventricular wall thickness. Right ventricular systolic function is normal. Left Atrium: Left atrial size was normal in size. Right Atrium: Right atrial size was normal in size. Pericardium: There is no evidence of pericardial effusion. Mitral Valve: The mitral valve is normal in structure. No evidence of mitral valve regurgitation. No evidence of mitral valve stenosis. Tricuspid Valve: The tricuspid valve is normal in structure. Tricuspid valve regurgitation is trivial. No evidence of tricuspid stenosis. Aortic Valve: The aortic valve was not well visualized. Aortic valve regurgitation is not visualized. No aortic stenosis is present. Pulmonic Valve: The pulmonic valve was normal in structure. Pulmonic valve regurgitation is not visualized. No evidence of pulmonic stenosis. Aorta: The aortic root, ascending aorta and aortic arch are all structurally normal, with no evidence of dilitation or obstruction. Venous: The inferior vena cava is normal in size with greater than 50% respiratory variability, suggesting right atrial pressure of 3 mmHg. IAS/Shunts: No atrial level shunt detected by color flow Doppler.  LEFT VENTRICLE PLAX 2D LVIDd:         4.20 cm   Diastology LVIDs:         3.00 cm   LV e' medial:    5.33 cm/s LV PW:         1.00 cm   LV E/e' medial:  17.6 LV IVS:        0.80 cm   LV e' lateral:   7.40 cm/s LVOT diam:     2.00 cm   LV E/e' lateral: 12.7 LV SV:         59 LV SV Index:   37 LVOT Area:     3.14 cm  RIGHT VENTRICLE RV S prime:     13.10 cm/s TAPSE (M-mode): 2.2 cm LEFT ATRIUM           Index        RIGHT ATRIUM           Index LA diam:      2.80 cm 1.75  cm/m   RA Area:     15.30 cm LA Vol (A4C): 34.3 ml 21.38 ml/m  RA Volume:   39.20 ml  24.43 ml/m  AORTIC VALVE LVOT Vmax:   87.60 cm/s LVOT Vmean:  66.800 cm/s LVOT VTI:    0.188 m  AORTA Ao Root diam: 2.10 cm Ao Asc diam:  3.00 cm MITRAL VALVE MV Area (PHT): 3.21 cm     SHUNTS MV Decel Time: 236 msec     Systemic VTI:  0.19 m MV E velocity: 93.87 cm/s   Systemic Diam: 2.00 cm MV A velocity: 115.00 cm/s MV E/A ratio:  0.82 Riley Lam MD Electronically signed by Riley Lam MD Signature Date/Time: 02/07/2023/10:14:02 AM    Final    CARDIAC CATHETERIZATION Result Date: 02/06/2023 1.  Patent left main 2.  Patent RCA with mild nonobstructive plaquing 3.  Severe diffuse proximal LAD stenosis treated successfully with intravascular ultrasound-guided stenting using a 3.0 x 32 mm Synergy DES, reducing 80% stenosis to 0% 4.  Severe 90% stenosis of the mid circumflex/first OM treated with a 3.0 x 16 mm Synergy DES 5.  Normal LVEDP DAPT with aspirin and ticagrelor x 12 months.  Aggressive risk reduction, tobacco cessation.  Okay for discharge tomorrow as long as no complications arise.  DG Chest 2 View Result Date: 02/06/2023 CLINICAL DATA:  Chest pain/heaviness. EXAM: CHEST - 2 VIEW COMPARISON:  01/03/2020 FINDINGS: Heart size and mediastinal contours are within normal limits. No pleural fluid, interstitial edema, or airspace disease. Dorsal column stimulator is identified with lead terminating in the midthoracic canal. IMPRESSION: No acute cardiopulmonary disease. Electronically Signed   By: Signa Kell M.D.   On: 02/06/2023 05:52   Disposition   Pt is being discharged home today in good condition.  Follow-up Plans & Appointments     Follow-up Information     Yates Decamp, MD Follow up.   Specialty: Cardiology Why: Keep follow-up as scheduled 02/13/23 Contact information: [address] [place] [address] [phone]                Discharge  Instructions     Amb Referral to Cardiac Rehabilitation   Complete by: As directed    Pt is interested in [facility] cardiac rehab. It is very close to her home.   Diagnosis:  NSTEMI Coronary Stents     After initial evaluation and assessments completed: Virtual Based Care may be provided alone or in conjunction with Phase 2 Cardiac Rehab based on patient barriers.: Yes   Intensive Cardiac Rehabilitation (ICR) [facility] location only OR Traditional Cardiac Rehabilitation (TCR) *If criteria for ICR are not met will enroll in TCR [facility] only): Yes   Diet - low sodium heart healthy   Complete by: As directed    Increase activity slowly   Complete by: As directed    No driving for 1 week. No lifting over 10 lbs for 2 weeks. No sexual activity for 2 weeks. 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 Keep procedure site clean & dry. If you notice increased pain, swelling, bleeding or pus, call/return!  You may shower, but no soaking baths/hot tubs/pools for 1 week.        Discharge Medications   Allergies as of 02/07/2023       Reactions   Sulfisoxazole Itching   Bee Venom Itching, Swelling, Rash   Penicillins Itching, Swelling        Medication List     STOP taking these medications    VITAMIN K PO       TAKE these medications    amLODipine 10 MG tablet Commonly known as: NORVASC Take 10 mg by mouth daily.   aspirin EC 81 MG tablet Take 1 tablet (81 mg total) by mouth daily. Swallow whole. Start taking on: February 08, 2023   atorvastatin 40 MG tablet Commonly known as: LIPITOR Take 1 tablet (40 mg total) by mouth daily. What changed:  medication strength how much to take   BLACK CURRANT SEED OIL PO Take 1 tablet by mouth daily.   brompheniramine-pseudoephedrine-DM 30-2-10 MG/5ML syrup Take 5 mLs by mouth 3 (three) times daily as needed (Cough).   CALCIUM CITRATE+D3 PO Take 1 tablet by mouth daily.   CALCIUM PO Take 1 tablet by mouth daily.    COLLAGEN PO Take 1 Dose by mouth daily.   COLOSTRUM PO Take 1 Dose by mouth 4 (four) times a week.   diclofenac 50 MG EC tablet Commonly known as: VOLTAREN Take 50 mg by mouth 2 (two) times daily.   DULoxetine 20 MG capsule Commonly known as: CYMBALTA Take 20 mg by mouth 2 (two) times daily.   gabapentin 100 MG capsule Commonly  known as: NEURONTIN Take 200 mg by mouth 2 (two) times daily.   HYALURONIC ACID PO Take 1 Dose by mouth daily.   hydrOXYzine 25 MG tablet Commonly known as: ATARAX Take 25-75 mg by mouth daily as needed (Sleep).   ketoconazole 2 % shampoo Commonly known as: NIZORAL Apply 1 Application topically 3 (three) times a week.   levothyroxine 100 MCG tablet Commonly known as: SYNTHROID Take 100 mcg by mouth every morning.   losartan 100 MG tablet Commonly known as: COZAAR Take 100 mg by mouth daily.   metoprolol tartrate 25 MG tablet Commonly known as: LOPRESSOR Take 0.5 tablets (12.5 mg total) by mouth 2 (two) times daily.   nicotine 21 mg/24hr patch Commonly known as: NICODERM CQ - dosed in mg/24 hours Place 1 patch (21 mg total) onto the skin daily.   nitroGLYCERIN 0.4 MG SL tablet Commonly known as: NITROSTAT Place 0.4 mg under the tongue every 5 (five) minutes as needed for chest pain.   OVER THE COUNTER MEDICATION Take 750 mg by mouth daily. Green Lipped Mussels   OVER THE COUNTER MEDICATION Take 1 Dose by mouth at bedtime. Sound Asleep Gummy   oxyCODONE-acetaminophen 5-325 MG tablet Commonly known as: PERCOCET/ROXICET Take 1 tablet by mouth at bedtime as needed for severe pain (pain score 7-10).   pantoprazole 40 MG tablet Commonly known as: PROTONIX Take 1 tablet (40 mg total) by mouth daily. Start taking on: February 08, 2023   potassium chloride SA 20 MEQ tablet Commonly known as: KLOR-CON M Take 2 tablets (40 mEq total) by mouth daily. Start taking on: February 08, 2023   ticagrelor 90 MG Tabs tablet Commonly known as:  BRILINTA Take 1 tablet (90 mg total) by mouth 2 (two) times daily.   Vagifem 10 MCG Tabs vaginal tablet Generic drug: Estradiol Place 10 mcg vaginally 2 (two) times a week.   valACYclovir 1000 MG tablet Commonly known as: VALTREX Take 1,000 mg by mouth 2 (two) times daily as needed ([place]res).   vitamin C 1000 MG tablet Take 1,000 mg by mouth daily.           Outstanding Labs/Studies   Recommend BMET in follow-up If the patient is tolerating statin at time of follow-up appointment, would consider rechecking liver function/lipid panel in 6-8 weeks.  Duration of Discharge Encounter   Greater than 30 minutes including physician time.  Signed, Laurann Montana, PA-C 02/07/2023, 2:22 PM

## 2023-02-09 ENCOUNTER — Encounter (HOSPITAL_COMMUNITY): Payer: Self-pay

## 2023-02-09 ENCOUNTER — Encounter (HOSPITAL_COMMUNITY): Payer: Self-pay | Admitting: Cardiovascular Disease

## 2023-02-09 DIAGNOSIS — F4323 Adjustment disorder with mixed anxiety and depressed mood: Secondary | ICD-10-CM | POA: Diagnosis not present

## 2023-02-09 NOTE — Telephone Encounter (Signed)
Patient contacted regarding discharge from [facility] on 02/07/23.  Patient understands to follow up with provider Dr. Jacinto Halim  on Friday 02/13/23  at 0840 at [facility] location. Patient understands discharge instructions? yes Patient understands medications and regiment? yes Patient understands to bring all medications to this visit? yes

## 2023-02-09 NOTE — Telephone Encounter (Signed)
Left voicemail to return call to office.

## 2023-02-09 NOTE — Telephone Encounter (Signed)
Pt returning nurses phone call. Please advise ?

## 2023-02-10 ENCOUNTER — Telehealth: Payer: Self-pay | Admitting: *Deleted

## 2023-02-10 LAB — LIPOPROTEIN A (LPA): Lipoprotein (a): 31.4 nmol/L — ABNORMAL HIGH (ref <75.0)

## 2023-02-10 NOTE — Transitions of Care (Post Inpatient/ED Visit) (Signed)
   02/10/2023  Name: Amanda Kim MRN: [id number] DOB: [birth year]  Today's TOC FU Call Status: Today's TOC FU Call Status:: Unsuccessful Call (1st Attempt) Unsuccessful Call (1st Attempt) Date: 02/10/23  Attempted to reach the patient regarding the most recent Inpatient/ED visit.  Follow Up Plan: Additional outreach attempts will be made to reach the patient to complete the Transitions of Care (Post Inpatient/ED visit) call.   Gean Maidens BSN RN Population Health- Transition of Care Team.  [facility] [phone]

## 2023-02-10 NOTE — Transitions of Care (Post Inpatient/ED Visit) (Signed)
02/10/2023  Name: Amanda Kim MRN: [id number] DOB: Jul 06, 1945  Today's TOC FU Call Status: Today's TOC FU Call Status:: Successful TOC FU Call Completed TOC FU Call Complete Date: 02/10/23 Patient's Name and Date of Birth confirmed.  Transition Care Management Follow-up Telephone Call Date of Discharge: 02/07/23 Discharge Facility: [facility]) Type of Discharge: Inpatient Admission Primary Inpatient Discharge Diagnosis:: non-ST elevated myocardial infarction How have you been since you were released from the hospital?: Better Any questions or concerns?: No  Items Reviewed: Did you receive and understand the discharge instructions provided?: Yes Medications obtained,verified, and reconciled?: Yes (Medications Reviewed) Any new allergies since your discharge?: No Dietary orders reviewed?: No Do you have support at home?: Yes People in Home: alone Name of Support/Comfort Primary Source: [place] sister and per patient a lot of friends  Medications Reviewed Today: Medications Reviewed Today     Reviewed by Luella Cook, RN (Case Manager) on 02/10/23 at 1258  Med List Status: <None>   Medication Order Taking? Sig Documenting Provider Last Dose Status Informant  amLODipine (NORVASC) 10 MG tablet [id number] Yes Take 10 mg by mouth daily. [provider] Taking Active Self, Pharmacy Records  Ascorbic Acid (VITAMIN C) 1000 MG tablet [id number] Yes Take 1,000 mg by mouth daily. [provider] Taking Active Self, Pharmacy Records  aspirin EC 81 MG tablet [id number] Yes Take 1 tablet (81 mg total) by mouth daily. Swallow whole. Christell Constant, MD Taking Active   atorvastatin (LIPITOR) 40 MG tablet [id number] Yes Take 1 tablet (40 mg total) by mouth daily. Christell Constant, MD Taking Active   BLACK CURRANT SEED OIL PO [id number] Yes Take 1 tablet by mouth daily. [provider] Taking Active Self, Pharmacy Records   brompheniramine-pseudoephedrine-DM 30-2-10 MG/5ML syrup [id number] Yes Take 5 mLs by mouth 3 (three) times daily as needed (Cough). [provider] Taking Active Self, Pharmacy Records           Med Note Jola Schmidt   Fri Feb 06, 2023  7:42 AM) >30 days. Still has if needed.   Calcium Citrate-Vitamin D (CALCIUM CITRATE+D3 PO) [id number] Yes Take 1 tablet by mouth daily. [provider] Taking Active Self, Pharmacy Records  CALCIUM PO [id number] Yes Take 1 tablet by mouth daily. [provider] Taking Active Self, Pharmacy Records  COLLAGEN PO [id number] Yes Take 1 Dose by mouth daily. [provider] Taking Active Self, Pharmacy Records  COLOSTRUM PO [id number] Yes Take 1 Dose by mouth 4 (four) times a week. [provider] Taking Active Self, Pharmacy Records  diclofenac (VOLTAREN) 50 MG EC tablet [id number] Yes Take 50 mg by mouth 2 (two) times daily. [provider] Taking Active Self, Pharmacy Records           Med Note Jola Schmidt   Fri Feb 06, 2023  7:53 AM) LF 01/25/23  DULoxetine (CYMBALTA) 20 MG capsule [id number] Yes Take 20 mg by mouth 2 (two) times daily. [provider] Taking Active Self, Pharmacy Records  Estradiol (VAGIFEM) 10 MCG TABS vaginal tablet [id number] Yes Place 10 mcg vaginally 2 (two) times a week. [provider] Taking Active Self, Pharmacy Records  gabapentin (NEURONTIN) 100 MG capsule [id number] Yes Take 200 mg by mouth 2 (two) times daily. [provider] Taking Active Self, Pharmacy Records  hydrOXYzine (ATARAX) 25 MG tablet [id number] Yes Take 25-75 mg by mouth daily as needed (Sleep). [provider] Taking Active Self, Pharmacy Records  ketoconazole (NIZORAL)  2 % shampoo [id number] Yes Apply 1 Application topically 3 (three) times a week. [provider] Taking Active Self, Pharmacy Records  levothyroxine (SYNTHROID, LEVOTHROID) 100 MCG tablet [id number] Yes Take  100 mcg by mouth every morning. [provider] Taking Active Self, Pharmacy Records  losartan (COZAAR) 100 MG tablet [id number] Yes Take 100 mg by mouth daily. [provider] Taking Active Self, Pharmacy Records  metoprolol tartrate (LOPRESSOR) 25 MG tablet [id number] Yes Take 0.5 tablets (12.5 mg total) by mouth 2 (two) times daily. Christell Constant, MD Taking Active   nicotine (NICODERM CQ - DOSED IN MG/24 HOURS) 21 mg/24hr patch [id number] Yes Place 1 patch (21 mg total) onto the skin daily. Christell Constant, MD Taking Active   nitroGLYCERIN (NITROSTAT) 0.4 MG SL tablet [id number] Yes Place 0.4 mg under the tongue every 5 (five) minutes as needed for chest pain. [provider] Taking Active Self, Pharmacy Records  OVER THE COUNTER MEDICATION [id number] Yes Take 750 mg by mouth daily. [employer], Historical, MD Taking Active Self, Pharmacy Records  OVER THE COUNTER MEDICATION [id number] Yes Take 1 Dose by mouth at bedtime. Sound Asleep Gummy [provider] Taking Active Self, Pharmacy Records  oxyCODONE-acetaminophen (PERCOCET/ROXICET) 5-325 MG tablet [id number] Yes Take 1 tablet by mouth at bedtime as needed for severe pain (pain score 7-10). [provider] Taking Active Self, Pharmacy Records           Med Note Jola Schmidt   Fri Feb 06, 2023  7:55 AM) LF for Oxycodone IR 5mg  in 02/2022. Patient adamant she is taking Percocet 5-325mg  sparingly PRN.   pantoprazole (PROTONIX) 40 MG tablet [id number] Yes Take 1 tablet (40 mg total) by mouth daily. Christell Constant, MD Taking Active   potassium chloride SA (KLOR-CON M) 20 MEQ tablet [id number] Yes Take 2 tablets (40 mEq total) by mouth daily. Laurann Montana, PA-C Taking Active   Sodium Hyaluronate, oral, (HYALURONIC ACID PO) [id number] Yes Take 1 Dose by mouth daily. [provider] Taking Active Self, Pharmacy Records  ticagrelor (BRILINTA) 90 MG TABS tablet  [id number] Yes Take 1 tablet (90 mg total) by mouth 2 (two) times daily. Christell Constant, MD Taking Active   valACYclovir (VALTREX) 1000 MG tablet [id number] Yes Take 1,000 mg by mouth 2 (two) times daily as needed (Flares). [provider] Taking Active Self, Pharmacy Records           Med Note Jola Schmidt   Fri Feb 06, 2023  7:49 AM) >30 days             Home Care and Equipment/Supplies: Were Home Health Services Ordered?: NA Any new equipment or medical supplies ordered?: NA  Functional Questionnaire: Do you need assistance with bathing/showering or dressing?: No Do you need assistance with meal preparation?: No Do you need assistance with eating?: No Do you have difficulty maintaining continence: No Do you need assistance with getting out of bed/getting out of a chair/moving?: No Do you have difficulty managing or taking your medications?: No  Follow up appointments reviewed: PCP Follow-up appointment confirmed?: NA Specialist Hospital Follow-up appointment confirmed?: Yes Date of Specialist follow-up appointment?: 02/13/23 Follow-Up Specialty Provider:: Dr Shon Baton Do you need transportation to your follow-up appointment?: No Do you understand care options if your condition(s) worsen?: Yes-patient verbalized understanding  SDOH Interventions Today    Flowsheet Row Most Recent Value  SDOH Interventions   Food Insecurity Interventions Intervention Not Indicated  Housing Interventions  Intervention Not Indicated  Transportation Interventions Intervention Not Indicated, Patient Resources (Friends/Family)  Utilities Interventions Intervention Not Indicated      Interventions Today    Flowsheet Row Most Recent Value  Chronic Disease   Chronic disease during today's visit Other  [non-ST elevated myocardial infarction]  General Interventions   General Interventions Discussed/Reviewed General Interventions Discussed, General Interventions Reviewed,  Communication with  Communication with Social Work  Exercise Interventions   Exercise Discussed/Reviewed Exercise Discussed  [patient is walking dog for 45 minutes]  Mental Health Interventions   Mental Health Discussed/Reviewed Coping Strategies, Refer to Social Work for counseling  Refer to Social Work for counseling regarding Anxiety/Coping  Pharmacy Interventions   Pharmacy Dicussed/Reviewed Pharmacy Topics Discussed, Pharmacy Topics Reviewed       Referred to Reece Levy SW [id number] 10:30 Patient declined further care coordination outreach calls Patient consented to social worker calling her  Gean Maidens BSN RN Population Health- Transition of Care Team.  [facility] [phone]

## 2023-02-12 NOTE — Progress Notes (Signed)
Cardiology Office Note:  .   Date:  02/13/2023  ID:  Amanda Kim, DOB 07-Apr-1945, MRN [id number] PCP: Georgianne Fick, MD  [facility] Providers Cardiologist:  Yates Decamp, MD   History of Present Illness: .   Amanda Kim is a 77 y.o. Caucasian female patient with hypothyroidism, esophageal dysmotility and spasms, tobacco use disorder, chronic back pain SP spinal stimulator placement presented to the emergency department on 02/06/2023 with chest pain and found to have NSTEMI and underwent successful angioplasty and stenting to proximal LAD and mid circumflex on the same day and discharged home the following day now presents for follow-up.  Past medical history significant for hypertension, hypercholesterolemia. This is a 1 week TOC visit.   Discussed the use of AI scribe software for clinical note transcription with the patient, who gave verbal consent to proceed.  History of Present Illness   The patient, with a history of coronary artery disease, recently underwent stent placement in two coronary arteries. She reports a constant feeling of heaviness in the chest, which is less intense than the pain experienced during her heart attack. The patient also experiences shortness of breath, particularly when walking her dog. She has noticed a significant decrease in her exercise tolerance, from being able to walk for 45 minutes to only 10 minutes. The patient is also dealing with esophageal spasms, which she manages with nitroglycerin as needed. She has a history of smoking and is currently trying to quit with the help of nicotine patches. She has reduced her smoking from 15-20 cigarettes a day to three and has joined a quit smoking program.     Review of Systems  Cardiovascular:  Positive for chest pain and dyspnea on exertion. Negative for leg swelling.   Labs   Lab Results  Component Value Date   CHOL 125 02/07/2023   HDL 33 (L) 02/07/2023   LDLCALC 62  02/07/2023   TRIG 151 (H) 02/07/2023   CHOLHDL 3.8 02/07/2023   Lab Results  Component Value Date   NA 136 02/07/2023   K 3.4 (L) 02/07/2023   CO2 26 02/07/2023   GLUCOSE 119 (H) 02/07/2023   BUN 8 02/07/2023   CREATININE 0.55 02/07/2023   CALCIUM 9.3 02/07/2023   GFRNONAA >60 02/07/2023      Latest Ref Rng & Units 02/07/2023   10:36 AM 02/07/2023    2:20 AM 02/06/2023    4:46 AM  BMP  Glucose 70 - 99 mg/dL 782  956  213   BUN 8 - 23 mg/dL 8  9  19    Creatinine 0.44 - 1.00 mg/dL 0.86  5.78  4.69   Sodium 135 - 145 mmol/L 136  141  141   Potassium 3.5 - 5.1 mmol/L 3.4  3.0  3.0   Chloride 98 - 111 mmol/L 103  106  103   CO2 22 - 32 mmol/L 26  24  27    Calcium 8.9 - 10.3 mg/dL 9.3  8.9  9.3       Latest Ref Rng & Units 02/07/2023    2:20 AM 02/06/2023    4:46 AM 10/10/2022    3:21 PM  CBC  WBC 4.0 - 10.5 K/uL 11.9  14.1  12.4   Hemoglobin 12.0 - 15.0 g/dL 62.9  52.8  41.3   Hematocrit 36.0 - 46.0 % 40.8  42.4  44.6   Platelets 150 - 400 K/uL 257  324  311    Lab Results  Component Value  Date   TSH 5.324 (H) 12/14/2012    Lab Results  Component Value Date   HGBA1C 5.9 09/11/2011     Physical Exam:   VS:  BP 110/62 (BP Location: Left Arm, Patient Position: Sitting, Cuff Size: Normal)   Pulse 66   Resp 16   Ht 5\' 4"  (1.626 m)   Wt 125 lb (56.7 kg)   SpO2 95%   BMI 21.46 kg/m    Wt Readings from Last 3 Encounters:  02/13/23 125 lb (56.7 kg)  02/06/23 125 lb 7.1 oz (56.9 kg)  10/24/22 129 lb 1.6 oz (58.6 kg)     Physical Exam Neck:     Vascular: No carotid bruit or JVD.  Cardiovascular:     Rate and Rhythm: Normal rate and regular rhythm.     Pulses: Intact distal pulses.     Heart sounds: Normal heart sounds. No murmur heard.    No gallop.  Pulmonary:     Effort: Pulmonary effort is normal.     Breath sounds: Normal breath sounds.  Abdominal:     General: Bowel sounds are normal.     Palpations: Abdomen is soft.  Musculoskeletal:     Right  lower leg: No edema.     Left lower leg: No edema.    Studies Reviewed: Marland Kitchen    Left Heart Catheterization 02/06/23 1.  Patent left main  2.  Patent RCA with mild nonobstructive plaquing 3.  Severe diffuse proximal LAD stenosis treated successfully with intravascular ultrasound-guided stenting using a 3.0 x 32 mm Synergy DES, reducing 80% stenosis to 0% 4.  Severe 90% stenosis of the mid circumflex/first OM treated with a 3.0 x 16 mm Synergy DES 5.  Normal LVEDP    DAPT with aspirin and ticagrelor x 12 months.  Aggressive risk reduction, tobacco cessation.  Okay for discharge tomorrow as long as no complications arise.   2D echo 02/07/23 1. Left ventricular ejection fraction, by estimation, is 55 to 60%. The left ventricle has normal function. The left ventricle has no regional wall motion abnormalities. Left ventricular diastolic parameters are indeterminate. 2. Right ventricular systolic function is normal. The right ventricular size is normal. 3. The mitral valve is normal in structure. No evidence of mitral valve regurgitation. No evidence of mitral stenosis. 4. The aortic valve was not well visualized. Aortic valve regurgitation is not visualized. No aortic stenosis is present. 5. The inferior vena cava is normal in size with greater than 50% respiratory variability, suggesting right atrial pressure of 3 mmHg. EKG:    EKG 02/07/2023: Normal sinus rhythm at a rate of 60 bpm, inferior infarct old.  Poor R progression, cannot exclude anteroseptal infarct old probably normal variant. Medications and allergies    Allergies  Allergen Reactions   Sulfisoxazole Itching   Bee Venom Itching, Swelling and Rash   Penicillins Itching and Swelling    Current Outpatient Medications:    amLODipine (NORVASC) 10 MG tablet, Take 10 mg by mouth daily., Disp: , Rfl:    Ascorbic Acid (VITAMIN C) 1000 MG tablet, Take 1,000 mg by mouth daily., Disp: , Rfl:    aspirin EC 81 MG tablet, Take 1 tablet (81  mg total) by mouth daily. Swallow whole., Disp: 30 tablet, Rfl: 11   atorvastatin (LIPITOR) 40 MG tablet, Take 1 tablet (40 mg total) by mouth daily., Disp: 30 tablet, Rfl: 5   BLACK CURRANT SEED OIL PO, Take 1 tablet by mouth daily., Disp: , Rfl:  brompheniramine-pseudoephedrine-DM 30-2-10 MG/5ML syrup, Take 5 mLs by mouth 3 (three) times daily as needed (Cough)., Disp: , Rfl:    Calcium Citrate-Vitamin D (CALCIUM CITRATE+D3 PO), Take 1 tablet by mouth daily., Disp: , Rfl:    CALCIUM PO, Take 1 tablet by mouth daily., Disp: , Rfl:    COLLAGEN PO, Take 1 Dose by mouth daily., Disp: , Rfl:    COLOSTRUM PO, Take 1 Dose by mouth 4 (four) times a week., Disp: , Rfl:    diclofenac (VOLTAREN) 50 MG EC tablet, Take 50 mg by mouth 2 (two) times daily., Disp: , Rfl:    DULoxetine (CYMBALTA) 20 MG capsule, Take 20 mg by mouth 2 (two) times daily., Disp: , Rfl:    Estradiol (VAGIFEM) 10 MCG TABS vaginal tablet, Place 10 mcg vaginally 2 (two) times a week., Disp: , Rfl:    gabapentin (NEURONTIN) 100 MG capsule, Take 200 mg by mouth 2 (two) times daily., Disp: , Rfl:    hydrOXYzine (ATARAX) 25 MG tablet, Take 25-75 mg by mouth daily as needed (Sleep)., Disp: , Rfl:    ketoconazole (NIZORAL) 2 % shampoo, Apply 1 Application topically 3 (three) times a week., Disp: , Rfl:    levothyroxine (SYNTHROID, LEVOTHROID) 100 MCG tablet, Take 100 mcg by mouth every morning., Disp: , Rfl: 0   losartan (COZAAR) 100 MG tablet, Take 100 mg by mouth daily., Disp: , Rfl:    metoprolol succinate (TOPROL XL) 25 MG 24 hr tablet, Take 1 tablet (25 mg total) by mouth daily., Disp: 90 tablet, Rfl: 3   nicotine (NICODERM CQ - DOSED IN MG/24 HOURS) 21 mg/24hr patch, Place 1 patch (21 mg total) onto the skin daily., Disp: 28 patch, Rfl: 0   OVER THE COUNTER MEDICATION, Take 750 mg by mouth daily. [employer], Disp: , Rfl:    OVER THE COUNTER MEDICATION, Take 1 Dose by mouth at bedtime. Sound Asleep Gummy, Disp: , Rfl:     oxyCODONE-acetaminophen (PERCOCET/ROXICET) 5-325 MG tablet, Take 1 tablet by mouth at bedtime as needed for severe pain (pain score 7-10)., Disp: , Rfl:    pantoprazole (PROTONIX) 40 MG tablet, Take 1 tablet (40 mg total) by mouth daily., Disp: 30 tablet, Rfl: 3   potassium chloride SA (KLOR-CON M) 20 MEQ tablet, Take 2 tablets (40 mEq total) by mouth daily., Disp: 60 tablet, Rfl: 0   Sodium Hyaluronate, oral, (HYALURONIC ACID PO), Take 1 Dose by mouth daily., Disp: , Rfl:    ticagrelor (BRILINTA) 90 MG TABS tablet, Take 1 tablet (90 mg total) by mouth 2 (two) times daily., Disp: 60 tablet, Rfl: 11   valACYclovir (VALTREX) 1000 MG tablet, Take 1,000 mg by mouth 2 (two) times daily as needed (Flares)., Disp: , Rfl: 2   nitroGLYCERIN (NITROSTAT) 0.4 MG SL tablet, Place 1 tablet (0.4 mg total) under the tongue every 5 (five) minutes as needed for chest pain., Disp: 25 tablet, Rfl: 3   ASSESSMENT AND PLAN: .      ICD-10-CM   1. Coronary artery disease of native artery of native heart with stable angina pectoris (HCC)  I25.118 EKG 12-Lead    nitroGLYCERIN (NITROSTAT) 0.4 MG SL tablet    AMB referral to cardiac rehabilitation    2. Primary hypertension  I10     3. Mixed hyperlipidemia  E78.2     4. Tobacco use  Z72.0     5. H/O esophageal spasm  Z87.19 nitroGLYCERIN (NITROSTAT) 0.4 MG SL tablet  Assessment and Plan    Coronary Artery Disease Stent placement in LAD and circumflex arteries on 02/06/2023. Reports of constant, non-exertional chest discomfort, not as intense as the pain experienced during the heart attack. Possible sensation of stent presence or could be related to Brilinta with dyspnea.  Advised her to see how she will do when she has not taken Brilinta when she goes for a walk and also to consider drinking a cup of coffee after taking Brilinta to alleviate symptoms of dyspnea. . -Continue Aspirin and Brilinta.  DAPT for 1 year and aspirin indefinitely. -Consider stress test  if symptoms or dyspnea persist.  Chest pain symptoms appear to be more consistent with GERD and esophageal dysmotility and angina. -Refer to cardiac rehab. -Change Metoprolol Tartrate 12.5 mg twice daily to Metoprolol Succinate 25mg  once daily.  Esophageal Spasms Reports of occasional spasms. -Provide Nitroglycerin for use as needed.  Also discussed regarding use of nitroglycerin for angina pectoris.  Tobacco Use Current smoker, attempting to quit with nicotine patch.  Presently smoking 3 to 4 cigarettes a day from >1 pack of cigarettes a day. -Encourage continued efforts to quit smoking.  We discussed fixing February 17, 2023 as quit date.  She is now planning to enroll in [place] quit program.  Hypertension Well controlled on Losartan 100mg  daily. -Continue current management.  Hyperlipidemia On Lipitor 40mg  daily. -Continue current management.  Goal LDL <70.  She will need lipid profile testing at some point soon.  Follow-up in 3 months.        Cardiac Rehabilitation Eligibility Assessment  The patient is ready to start cardiac rehabilitation from a cardiac standpoint.       Signed,  Yates Decamp, MD, [facility] 02/13/2023, 5:36 PM [facility] [address] [place], [place] [address] Phone: [phone]. Fax:  [phone]

## 2023-02-13 ENCOUNTER — Encounter: Payer: Self-pay | Admitting: Cardiology

## 2023-02-13 ENCOUNTER — Ambulatory Visit: Payer: PPO | Attending: Cardiology | Admitting: Cardiology

## 2023-02-13 VITALS — BP 110/62 | HR 66 | Resp 16 | Ht 64.0 in | Wt 125.0 lb

## 2023-02-13 DIAGNOSIS — I10 Essential (primary) hypertension: Secondary | ICD-10-CM

## 2023-02-13 DIAGNOSIS — I25118 Atherosclerotic heart disease of native coronary artery with other forms of angina pectoris: Secondary | ICD-10-CM | POA: Diagnosis not present

## 2023-02-13 DIAGNOSIS — Z8719 Personal history of other diseases of the digestive system: Secondary | ICD-10-CM

## 2023-02-13 DIAGNOSIS — E782 Mixed hyperlipidemia: Secondary | ICD-10-CM

## 2023-02-13 DIAGNOSIS — Z720 Tobacco use: Secondary | ICD-10-CM | POA: Diagnosis not present

## 2023-02-13 MED ORDER — METOPROLOL SUCCINATE ER 25 MG PO TB24
25.0000 mg | ORAL_TABLET | Freq: Every day | ORAL | 3 refills | Status: DC
  Filled 2023-03-02 – 2023-03-05 (×2): qty 90, 90d supply, fill #0

## 2023-02-13 MED ORDER — NITROGLYCERIN 0.4 MG SL SUBL
0.4000 mg | SUBLINGUAL_TABLET | SUBLINGUAL | 3 refills | Status: AC | PRN
  Filled 2023-03-02: qty 25, 1d supply, fill #0
  Filled 2024-01-25: qty 25, 1d supply, fill #1

## 2023-02-13 NOTE — Patient Instructions (Addendum)
Medication Instructions:  STOP Metoprolol Tartrate/Lopressor START Metoprolol Succinate/Toprol XL 25mg  daily TAKE Nitroglycerin 0.4mg  as needed for chest pain *If you need a refill on your cardiac medications before your next appointment, please call your pharmacy*  Follow-Up: At [facility], you and your health needs are our priority.  As part of our continuing mission to provide you with exceptional heart care, we have created designated Provider Care Teams.  These Care Teams include your primary Cardiologist (physician) and Advanced Practice Providers (APPs -  Physician Assistants and Nurse Practitioners) who all work together to provide you with the care you need, when you need it.  Your next appointment:   3 month(s)  Provider:   Yates Decamp, MD

## 2023-02-16 DIAGNOSIS — F4323 Adjustment disorder with mixed anxiety and depressed mood: Secondary | ICD-10-CM | POA: Diagnosis not present

## 2023-02-17 ENCOUNTER — Telehealth: Payer: Self-pay | Admitting: Cardiology

## 2023-02-17 ENCOUNTER — Encounter: Payer: Self-pay | Admitting: Cardiology

## 2023-02-17 NOTE — Telephone Encounter (Signed)
Yes you are allowed to do Stretch exercises (What you are doing is not Yoga, although people spuriously call it that way).  Brilinta is the medication that you may try to change timing of taking it. I will also send her MyChart Message

## 2023-02-17 NOTE — Telephone Encounter (Signed)
 Patient wants to know if she can get a massage and if it would be OK for her to do gentle yoga.    Patient also stated that Dr. Ladona told her she could try comparing not take one of her medications prior to walking her dog and taking the medication prior to walking her dog but she does not remember which medication.

## 2023-02-17 NOTE — Telephone Encounter (Signed)
Pt aware Brilinta was the medication may change the time of taking Will forward to Dr Shon Baton for review if pt may proceed with  massage and if may participate in gentle  yoga ./cy

## 2023-02-19 ENCOUNTER — Encounter: Payer: Self-pay | Admitting: Cardiology

## 2023-02-19 NOTE — Telephone Encounter (Signed)
 Per hospital discharge summary  Chronic Pain - Managed with diclofenac . MD advised to minimize use due to increased GI bleed risk with dual antiplatelet therapy. Added pantoprazole  for GI protection. Discussed NSAID risks and need for alternative pain management strategies at PCP f/u. - Minimize diclofenac  use - Added pantoprazole  40 mg PO daily - Recommended to follow up with primary care for pain management

## 2023-02-20 ENCOUNTER — Ambulatory Visit: Payer: Self-pay | Admitting: Licensed Clinical Social Worker

## 2023-02-20 ENCOUNTER — Encounter: Payer: PPO | Admitting: *Deleted

## 2023-02-20 NOTE — Patient Instructions (Signed)
 Visit Information  Thank you for taking time to visit with me today. Please don't hesitate to contact me if I can be of assistance to you.   Following are the goals we discussed today:   Goals Addressed             This Visit's Progress    To better manage MH and stress       Care Coordination Interventions: Reviewed all upcoming appointments in Epic system Ensured referral was made to Cardiac Rehab/look out for follow up from cardiac rehab nurse Motivational Interviewing employed PHQ2/ PHQ9 completed Solution-Focused Strategies employed:  Active listening / Reflection utilized  Emotional Support Provided Continue participation with community counselor Look for ways to reduce smoking, set practical goals (setting number of cigarettes in morning/noon/evening) Follow up with [place] quits Continue with self-care: arts, walking gentle yoga, friends group          Our next appointment is by telephone on 03/06/23 at [facility]  Please call the care guide team at [phone] if you need to cancel or reschedule your appointment.   If you are experiencing a Mental Health or Behavioral Health Crisis or need someone to talk to, please call the Suicide and Crisis Lifeline: 988  Patient verbalizes understanding of instructions and care plan provided today and agrees to view in MyChart. Active MyChart status and patient understanding of how to access instructions and care plan via MyChart confirmed with patient.     Telephone follow up appointment with care management team member scheduled for: 03/06/23

## 2023-02-20 NOTE — Patient Outreach (Signed)
  Care Coordination   Initial Visit Note   02/20/2023 Name: Amanda Kim MRN: [id number] DOB: [birth year]  Amanda Kim is a 78 y.o. year old female who sees Amanda Lombard, MD for primary care. I spoke with  Amanda Kim by phone today.  What matters to the patients health and wellness today?  To continue working on my stress and smoking.    Goals Addressed             This Visit's Progress    To better manage MH and stress       Care Coordination Interventions: Reviewed all upcoming appointments in Epic system Ensured referral was made to Cardiac Rehab/look out for follow up from cardiac rehab nurse Motivational Interviewing employed PHQ2/ PHQ9 completed Solution-Focused Strategies employed:  Active listening / Reflection utilized  Emotional Support Provided Continue participation with community counselor Look for ways to reduce smoking, set practical goals (setting number of cigarettes in morning/noon/evening) Follow up with [place] quits Continue with self-care: arts, walking gentle yoga, friends group          SDOH assessments and interventions completed:  Yes  SDOH Interventions Today    Flowsheet Row Most Recent Value  SDOH Interventions   Stress Interventions Other (Comment)  [Pt is established with a community therapist - appt coming on 02/22/23]        Care Coordination Interventions:  Yes, provided   Interventions Today    Flowsheet Row Most Recent Value  Chronic Disease   Chronic disease during today's visit Other  [Depression and anxiety]  General Interventions   General Interventions Discussed/Reviewed General Interventions Discussed, [employer] has joined [place] quits for smoking cessation - waiting for follow up. Very active in church and with a friends group. Has dog who she walks regularly.]  Exercise Interventions   Exercise Discussed/Reviewed Exercise Discussed, Exercise Reviewed  [Pt walks dog and goes  for walks in the woods regularly for exercise and self care. Had heart atack on 12/20, has been referred to cardiac rehab program and is waiting for follow up.]  Mental Health Interventions   Mental Health Discussed/Reviewed Mental Health Discussed, Mental Health Reviewed, Coping Strategies, Anxiety, Depression  [Pt is established with a community therapist - next appt is 03/23/23. Lots fo stress from children - dtr requires financial support and pushes boundaries regularly. Pt is well versed  in coping skills and practices them regularly - yoga, arts, walking ect.]        Follow up plan: Follow up call scheduled for 03/05/22, 2PM    Encounter Outcome:  Patient Visit Completed

## 2023-02-22 ENCOUNTER — Encounter: Payer: Self-pay | Admitting: Cardiology

## 2023-02-23 ENCOUNTER — Encounter (HOSPITAL_COMMUNITY): Payer: Self-pay

## 2023-02-24 ENCOUNTER — Telehealth (HOSPITAL_COMMUNITY): Payer: Self-pay

## 2023-02-25 ENCOUNTER — Encounter (HOSPITAL_COMMUNITY)
Admission: RE | Admit: 2023-02-25 | Discharge: 2023-02-25 | Disposition: A | Discharge status: Home / Self Care | Payer: PPO | Source: Ambulatory Visit | Attending: Cardiology | Admitting: Cardiology

## 2023-02-25 ENCOUNTER — Encounter (HOSPITAL_COMMUNITY): Payer: Self-pay

## 2023-02-25 VITALS — BP 98/64 | HR 50 | Ht 64.0 in | Wt 128.5 lb

## 2023-02-25 DIAGNOSIS — I214 Non-ST elevation (NSTEMI) myocardial infarction: Secondary | ICD-10-CM | POA: Insufficient documentation

## 2023-02-25 DIAGNOSIS — M5412 Radiculopathy, cervical region: Secondary | ICD-10-CM | POA: Diagnosis not present

## 2023-02-25 DIAGNOSIS — Z7902 Long term (current) use of antithrombotics/antiplatelets: Secondary | ICD-10-CM | POA: Diagnosis not present

## 2023-02-25 DIAGNOSIS — F17210 Nicotine dependence, cigarettes, uncomplicated: Secondary | ICD-10-CM | POA: Insufficient documentation

## 2023-02-25 DIAGNOSIS — Z48812 Encounter for surgical aftercare following surgery on the circulatory system: Secondary | ICD-10-CM | POA: Insufficient documentation

## 2023-02-25 DIAGNOSIS — Z955 Presence of coronary angioplasty implant and graft: Secondary | ICD-10-CM | POA: Diagnosis not present

## 2023-02-25 NOTE — Progress Notes (Signed)
 Cardiac Rehab Medication Review   Does the patient  feel that his/her medications are working for him/her?  YES   Has the patient been experiencing any side effects to the medications prescribed?  YES   Does the patient measure his/her own blood pressure or blood glucose at home?  YES  Does the patient have any problems obtaining medications due to transportation or finances?   NO  Understanding of regimen: good Understanding of indications: good Potential of compliance: good    Comments: Amanda Kim understands her medications and regime well. She has a BP cuff but does not check her BP regularly. She has noticed increased fatigue lately.    [name], MS, ACSM-CEP 02/25/2023 11:20 AM

## 2023-02-25 NOTE — Progress Notes (Signed)
 Cardiac Individual Treatment Plan  Patient Details  Name: Amanda Kim MRN: [id number] Date of Birth: 1945-08-20 Referring Provider:   Flowsheet Row INTENSIVE CARDIAC REHAB ORIENT from 02/25/2023 in [facility]  Referring Provider Gordy Bergamo, MD       Initial Encounter Date:  Flowsheet Row INTENSIVE CARDIAC REHAB ORIENT from 02/25/2023 in [facility]  Date 02/25/23       Visit Diagnosis: 02/06/23 NSTEMI (non-ST elevated myocardial infarction) (HCC)  02/06/23 DES LAD and LCX  Patient's Home Medications on Admission:  Current Outpatient Medications:    amLODipine  (NORVASC ) 10 MG tablet, Take 10 mg by mouth daily., Disp: , Rfl:    Ascorbic Acid (VITAMIN C) 1000 MG tablet, Take 1,000 mg by mouth daily., Disp: , Rfl:    aspirin  EC 81 MG tablet, Take 1 tablet (81 mg total) by mouth daily. Swallow whole., Disp: 30 tablet, Rfl: 11   atorvastatin  (LIPITOR) 40 MG tablet, Take 1 tablet (40 mg total) by mouth daily., Disp: 30 tablet, Rfl: 5   BLACK CURRANT SEED OIL PO, Take 1 tablet by mouth daily., Disp: , Rfl:    Calcium  Citrate-Vitamin D  (CALCIUM  CITRATE+D3 PO), Take 1 tablet by mouth daily., Disp: , Rfl:    CALCIUM  PO, Take 1 tablet by mouth daily., Disp: , Rfl:    COLLAGEN PO, Take 1 Dose by mouth daily., Disp: , Rfl:    COLOSTRUM PO, Take 1 Dose by mouth 4 (four) times a week., Disp: , Rfl:    diclofenac  (VOLTAREN ) 50 MG EC tablet, Take 25 mg by mouth 2 (two) times daily., Disp: , Rfl:    DULoxetine  (CYMBALTA ) 20 MG capsule, Take 20 mg by mouth 2 (two) times daily., Disp: , Rfl:    Estradiol  (VAGIFEM ) 10 MCG TABS vaginal tablet, Place 10 mcg vaginally 2 (two) times a week., Disp: , Rfl:    gabapentin  (NEURONTIN ) 100 MG capsule, Take 200 mg by mouth 2 (two) times daily., Disp: , Rfl:    hydrOXYzine  (ATARAX ) 25 MG tablet, Take 25-75 mg by mouth daily as needed  (Sleep)., Disp: , Rfl:    ketoconazole (NIZORAL) 2 % shampoo, Apply 1 Application topically 3 (three) times a week., Disp: , Rfl:    levothyroxine  (SYNTHROID , LEVOTHROID) 100 MCG tablet, Take 100 mcg by mouth every morning., Disp: , Rfl: 0   losartan  (COZAAR ) 100 MG tablet, Take 100 mg by mouth daily., Disp: , Rfl:    metoprolol  succinate (TOPROL  XL) 25 MG 24 hr tablet, Take 1 tablet (25 mg total) by mouth daily., Disp: 90 tablet, Rfl: 3   nicotine  (NICODERM CQ  - DOSED IN MG/24 HOURS) 21 mg/24hr patch, Place 1 patch (21 mg total) onto the skin daily., Disp: 28 patch, Rfl: 0   nitroGLYCERIN  (NITROSTAT ) 0.4 MG SL tablet, Place 1 tablet (0.4 mg total) under the tongue every 5 (five) minutes as needed for chest pain., Disp: 25 tablet, Rfl: 3   OVER THE COUNTER MEDICATION, Take 750 mg by mouth daily. Green Lipped Mussels, Disp: , Rfl:    OVER THE COUNTER MEDICATION, Take 1 Dose by mouth at bedtime. Sound Asleep Gummy, Disp: , Rfl:    pantoprazole  (PROTONIX ) 40 MG tablet, Take 1 tablet (40 mg total) by mouth daily., Disp: 30 tablet, Rfl: 3   potassium chloride  SA (KLOR-CON  M) 20 MEQ tablet, Take 2 tablets (40 mEq total) by mouth daily., Disp: 60 tablet,  Rfl: 0   Sodium Hyaluronate, oral, (HYALURONIC ACID PO), Take 1 Dose by mouth daily., Disp: , Rfl:    ticagrelor  (BRILINTA ) 90 MG TABS tablet, Take 1 tablet (90 mg total) by mouth 2 (two) times daily., Disp: 60 tablet, Rfl: 11   valACYclovir  (VALTREX ) 1000 MG tablet, Take 1,000 mg by mouth 2 (two) times daily as needed (Flares)., Disp: , Rfl: 2   brompheniramine-pseudoephedrine-DM 30-2-10 MG/5ML syrup, Take 5 mLs by mouth 3 (three) times daily as needed (Cough). (Patient not taking: Reported on 02/25/2023), Disp: , Rfl:    oxyCODONE -acetaminophen  (PERCOCET/ROXICET) 5-325 MG tablet, Take 1 tablet by mouth at bedtime as needed for severe pain (pain score 7-10). (Patient not taking: Reported on 02/25/2023), Disp: , Rfl:   Past Medical History: Past Medical  History:  Diagnosis Date   Back pain    d/t horse fell on pt in the 80's   Bone spur    in neck(since 1990) and 1st 3 fingers on right hand go numb   Chronic pain    d/t horse accident in the 80's   Headache(784.0)    tension   History of colon polyps    Hyperlipidemia    takes Fish Oil and CoQ10   Hypertension    takes Inderal  daily   Hypothyroid 05/07/2011   takes Synthroid  daily   Leg cramps    takes Potassium prn   Liver disease    nonalcholic fatty liver disease   Nausea & vomiting    phenergan  and zofran  prn   Spondylolisthesis    Spondylosis of thoracolumbar region without myelopathy or radiculopathy    spine   Urinary frequency     Tobacco Use: Social History   Tobacco Use  Smoking Status Every Day   Current packs/day: 0.80   Average packs/day: 0.8 packs/day for 45.0 years (36.0 ttl pk-yrs)   Types: Cigarettes  Smokeless Tobacco Never  Tobacco Comments   Using quitline [facility] for cessation counseling, down to 6 cigarettes/day.     Labs: Review Flowsheet       Latest Ref Rng & Units 05/20/2011 09/11/2011 02/07/2023  Labs for ITP Cardiac and Pulmonary Rehab  Cholestrol 0 - 200 mg/dL 770  824  874   LDL (calc) 0 - 99 mg/dL 850  894  62   HDL-C >59 mg/dL 39  42  33   Trlycerides <150 mg/dL 794  861  848   Hemoglobin A1c - - 5.9  -    Capillary Blood Glucose: No results found for: GLUCAP   Exercise Target Goals: Exercise Program Goal: Individual exercise prescription set using results from initial 6 min walk test and THRR while considering  patient's activity barriers and safety.   Exercise Prescription Goal: Initial exercise prescription builds to 30-45 minutes a day of aerobic activity, 2-3 days per week.  Home exercise guidelines will be given to patient during program as part of exercise prescription that the participant will acknowledge.  Activity Barriers & Risk Stratification:  Activity Barriers & Cardiac Risk Stratification - 02/25/23 1122        Activity Barriers & Cardiac Risk Stratification   Activity Barriers Arthritis;Back Problems;Neck/Spine Problems;Balance Concerns;Shortness of Breath;Deconditioning;Joint Problems    Cardiac Risk Stratification High   <5 METs on            6 Minute Walk:  6 Minute Walk     Row Name 02/25/23 1331         6 Minute Walk   Phase Initial  Distance 1680 feet     Walk Time 6 minutes     # of Rest Breaks 0     MPH 3.18     METS 3.21     RPE 11     Perceived Dyspnea  0     VO2 Peak 11.24     Symptoms Yes (comment)     Comments Bilateral hip pain 4/10, resolved with rest     Resting HR 50 bpm     Resting BP 98/64     Resting Oxygen  Saturation  95 %     Exercise Oxygen  Saturation  during 6 min walk 95 %     Max Ex. HR 82 bpm     Max Ex. BP 118/66     2 Minute Post BP 104/64              Oxygen  Initial Assessment:   Oxygen  Re-Evaluation:   Oxygen  Discharge (Final Oxygen  Re-Evaluation):   Initial Exercise Prescription:  Initial Exercise Prescription - 02/25/23 1300       Date of Initial Exercise RX and Referring Provider   Date 02/25/23    Referring Provider Gordy Bergamo, MD    Expected Discharge Date 05/20/23      Treadmill   MPH 2.8    Grade 0    Minutes 15    METs 3.17      NuStep   Level 1    SPM 75    Minutes 15    METs 2.5      Prescription Details   Frequency (times per week) 3    Duration Progress to 30 minutes of continuous aerobic without signs/symptoms of physical distress      Intensity   THRR 40-80% of Max Heartrate 58-115    Ratings of Perceived Exertion 11-13    Perceived Dyspnea 0-4      Progression   Progression Continue progressive overload as per policy without signs/symptoms or physical distress.      Resistance Training   Training Prescription Yes    Weight 2    Reps 10-15             Perform Capillary Blood Glucose checks as needed.  Exercise Prescription Changes:   Exercise Comments:   Exercise  Goals and Review:   Exercise Goals     Row Name 02/25/23 1122             Exercise Goals   Increase Physical Activity Yes       Intervention Provide advice, education, support and counseling about physical activity/exercise needs.;Develop an individualized exercise prescription for aerobic and resistive training based on initial evaluation findings, risk stratification, comorbidities and participant's personal goals.       Expected Outcomes Short Term: Attend rehab on a regular basis to increase amount of physical activity.;Long Term: Exercising regularly at least 3-5 days a week.;Long Term: Add in home exercise to make exercise part of routine and to increase amount of physical activity.       Increase Strength and Stamina Yes       Intervention Provide advice, education, support and counseling about physical activity/exercise needs.;Develop an individualized exercise prescription for aerobic and resistive training based on initial evaluation findings, risk stratification, comorbidities and participant's personal goals.       Expected Outcomes Short Term: Increase workloads from initial exercise prescription for resistance, speed, and METs.;Short Term: Perform resistance training exercises routinely during rehab and add in resistance training at home;Long Term: Improve cardiorespiratory  fitness, muscular endurance and strength as measured by increased METs and functional capacity ( )       Able to understand and use rate of perceived exertion (RPE) scale Yes       Intervention Provide education and explanation on how to use RPE scale       Expected Outcomes Short Term: Able to use RPE daily in rehab to express subjective intensity level;Long Term:  Able to use RPE to guide intensity level when exercising independently       Knowledge and understanding of Target Heart Rate Range (THRR) Yes       Intervention Provide education and explanation of THRR including how the numbers were predicted  and where they are located for reference       Expected Outcomes Short Term: Able to state/look up THRR;Short Term: Able to use daily as guideline for intensity in rehab;Long Term: Able to use THRR to govern intensity when exercising independently       Understanding of Exercise Prescription Yes       Intervention Provide education, explanation, and written materials on patient's individual exercise prescription       Expected Outcomes Short Term: Able to explain program exercise prescription;Long Term: Able to explain home exercise prescription to exercise independently                Exercise Goals Re-Evaluation :   Discharge Exercise Prescription (Final Exercise Prescription Changes):   Nutrition:  Target Goals: Understanding of nutrition guidelines, daily intake of sodium 1500mg , cholesterol 200mg , calories 30% from fat and 7% or less from saturated fats, daily to have 5 or more servings of fruits and vegetables.  Biometrics:  Pre Biometrics - 02/25/23 1117       Pre Biometrics   Waist Circumference 34 inches    Hip Circumference 36.5 inches    Waist to Hip Ratio 0.93 %    Triceps Skinfold 18 mm    % Body Fat 34.1 %    Grip Strength 28 kg    Flexibility --   not done, low back pain   Single Leg Stand 26.4 seconds              Nutrition Therapy Plan and Nutrition Goals:   Nutrition Assessments:  MEDIFICTS Score Key: >=70 Need to make dietary changes  40-70 Heart Healthy Diet <= 40 Therapeutic Level Cholesterol Diet    Picture Your Plate Scores: <59 Unhealthy dietary pattern with much room for improvement. 41-50 Dietary pattern unlikely to meet recommendations for good health and room for improvement. 51-60 More healthful dietary pattern, with some room for improvement.  >60 Healthy dietary pattern, although there may be some specific behaviors that could be improved.    Nutrition Goals Re-Evaluation:   Nutrition Goals Re-Evaluation:   Nutrition  Goals Discharge (Final Nutrition Goals Re-Evaluation):   Psychosocial: Target Goals: Acknowledge presence or absence of significant depression and/or stress, maximize coping skills, provide positive support system. Participant is able to verbalize types and ability to use techniques and skills needed for reducing stress and depression.  Initial Review & Psychosocial Screening:  Initial Psych Review & Screening - 02/25/23 1126       Initial Review   Current issues with Current Depression;Current Anxiety/Panic;Current Sleep Concerns;Current Stress Concerns    Source of Stress Concerns Family    Comments Ronald shared that she has some current feelings of anxiety/depression/stress mostly related to family and her daughters health. Her daughter is very dependent of Yailine which has  caused strain on their relationship. Sia is currently seeing a counselor and denies any need for additional resources at this time.      Family Dynamics   Good Support System? Yes   Has friends for support     Barriers   Psychosocial barriers to participate in program The patient should benefit from training in stress management and relaxation.      Screening Interventions   Interventions Provide feedback about the scores to participant;To provide support and resources with identified psychosocial needs;Encouraged to exercise    Expected Outcomes Long Term goal: The participant improves quality of Life and PHQ9 Scores as seen by post scores and/or verbalization of changes;Short Term goal: Identification and review with participant of any Quality of Life or Depression concerns found by scoring the questionnaire.;Long Term Goal: Stressors or current issues are controlled or eliminated.;Short Term goal: Utilizing psychosocial counselor, staff and physician to assist with identification of specific Stressors or current issues interfering with healing process. Setting desired goal for each stressor or current issue  identified.             Quality of Life Scores:  Quality of Life - 02/25/23 1333       Quality of Life   Select Quality of Life      Quality of Life Scores   Health/Function Pre 11.37 %    Socioeconomic Pre 23.06 %    Psych/Spiritual Pre 20.93 %    Family Pre 6.5 %    GLOBAL Pre 15.51 %            Scores of 19 and below usually indicate a poorer quality of life in these areas.  A difference of  2-3 points is a clinically meaningful difference.  A difference of 2-3 points in the total score of the Quality of Life Index has been associated with significant improvement in overall quality of life, self-image, physical symptoms, and general health in studies assessing change in quality of life.  PHQ-9: Review Flowsheet  More data exists      02/25/2023 02/20/2023 09/23/2017 03/21/2014 08/31/2013  Depression screen PHQ 2/9  Decreased Interest 2 1 0 0 0  Down, Depressed, Hopeless 1 0 0 0 0  PHQ - 2 Score 3 1 0 0 0  Altered sleeping 1 - - 1 -  Tired, decreased energy 3 - - 1 -  Change in appetite 0 - - 0 -  Feeling bad or failure about yourself  0 - - 0 -  Trouble concentrating 0 - - 0 -  Moving slowly or fidgety/restless 0 - - 0 -  Suicidal thoughts 0 - - 0 -  PHQ-9 Score 7 - - 2 -  Difficult doing work/chores Somewhat difficult - - - -   Interpretation of Total Score  Total Score Depression Severity:  1-4 = Minimal depression, 5-9 = Mild depression, 10-14 = Moderate depression, 15-19 = Moderately severe depression, 20-27 = Severe depression   Psychosocial Evaluation and Intervention:   Psychosocial Re-Evaluation:   Psychosocial Discharge (Final Psychosocial Re-Evaluation):   Vocational Rehabilitation: Provide vocational rehab assistance to qualifying candidates.   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 Participant will state understanding/return demonstration of topics presented.     Core Videos: Exercise    Move It!  Clinical staff conducted group or individual video education with verbal and written material and guidebook.  Patient learns the recommended Pritikin exercise program. Exercise with the goal of living a long, healthy life. Some of the health benefits of exercise include controlled diabetes, healthier blood pressure levels, improved cholesterol levels, improved heart and lung capacity, improved sleep, and better body composition. Everyone should speak with their doctor before starting or changing an exercise routine.  Biomechanical Limitations Clinical staff conducted group or individual video education with verbal and written material and guidebook.  Patient learns how biomechanical limitations can impact exercise and how we can mitigate and possibly overcome limitations to have an impactful and balanced exercise routine.  Body Composition Clinical staff conducted group or individual video education with verbal and written material and guidebook.  Patient learns that body composition (ratio of muscle mass to fat mass) is a key component to assessing overall fitness, rather than body weight alone. Increased fat mass, especially visceral belly fat, can put us  at increased risk for metabolic syndrome, type 2 diabetes, heart disease, and even death. It is recommended to combine diet and exercise (cardiovascular and resistance training) to improve your body composition. Seek guidance from your physician and exercise physiologist before implementing an exercise routine.  Exercise Action Plan Clinical staff conducted group or individual video education with verbal and written material and guidebook.  Patient learns the recommended  strategies to achieve and enjoy long-term exercise adherence, including variety, self-motivation, self-efficacy, and positive decision making. Benefits of exercise include fitness, good health, weight management, more energy, better sleep, less stress, and overall well-being.  Medical   Heart Disease Risk Reduction Clinical staff conducted group or individual video education with verbal and written material and guidebook.  Patient learns our heart is our most vital organ as it circulates oxygen , nutrients, white blood cells, and hormones throughout the entire body, and carries waste away. Data supports a plant-based eating plan like the Pritikin Program for its effectiveness in slowing progression of and reversing heart disease. The video provides a number of recommendations to address heart disease.   Metabolic Syndrome and Belly Fat  Clinical staff conducted group or individual video education with verbal and written material and guidebook.  Patient learns what metabolic syndrome is, how it leads to heart disease, and how one can reverse it and keep it from coming back. You have metabolic syndrome if you have 3 of the following 5 criteria: abdominal obesity, high blood pressure, high triglycerides, low HDL cholesterol, and high blood sugar.  Hypertension and Heart Disease Clinical staff conducted group or individual video education with verbal and written material and guidebook.  Patient learns that high blood pressure, or hypertension, is very common in the [place] . Hypertension is largely due to excessive salt intake, but other important risk factors include being overweight, physical inactivity, drinking too much alcohol , smoking, and not eating enough potassium from fruits and vegetables. High blood pressure is a leading risk factor for heart attack, stroke, congestive heart failure, dementia, kidney failure, and premature death. Long-term effects of excessive salt intake include stiffening  of the arteries and thickening of heart muscle and organ damage. Recommendations include ways to reduce hypertension and the risk of heart disease.  Diseases of Our Time - Focusing on Diabetes Clinical staff conducted group or individual video education with verbal and written  material and guidebook.  Patient learns why the best way to stop diseases of our time is prevention, through food and other lifestyle changes. Medicine (such as prescription pills and surgeries) is often only a Band-Aid on the problem, not a long-term solution. Most common diseases of our time include obesity, type 2 diabetes, hypertension, heart disease, and cancer. The Pritikin Program is recommended and has been proven to help reduce, reverse, and/or prevent the damaging effects of metabolic syndrome.  Nutrition   Overview of the Pritikin Eating Plan  Clinical staff conducted group or individual video education with verbal and written material and guidebook.  Patient learns about the Pritikin Eating Plan for disease risk reduction. The Pritikin Eating Plan emphasizes a wide variety of unrefined, minimally-processed carbohydrates, like fruits, vegetables, whole grains, and legumes. Go, Caution, and Stop food choices are explained. Plant-based and lean animal proteins are emphasized. Rationale provided for low sodium intake for blood pressure control, low added sugars for blood sugar stabilization, and low added fats and oils for coronary artery disease risk reduction and weight management.  Calorie Density  Clinical staff conducted group or individual video education with verbal and written material and guidebook.  Patient learns about calorie density and how it impacts the Pritikin Eating Plan. Knowing the characteristics of the food you choose will help you decide whether those foods will lead to weight gain or weight loss, and whether you want to consume more or less of them. Weight loss is usually a side effect of the  Pritikin Eating Plan because of its focus on low calorie-dense foods.  Label Reading  Clinical staff conducted group or individual video education with verbal and written material and guidebook.  Patient learns about the Pritikin recommended label reading guidelines and corresponding recommendations regarding calorie density, added sugars, sodium content, and whole grains.  Dining Out - Part 1  Clinical staff conducted group or individual video education with verbal and written material and guidebook.  Patient learns that restaurant meals can be sabotaging because they can be so high in calories, fat, sodium, and/or sugar. Patient learns recommended strategies on how to positively address this and avoid unhealthy pitfalls.  Facts on Fats  Clinical staff conducted group or individual video education with verbal and written material and guidebook.  Patient learns that lifestyle modifications can be just as effective, if not more so, as many medications for lowering your risk of heart disease. A Pritikin lifestyle can help to reduce your risk of inflammation and atherosclerosis (cholesterol build-up, or plaque, in the artery walls). Lifestyle interventions such as dietary choices and physical activity address the cause of atherosclerosis. A review of the types of fats and their impact on blood cholesterol levels, along with dietary recommendations to reduce fat intake is also included.  Nutrition Action Plan  Clinical staff conducted group or individual video education with verbal and written material and guidebook.  Patient learns how to incorporate Pritikin recommendations into their lifestyle. Recommendations include planning and keeping personal health goals in mind as an important part of their success.  Healthy Mind-Set    Healthy Minds, Bodies, Hearts  Clinical staff conducted group or individual video education with verbal and written material and guidebook.  Patient learns how to identify  when they are stressed. Video will discuss the impact of that stress, as well as the many benefits of stress management. Patient will also be introduced to stress management techniques. The way we think, act, and feel has an impact on our hearts.  How Our Thoughts Can Heal Our Hearts  Clinical staff conducted group or individual video education with verbal and written material and guidebook.  Patient learns that negative thoughts can cause depression and anxiety. This can result in negative lifestyle behavior and serious health problems. Cognitive behavioral therapy is an effective method to help control our thoughts in order to change and improve our emotional outlook.  Additional Videos:  Exercise    Improving Performance  Clinical staff conducted group or individual video education with verbal and written material and guidebook.  Patient learns to use a non-linear approach by alternating intensity levels and lengths of time spent exercising to help burn more calories and lose more body fat. Cardiovascular exercise helps improve heart health, metabolism, hormonal balance, blood sugar control, and recovery from fatigue. Resistance training improves strength, endurance, balance, coordination, reaction time, metabolism, and muscle mass. Flexibility exercise improves circulation, posture, and balance. Seek guidance from your physician and exercise physiologist before implementing an exercise routine and learn your capabilities and proper form for all exercise.  Introduction to Yoga  Clinical staff conducted group or individual video education with verbal and written material and guidebook.  Patient learns about yoga, a discipline of the coming together of mind, breath, and body. The benefits of yoga include improved flexibility, improved range of motion, better posture and core strength, increased lung function, weight loss, and positive self-image. Yoga's heart health benefits include lowered blood  pressure, healthier heart rate, decreased cholesterol and triglyceride levels, improved immune function, and reduced stress. Seek guidance from your physician and exercise physiologist before implementing an exercise routine and learn your capabilities and proper form for all exercise.  Medical   Aging: Enhancing Your Quality of Life  Clinical staff conducted group or individual video education with verbal and written material and guidebook.  Patient learns key strategies and recommendations to stay in good physical health and enhance quality of life, such as prevention strategies, having an advocate, securing a Health Care Proxy and Power of Attorney, and keeping a list of medications and system for tracking them. It also discusses how to avoid risk for bone loss.  Biology of Weight Control  Clinical staff conducted group or individual video education with verbal and written material and guidebook.  Patient learns that weight gain occurs because we consume more calories than we burn (eating more, moving less). Even if your body weight is normal, you may have higher ratios of fat compared to muscle mass. Too much body fat puts you at increased risk for cardiovascular disease, heart attack, stroke, type 2 diabetes, and obesity-related cancers. In addition to exercise, following the Pritikin Eating Plan can help reduce your risk.  Decoding Lab Results  Clinical staff conducted group or individual video education with verbal and written material and guidebook.  Patient learns that lab test reflects one measurement whose values change over time and are influenced by many factors, including medication, stress, sleep, exercise, food, hydration, pre-existing medical conditions, and more. It is recommended to use the knowledge from this video to become more involved with your lab results and evaluate your numbers to speak with your doctor.   Diseases of Our Time - Overview  Clinical staff conducted group or  individual video education with verbal and written material and guidebook.  Patient learns that according to the CDC, 50% to 70% of chronic diseases (such as obesity, type 2 diabetes, elevated lipids, hypertension, and heart disease) are avoidable through lifestyle improvements including healthier food choices, listening to  satiety cues, and increased physical activity.  Sleep Disorders Clinical staff conducted group or individual video education with verbal and written material and guidebook.  Patient learns how good quality and duration of sleep are important to overall health and well-being. Patient also learns about sleep disorders and how they impact health along with recommendations to address them, including discussing with a physician.  Nutrition  Dining Out - Part 2 Clinical staff conducted group or individual video education with verbal and written material and guidebook.  Patient learns how to plan ahead and communicate in order to maximize their dining experience in a healthy and nutritious manner. Included are recommended food choices based on the type of restaurant the patient is visiting.   Fueling a Banker conducted group or individual video education with verbal and written material and guidebook.  There is a strong connection between our food choices and our health. Diseases like obesity and type 2 diabetes are very prevalent and are in large-part due to lifestyle choices. The Pritikin Eating Plan provides plenty of food and hunger-curbing satisfaction. It is easy to follow, affordable, and helps reduce health risks.  Menu Workshop  Clinical staff conducted group or individual video education with verbal and written material and guidebook.  Patient learns that restaurant meals can sabotage health goals because they are often packed with calories, fat, sodium, and sugar. Recommendations include strategies to plan ahead and to communicate with the manager,  chef, or server to help order a healthier meal.  Planning Your Eating Strategy  Clinical staff conducted group or individual video education with verbal and written material and guidebook.  Patient learns about the Pritikin Eating Plan and its benefit of reducing the risk of disease. The Pritikin Eating Plan does not focus on calories. Instead, it emphasizes high-quality, nutrient-rich foods. By knowing the characteristics of the foods, we choose, we can determine their calorie density and make informed decisions.  Targeting Your Nutrition Priorities  Clinical staff conducted group or individual video education with verbal and written material and guidebook.  Patient learns that lifestyle habits have a tremendous impact on disease risk and progression. This video provides eating and physical activity recommendations based on your personal health goals, such as reducing LDL cholesterol, losing weight, preventing or controlling type 2 diabetes, and reducing high blood pressure.  Vitamins and Minerals  Clinical staff conducted group or individual video education with verbal and written material and guidebook.  Patient learns different ways to obtain key vitamins and minerals, including through a recommended healthy diet. It is important to discuss all supplements you take with your doctor.   Healthy Mind-Set    Smoking Cessation  Clinical staff conducted group or individual video education with verbal and written material and guidebook.  Patient learns that cigarette smoking and tobacco addiction pose a serious health risk which affects millions of people. Stopping smoking will significantly reduce the risk of heart disease, lung disease, and many forms of cancer. Recommended strategies for quitting are covered, including working with your doctor to develop a successful plan.  Culinary   Becoming a Set Designer conducted group or individual video education with verbal and written  material and guidebook.  Patient learns that cooking at home can be healthy, cost-effective, quick, and puts them in control. Keys to cooking healthy recipes will include looking at your recipe, assessing your equipment needs, planning ahead, making it simple, choosing cost-effective seasonal ingredients, and limiting the use of added fats, salts,  and sugars.  Cooking - Breakfast and Snacks  Clinical staff conducted group or individual video education with verbal and written material and guidebook.  Patient learns how important breakfast is to satiety and nutrition through the entire day. Recommendations include key foods to eat during breakfast to help stabilize blood sugar levels and to prevent overeating at meals later in the day. Planning ahead is also a key component.  Cooking - Educational Psychologist conducted group or individual video education with verbal and written material and guidebook.  Patient learns eating strategies to improve overall health, including an approach to cook more at home. Recommendations include thinking of animal protein as a side on your plate rather than center stage and focusing instead on lower calorie dense options like vegetables, fruits, whole grains, and plant-based proteins, such as beans. Making sauces in large quantities to freeze for later and leaving the skin on your vegetables are also recommended to maximize your experience.  Cooking - Healthy Salads and Dressing Clinical staff conducted group or individual video education with verbal and written material and guidebook.  Patient learns that vegetables, fruits, whole grains, and legumes are the foundations of the Pritikin Eating Plan. Recommendations include how to incorporate each of these in flavorful and healthy salads, and how to create homemade salad dressings. Proper handling of ingredients is also covered. Cooking - Soups and [employer] - Soups and Desserts Clinical staff conducted  group or individual video education with verbal and written material and guidebook.  Patient learns that Pritikin soups and desserts make for easy, nutritious, and delicious snacks and meal components that are low in sodium, fat, sugar, and calorie density, while high in vitamins, minerals, and filling fiber. Recommendations include simple and healthy ideas for soups and desserts.   Overview     The Pritikin Solution Program Overview Clinical staff conducted group or individual video education with verbal and written material and guidebook.  Patient learns that the results of the Pritikin Program have been documented in more than 100 articles published in peer-reviewed journals, and the benefits include reducing risk factors for (and, in some cases, even reversing) high cholesterol, high blood pressure, type 2 diabetes, obesity, and more! An overview of the three key pillars of the Pritikin Program will be covered: eating well, doing regular exercise, and having a healthy mind-set.  WORKSHOPS  Exercise: Exercise Basics: Building Your Action Plan Clinical staff led group instruction and group discussion with PowerPoint presentation and patient guidebook. To enhance the learning environment the use of posters, models and videos may be added. At the conclusion of this workshop, patients will comprehend the difference between physical activity and exercise, as well as the benefits of incorporating both, into their routine. Patients will understand the FITT (Frequency, Intensity, Time, and Type) principle and how to use it to build an exercise action plan. In addition, safety concerns and other considerations for exercise and cardiac rehab will be addressed by the presenter. The purpose of this lesson is to promote a comprehensive and effective weekly exercise routine in order to improve patients' overall level of fitness.   Managing Heart Disease: Your Path to a Healthier Heart Clinical staff led  group instruction and group discussion with PowerPoint presentation and patient guidebook. To enhance the learning environment the use of posters, models and videos may be added.At the conclusion of this workshop, patients will understand the anatomy and physiology of the heart. Additionally, they will understand how Pritikin's three pillars impact  the risk factors, the progression, and the management of heart disease.  The purpose of this lesson is to provide a high-level overview of the heart, heart disease, and how the Pritikin lifestyle positively impacts risk factors.  Exercise Biomechanics Clinical staff led group instruction and group discussion with PowerPoint presentation and patient guidebook. To enhance the learning environment the use of posters, models and videos may be added. Patients will learn how the structural parts of their bodies function and how these functions impact their daily activities, movement, and exercise. Patients will learn how to promote a neutral spine, learn how to manage pain, and identify ways to improve their physical movement in order to promote healthy living. The purpose of this lesson is to expose patients to common physical limitations that impact physical activity. Participants will learn practical ways to adapt and manage aches and pains, and to minimize their effect on regular exercise. Patients will learn how to maintain good posture while sitting, walking, and lifting.  Balance Training and Fall Prevention  Clinical staff led group instruction and group discussion with PowerPoint presentation and patient guidebook. To enhance the learning environment the use of posters, models and videos may be added. At the conclusion of this workshop, patients will understand the importance of their sensorimotor skills (vision, proprioception, and the vestibular system) in maintaining their ability to balance as they age. Patients will apply a variety of balancing  exercises that are appropriate for their current level of function. Patients will understand the common causes for poor balance, possible solutions to these problems, and ways to modify their physical environment in order to minimize their fall risk. The purpose of this lesson is to teach patients about the importance of maintaining balance as they age and ways to minimize their risk of falling.  WORKSHOPS   Nutrition:  Fueling a Ship Broker led group instruction and group discussion with PowerPoint presentation and patient guidebook. To enhance the learning environment the use of posters, models and videos may be added. Patients will review the foundational principles of the Pritikin Eating Plan and understand what constitutes a serving size in each of the food groups. Patients will also learn Pritikin-friendly foods that are better choices when away from home and review make-ahead meal and snack options. Calorie density will be reviewed and applied to three nutrition priorities: weight maintenance, weight loss, and weight gain. The purpose of this lesson is to reinforce (in a group setting) the key concepts around what patients are recommended to eat and how to apply these guidelines when away from home by planning and selecting Pritikin-friendly options. Patients will understand how calorie density may be adjusted for different weight management goals.  Mindful Eating  Clinical staff led group instruction and group discussion with PowerPoint presentation and patient guidebook. To enhance the learning environment the use of posters, models and videos may be added. Patients will briefly review the concepts of the Pritikin Eating Plan and the importance of low-calorie dense foods. The concept of mindful eating will be introduced as well as the importance of paying attention to internal hunger signals. Triggers for non-hunger eating and techniques for dealing with triggers will be explored.  The purpose of this lesson is to provide patients with the opportunity to review the basic principles of the Pritikin Eating Plan, discuss the value of eating mindfully and how to measure internal cues of hunger and fullness using the Hunger Scale. Patients will also discuss reasons for non-hunger eating and learn strategies to  use for controlling emotional eating.  Targeting Your Nutrition Priorities Clinical staff led group instruction and group discussion with PowerPoint presentation and patient guidebook. To enhance the learning environment the use of posters, models and videos may be added. Patients will learn how to determine their genetic susceptibility to disease by reviewing their family history. Patients will gain insight into the importance of diet as part of an overall healthy lifestyle in mitigating the impact of genetics and other environmental insults. The purpose of this lesson is to provide patients with the opportunity to assess their personal nutrition priorities by looking at their family history, their own health history and current risk factors. Patients will also be able to discuss ways of prioritizing and modifying the Pritikin Eating Plan for their highest risk areas  Menu  Clinical staff led group instruction and group discussion with PowerPoint presentation and patient guidebook. To enhance the learning environment the use of posters, models and videos may be added. Using menus brought in from [employer], or printed from [employer], patients will apply the Pritikin dining out guidelines that were presented in the [employer] video. Patients will also be able to practice these guidelines in a variety of provided scenarios. The purpose of this lesson is to provide patients with the opportunity to practice hands-on learning of the Pritikin Dining Out guidelines with actual menus and practice scenarios.  Label Reading Clinical staff led group instruction  and group discussion with PowerPoint presentation and patient guidebook. To enhance the learning environment the use of posters, models and videos may be added. Patients will review and discuss the Pritikin label reading guidelines presented in Pritikin's Label Reading Educational series video. Using fool labels brought in from local grocery stores and markets, patients will apply the label reading guidelines and determine if the packaged food meet the Pritikin guidelines. The purpose of this lesson is to provide patients with the opportunity to review, discuss, and practice hands-on learning of the Pritikin Label Reading guidelines with actual packaged food labels. Cooking School  Pritikin's [employer] are designed to teach patients ways to prepare quick, simple, and affordable recipes at home. The importance of nutrition's role in chronic disease risk reduction is reflected in its emphasis in the overall Pritikin program. By learning how to prepare essential core Pritikin Eating Plan recipes, patients will increase control over what they eat; be able to customize the flavor of foods without the use of added salt, sugar, or fat; and improve the quality of the food they consume. By learning a set of core recipes which are easily assembled, quickly prepared, and affordable, patients are more likely to prepare more healthy foods at home. These workshops focus on convenient breakfasts, simple entres, side dishes, and desserts which can be prepared with minimal effort and are consistent with nutrition recommendations for cardiovascular risk reduction. Cooking [employer] are taught by a armed forces logistics/support/administrative officer (RD) who has been trained by the [employer]. The chef or RD has a clear understanding of the importance of minimizing - if not completely eliminating - added fat, sugar, and sodium in recipes. Throughout the series of Cooking School Workshop sessions, patients will learn  about healthy ingredients and efficient methods of cooking to build confidence in their capability to prepare    Cooking School weekly topics:  Adding Flavor- Sodium-Free  Fast and Healthy Breakfasts  Powerhouse Plant-Based Proteins  Satisfying Salads and Dressings  Simple Sides and Sauces  International Cuisine-Spotlight on  the Blue Zones  Delicious Desserts  Savory Soups  Efficiency Cooking - Meals in a Snap  Tasty Appetizers and Snacks  Comforting Weekend Breakfasts  One-Pot Wonders   Fast [employer] Your Pritikin Plate  WORKSHOPS   Healthy Mindset (Psychosocial):  Focused Goals, Sustainable Changes Clinical staff led group instruction and group discussion with PowerPoint presentation and patient guidebook. To enhance the learning environment the use of posters, models and videos may be added. Patients will be able to apply effective goal setting strategies to establish at least one personal goal, and then take consistent, meaningful action toward that goal. They will learn to identify common barriers to achieving personal goals and develop strategies to overcome them. Patients will also gain an understanding of how our mind-set can impact our ability to achieve goals and the importance of cultivating a positive and growth-oriented mind-set. The purpose of this lesson is to provide patients with a deeper understanding of how to set and achieve personal goals, as well as the tools and strategies needed to overcome common obstacles which may arise along the way.  From Head to Heart: The Power of a Healthy Outlook  Clinical staff led group instruction and group discussion with PowerPoint presentation and patient guidebook. To enhance the learning environment the use of posters, models and videos may be added. Patients will be able to recognize and describe the impact of emotions and mood on physical health. They will discover the importance of  self-care and explore self-care practices which may work for them. Patients will also learn how to utilize the 4 C's to cultivate a healthier outlook and better manage stress and challenges. The purpose of this lesson is to demonstrate to patients how a healthy outlook is an essential part of maintaining good health, especially as they continue their cardiac rehab journey.  Healthy Sleep for a Healthy Heart Clinical staff led group instruction and group discussion with PowerPoint presentation and patient guidebook. To enhance the learning environment the use of posters, models and videos may be added. At the conclusion of this workshop, patients will be able to demonstrate knowledge of the importance of sleep to overall health, well-being, and quality of life. They will understand the symptoms of, and treatments for, common sleep disorders. Patients will also be able to identify daytime and nighttime behaviors which impact sleep, and they will be able to apply these tools to help manage sleep-related challenges. The purpose of this lesson is to provide patients with a general overview of sleep and outline the importance of quality sleep. Patients will learn about a few of the most common sleep disorders. Patients will also be introduced to the concept of "sleep hygiene," and discover ways to self-manage certain sleeping problems through simple daily behavior changes. Finally, the workshop will motivate patients by clarifying the links between quality sleep and their goals of heart-healthy living.   Recognizing and Reducing Stress Clinical staff led group instruction and group discussion with PowerPoint presentation and patient guidebook. To enhance the learning environment the use of posters, models and videos may be added. At the conclusion of this workshop, patients will be able to understand the types of stress reactions, differentiate between acute and chronic stress, and recognize the impact that chronic  stress has on their health. They will also be able to apply different coping mechanisms, such as reframing negative self-talk. Patients will have the opportunity to practice a variety of stress management techniques, such as deep abdominal breathing,  progressive muscle relaxation, and/or guided imagery.  The purpose of this lesson is to educate patients on the role of stress in their lives and to provide healthy techniques for coping with it.  Learning Barriers/Preferences:  Learning Barriers/Preferences - 02/25/23 1129       Learning Barriers/Preferences   Learning Barriers Sight   glasses   Learning Preferences Group Instruction;Individual Instruction;Skilled Demonstration;Verbal Instruction;Written Material;Video             Education Topics:  Knowledge Questionnaire Score:  Knowledge Questionnaire Score - 02/25/23 1129       Knowledge Questionnaire Score   Pre Score 28/28             Core Components/Risk Factors/Patient Goals at Admission:  Personal Goals and Risk Factors at Admission - 02/25/23 1130       Core Components/Risk Factors/Patient Goals on Admission    Weight Management Yes;Weight Maintenance    Intervention Weight Management: Develop a combined nutrition and exercise program designed to reach desired caloric intake, while maintaining appropriate intake of nutrient and fiber, sodium and fats, and appropriate energy expenditure required for the weight goal.;Weight Management: Provide education and appropriate resources to help participant work on and attain dietary goals.    Expected Outcomes Short Term: Continue to assess and modify interventions until short term weight is achieved;Long Term: Adherence to nutrition and physical activity/exercise program aimed toward attainment of established weight goal;Weight Maintenance: Understanding of the daily nutrition guidelines, which includes 25-35% calories from fat, 7% or less cal from saturated fats, less than 200mg   cholesterol, less than 1.5gm of sodium, & 5 or more servings of fruits and vegetables daily;Understanding recommendations for meals to include 15-35% energy as protein, 25-35% energy from fat, 35-60% energy from carbohydrates, less than 200mg  of dietary cholesterol, 20-35 gm of total fiber daily;Understanding of distribution of calorie intake throughout the day with the consumption of 4-5 meals/snacks    Tobacco Cessation Yes    Number of packs per day 6 cigarettes/day    Intervention Assist the participant in steps to quit. Provide individualized education and counseling about committing to Tobacco Cessation, relapse prevention, and pharmacological support that can be provided by physician.;Education officer, environmental, assist with locating and accessing local/national Quit Smoking programs, and support quit date choice.    Expected Outcomes Short Term: Will demonstrate readiness to quit, by selecting a quit date.;Long Term: Complete abstinence from all tobacco products for at least 12 months from quit date.;Short Term: Will quit all tobacco product use, adhering to prevention of relapse plan.    Hypertension Yes    Intervention Provide education on lifestyle modifcations including regular physical activity/exercise, weight management, moderate sodium restriction and increased consumption of fresh fruit, vegetables, and low fat dairy, alcohol  moderation, and smoking cessation.;Monitor prescription use compliance.    Expected Outcomes Short Term: Continued assessment and intervention until BP is < 140/3mm HG in hypertensive participants. < 130/77mm HG in hypertensive participants with diabetes, heart failure or chronic kidney disease.;Long Term: Maintenance of blood pressure at goal levels.    Lipids Yes    Intervention Provide education and support for participant on nutrition & aerobic/resistive exercise along with prescribed medications to achieve LDL 70mg , HDL >40mg .    Expected Outcomes Short Term:  Participant states understanding of desired cholesterol values and is compliant with medications prescribed. Participant is following exercise prescription and nutrition guidelines.;Long Term: Cholesterol controlled with medications as prescribed, with individualized exercise RX and with personalized nutrition plan. Value goals: LDL < 70mg , HDL >  40 mg.    Stress Yes    Intervention Offer individual and/or small group education and counseling on adjustment to heart disease, stress management and health-related lifestyle change. Teach and support self-help strategies.;Refer participants experiencing significant psychosocial distress to appropriate mental health specialists for further evaluation and treatment. When possible, include family members and significant others in education/counseling sessions.    Expected Outcomes Short Term: Participant demonstrates changes in health-related behavior, relaxation and other stress management skills, ability to obtain effective social support, and compliance with psychotropic medications if prescribed.;Long Term: Emotional wellbeing is indicated by absence of clinically significant psychosocial distress or social isolation.             Core Components/Risk Factors/Patient Goals Review:    Core Components/Risk Factors/Patient Goals at Discharge (Final Review):    ITP Comments:  ITP Comments     Row Name 02/25/23 1117           ITP Comments Dr. Wilbert Bihari medical director. Introduction to [facility] education/ intensive cardiac rehab. Initial orientation packet reviewed with patient.                Comments: Participant attended orientation for the cardiac rehabilitation program on  02/25/2023  to perform initial intake and exercise walk test. Patient introduced to the [facility] Program education and orientation packet was reviewed. Completed 6-minute walk test, measurements, initial ITP, and exercise prescription. Vital signs stable.  Telemetry-normal sinus rhythm sinus brady, asymptomatic.   Service time was from 1028 to 1230.   [name], MS 02/25/2023 1:34 PM

## 2023-03-02 ENCOUNTER — Other Ambulatory Visit (HOSPITAL_COMMUNITY): Payer: Self-pay

## 2023-03-02 ENCOUNTER — Encounter (HOSPITAL_COMMUNITY): Payer: PPO

## 2023-03-02 ENCOUNTER — Encounter: Payer: Self-pay | Admitting: Cardiology

## 2023-03-02 ENCOUNTER — Other Ambulatory Visit: Payer: Self-pay | Admitting: Physician Assistant

## 2023-03-02 DIAGNOSIS — I10 Essential (primary) hypertension: Secondary | ICD-10-CM | POA: Diagnosis not present

## 2023-03-02 DIAGNOSIS — I25118 Atherosclerotic heart disease of native coronary artery with other forms of angina pectoris: Secondary | ICD-10-CM

## 2023-03-02 DIAGNOSIS — E1169 Type 2 diabetes mellitus with other specified complication: Secondary | ICD-10-CM | POA: Diagnosis not present

## 2023-03-02 MED ORDER — POTASSIUM CHLORIDE CRYS ER 20 MEQ PO TBCR
40.0000 meq | EXTENDED_RELEASE_TABLET | Freq: Every day | ORAL | 6 refills | Status: DC
  Filled 2023-03-02: qty 60, 30d supply, fill #0

## 2023-03-02 MED ORDER — HYDROXYZINE HCL 25 MG PO TABS
25.0000 mg | ORAL_TABLET | Freq: Every day | ORAL | 3 refills | Status: DC | PRN
  Filled 2023-03-02 – 2023-05-12 (×3): qty 90, 30d supply, fill #0

## 2023-03-02 MED ORDER — BUPROPION HCL ER (SR) 150 MG PO TB12
ORAL_TABLET | ORAL | 6 refills | Status: DC
  Filled 2023-03-02: qty 60, 30d supply, fill #0

## 2023-03-02 MED ORDER — ESTRADIOL 10 MCG VA TABS
1.0000 | ORAL_TABLET | VAGINAL | 3 refills | Status: DC
  Filled 2023-03-02: qty 24, 84d supply, fill #0

## 2023-03-02 MED ORDER — LEVOTHYROXINE SODIUM 100 MCG PO TABS
100.0000 ug | ORAL_TABLET | Freq: Every day | ORAL | 0 refills | Status: DC
  Filled 2023-03-02: qty 90, 90d supply, fill #0

## 2023-03-02 MED ORDER — AMLODIPINE BESYLATE 10 MG PO TABS
10.0000 mg | ORAL_TABLET | Freq: Every day | ORAL | 3 refills | Status: DC
  Filled 2023-03-02 – 2023-05-12 (×4): qty 90, 90d supply, fill #0
  Filled 2023-10-26: qty 90, 90d supply, fill #1
  Filled 2024-01-18: qty 90, 90d supply, fill #2

## 2023-03-02 MED ORDER — ATORVASTATIN CALCIUM 10 MG PO TABS
10.0000 mg | ORAL_TABLET | Freq: Every day | ORAL | 3 refills | Status: DC
  Filled 2023-03-02: qty 90, 90d supply, fill #0

## 2023-03-02 MED ORDER — DULOXETINE HCL 20 MG PO CPEP
40.0000 mg | ORAL_CAPSULE | Freq: Every day | ORAL | 1 refills | Status: DC
  Filled 2023-03-02: qty 180, 90d supply, fill #0

## 2023-03-02 MED ORDER — LOSARTAN POTASSIUM 100 MG PO TABS
100.0000 mg | ORAL_TABLET | Freq: Every evening | ORAL | 0 refills | Status: DC
  Filled 2023-03-02: qty 90, 90d supply, fill #0

## 2023-03-04 ENCOUNTER — Encounter (HOSPITAL_COMMUNITY)
Admission: RE | Admit: 2023-03-04 | Discharge: 2023-03-04 | Disposition: A | Discharge status: Home / Self Care | Payer: PPO | Source: Ambulatory Visit | Attending: Cardiology

## 2023-03-04 ENCOUNTER — Encounter (HOSPITAL_COMMUNITY): Payer: PPO

## 2023-03-04 DIAGNOSIS — Z48812 Encounter for surgical aftercare following surgery on the circulatory system: Secondary | ICD-10-CM | POA: Diagnosis not present

## 2023-03-04 DIAGNOSIS — I214 Non-ST elevation (NSTEMI) myocardial infarction: Secondary | ICD-10-CM

## 2023-03-04 DIAGNOSIS — Z955 Presence of coronary angioplasty implant and graft: Secondary | ICD-10-CM

## 2023-03-04 NOTE — Progress Notes (Signed)
 Daily Session Note  Patient Details  Name: Amanda Kim MRN: [id number] Date of Birth: May 18, 1945 Referring Provider:   Flowsheet Row INTENSIVE CARDIAC REHAB ORIENT from 02/25/2023 in [facility]  Referring Provider Knox Perl, MD       Encounter Date: 03/04/2023  Check In:  Session Check In - 03/04/23 1417       Check-In   Supervising physician immediately available to respond to emergencies [facility] - Physician supervision    Physician(s) Friddie Jetty, NP    Location [facility]    Staff Present France Ina, MS, Exercise Physiologist;Johnny Alexia Angelucci, MS, Exercise Physiologist;Jetta Otho Blitz BS, ACSM-CEP, Exercise Physiologist;Olinty Gaylene Kays, MS, ACSM-CEP, Exercise Physiologist;David Makemson, MS, ACSM-CEP, CCRP, Exercise Physiologist;Azul Coffie, RN, BSN    Virtual Visit No    Medication changes reported     No    Fall or balance concerns reported    No    Tobacco Cessation No Change    Warm-up and Cool-down Performed as group-led instruction    Resistance Training Performed No    VAD Patient? No    PAD/SET Patient? No      Pain Assessment   Currently in Pain? No/denies    Pain Score 0-No pain    Multiple Pain Sites No             Capillary Blood Glucose: No results found for this or any previous visit (from the past 24 hours).   Exercise Prescription Changes - 03/04/23 1621       Response to Exercise   Blood Pressure (Admit) 120/58    Blood Pressure (Exercise) 132/74    Blood Pressure (Exit) 98/54    Heart Rate (Admit) 55 bpm    Heart Rate (Exercise) 110 bpm    Heart Rate (Exit) 63 bpm    Rating of Perceived Exertion (Exercise) 12    Perceived Dyspnea (Exercise) 0    Symptoms 0    Comments Pt first day in the [facility] ICR program    Duration Progress to 30 minutes of  aerobic without signs/symptoms of physical distress    Intensity THRR unchanged      Progression    Progression Continue to progress workloads to maintain intensity without signs/symptoms of physical distress.    Average METs 2.27      Resistance Training   Training Prescription No    Weight 2    Reps 10-15    Time 10 Minutes      Treadmill   MPH 2.8    Grade 0    Minutes 15    METs 3.14      NuStep   Level 1    SPM 51    Minutes 15    METs 1.4             Social History   Tobacco Use  Smoking Status Every Day   Current packs/day: 0.80   Average packs/day: 0.8 packs/day for 45.0 years (36.0 ttl pk-yrs)   Types: Cigarettes  Smokeless Tobacco Never  Tobacco Comments   Using quitline [place] for cessation counseling, down to 6 cigarettes/day.     Goals Met:  Exercise tolerated well No report of concerns or symptoms today  Goals Unmet:  Not Applicable  Comments:Pt started cardiac rehab today.  Pt tolerated light exercise without difficulty. VSS, telemetry-Sinus Rhythm, asymptomatic.  Medication list reconciled. Pt denies barriers to medicaiton compliance.  PSYCHOSOCIAL ASSESSMENT:  PHQ-7. See quality of life  note.    Pt enjoys gardening, spending time outdoors, bird watching, playing with the dog.   Pt oriented to exercise equipment and routine.    Understanding verbalized.Monte Antonio RN BSN    Dr. Gaylyn Keas is Medical Director for Cardiac Rehab at [facility].

## 2023-03-04 NOTE — Progress Notes (Signed)
QUALITY OF LIFE SCORE REVIEW  Pt completed Quality of Life survey as a participant in Cardiac Rehab.  Scores 21.0 or below are considered low.  Pt score very low in several areas Overall 15.51, Health and Function 11.37, socioeconomic 23.06, physiological and spiritual 20.93, family 6.50. Patient quality of life slightly altered by physical constraints which limits ability to perform as prior to recent cardiac illness. Amanda Kim admits to being slightly dissatisfied with her health due to her multiple health issues. Amanda Kim has not had any chest pain recently. Amanda Kim did say that she is experiencing some intermittent shortness of breath. I recommended that Amanda Kim drink a carbonated beverage when she takes her Brillinta to see if that helps improve her symptoms. Amanda Kim also says she is experiencing some continued fatigue. Amanda Kim did well with her first day of exercise. Amanda Kim is hopeful that participating in cardiac rehab will improve her stamina.  Offered emotional support and reassurance.  Will continue to monitor and intervene as necessary. Amanda Kim says that both her adult children are on disability. Amanda Kim helps them as she can this sometimes is challenging for her. Amanda Kim does admit to experiencing some depression. Amanda Kim says she is taking Cymbalta for fibromyalgia. Amanda Kim says she has an appointment with her primary care Dr Nicholos Johns on Monday. Will forward quality of life questionnaire review as requested. Amanda Kim says she is receiving therapy biweekly.Thayer Headings RN BSN

## 2023-03-05 ENCOUNTER — Encounter (HOSPITAL_COMMUNITY): Payer: Self-pay

## 2023-03-06 ENCOUNTER — Encounter (HOSPITAL_COMMUNITY): Payer: PPO

## 2023-03-06 ENCOUNTER — Other Ambulatory Visit (HOSPITAL_COMMUNITY): Payer: Self-pay

## 2023-03-06 ENCOUNTER — Ambulatory Visit: Payer: Self-pay | Admitting: Licensed Clinical Social Worker

## 2023-03-06 NOTE — Patient Outreach (Signed)
  Care Coordination   Follow Up Visit Note   03/06/2023 Name: Joshlynn Moos MRN: [id number] DOB: May 20, 1945  Gabrial Kostick is a 78 y.o. year old female who sees Georgianne Fick, MD for primary care. I spoke with  Maylon Cos Montellano by phone today.  What matters to the patients health and wellness today?  To work towards quitting smoking.    Goals Addressed   None     SDOH assessments and interventions completed:  Yes     Care Coordination Interventions:  Yes, provided   Follow up plan: Follow up call scheduled for 04/06/2023    Encounter Outcome:  Patient Visit Completed   Kenton Kingfisher, LCSW /[facility] Licensed Clinical Social Worker Care Coordinator [phone]

## 2023-03-06 NOTE — Patient Instructions (Signed)
Visit Information  Thank you for taking time to visit with me today. Please don't hesitate to contact me if I can be of assistance to you.   Following are the goals we discussed today:   Goals Addressed   None     Our next appointment is by telephone on 04/06/23.  Please call the care guide team at [phone] if you need to cancel or reschedule your appointment.   If you are experiencing a Mental Health or Behavioral Health Crisis or need someone to talk to, please call the Suicide and Crisis Lifeline: 988  Patient verbalizes understanding of instructions and care plan provided today and agrees to view in MyChart. Active MyChart status and patient understanding of how to access instructions and care plan via MyChart confirmed with patient.     Telephone follow up appointment with care management team member scheduled for: 04/06/2023

## 2023-03-09 ENCOUNTER — Encounter (HOSPITAL_COMMUNITY): Payer: PPO

## 2023-03-09 DIAGNOSIS — E1169 Type 2 diabetes mellitus with other specified complication: Secondary | ICD-10-CM | POA: Diagnosis not present

## 2023-03-09 DIAGNOSIS — G4762 Sleep related leg cramps: Secondary | ICD-10-CM | POA: Diagnosis not present

## 2023-03-09 DIAGNOSIS — E039 Hypothyroidism, unspecified: Secondary | ICD-10-CM | POA: Diagnosis not present

## 2023-03-09 DIAGNOSIS — I10 Essential (primary) hypertension: Secondary | ICD-10-CM | POA: Diagnosis not present

## 2023-03-09 DIAGNOSIS — J432 Centrilobular emphysema: Secondary | ICD-10-CM | POA: Diagnosis not present

## 2023-03-09 DIAGNOSIS — D692 Other nonthrombocytopenic purpura: Secondary | ICD-10-CM | POA: Diagnosis not present

## 2023-03-09 DIAGNOSIS — I700 Atherosclerosis of aorta: Secondary | ICD-10-CM | POA: Diagnosis not present

## 2023-03-09 DIAGNOSIS — E782 Mixed hyperlipidemia: Secondary | ICD-10-CM | POA: Diagnosis not present

## 2023-03-09 DIAGNOSIS — F411 Generalized anxiety disorder: Secondary | ICD-10-CM | POA: Diagnosis not present

## 2023-03-09 DIAGNOSIS — I2510 Atherosclerotic heart disease of native coronary artery without angina pectoris: Secondary | ICD-10-CM | POA: Diagnosis not present

## 2023-03-10 ENCOUNTER — Other Ambulatory Visit: Payer: Self-pay | Admitting: Internal Medicine

## 2023-03-10 DIAGNOSIS — F17210 Nicotine dependence, cigarettes, uncomplicated: Secondary | ICD-10-CM

## 2023-03-11 ENCOUNTER — Encounter (HOSPITAL_COMMUNITY)
Admission: RE | Admit: 2023-03-11 | Discharge: 2023-03-11 | Disposition: A | Discharge status: Home / Self Care | Payer: PPO | Source: Ambulatory Visit | Attending: Cardiology | Admitting: Cardiology

## 2023-03-11 ENCOUNTER — Encounter (HOSPITAL_COMMUNITY): Payer: PPO

## 2023-03-11 DIAGNOSIS — Z955 Presence of coronary angioplasty implant and graft: Secondary | ICD-10-CM

## 2023-03-11 DIAGNOSIS — I214 Non-ST elevation (NSTEMI) myocardial infarction: Secondary | ICD-10-CM

## 2023-03-11 DIAGNOSIS — Z48812 Encounter for surgical aftercare following surgery on the circulatory system: Secondary | ICD-10-CM | POA: Diagnosis not present

## 2023-03-12 ENCOUNTER — Other Ambulatory Visit: Payer: Self-pay

## 2023-03-12 ENCOUNTER — Other Ambulatory Visit (HOSPITAL_COMMUNITY): Payer: Self-pay

## 2023-03-12 MED ORDER — POTASSIUM CHLORIDE ER 10 MEQ PO CPCR
10.0000 meq | ORAL_CAPSULE | Freq: Two times a day (BID) | ORAL | 2 refills | Status: DC
  Filled 2023-03-12: qty 60, 30d supply, fill #0

## 2023-03-12 NOTE — Telephone Encounter (Signed)
ICD-10-CM   1. Primary hypertension  I10 potassium chloride (MICRO-K) 10 MEQ CR capsule     Medications Discontinued During This Encounter  Medication Reason   potassium chloride SA (KLOR-CON M) 20 MEQ tablet Dose change

## 2023-03-13 ENCOUNTER — Encounter (HOSPITAL_COMMUNITY): Payer: PPO

## 2023-03-13 ENCOUNTER — Other Ambulatory Visit (HOSPITAL_COMMUNITY): Payer: Self-pay

## 2023-03-13 ENCOUNTER — Telehealth: Payer: Self-pay | Admitting: *Deleted

## 2023-03-13 MED ORDER — TRAMADOL HCL 50 MG PO TABS
50.0000 mg | ORAL_TABLET | Freq: Three times a day (TID) | ORAL | 0 refills | Status: DC | PRN
  Filled 2023-03-13: qty 15, 5d supply, fill #0

## 2023-03-13 NOTE — Progress Notes (Signed)
Pt left VM to cx 2/17 appt with Licensed Clinical SW - says her regular therapist is back in town and has f/u with her.

## 2023-03-14 ENCOUNTER — Other Ambulatory Visit (HOSPITAL_COMMUNITY): Payer: Self-pay

## 2023-03-16 ENCOUNTER — Encounter (HOSPITAL_COMMUNITY): Payer: PPO

## 2023-03-18 ENCOUNTER — Encounter (HOSPITAL_COMMUNITY): Payer: PPO

## 2023-03-18 ENCOUNTER — Encounter (HOSPITAL_COMMUNITY)
Admission: RE | Admit: 2023-03-18 | Discharge: 2023-03-18 | Disposition: A | Discharge status: Home / Self Care | Payer: PPO | Source: Ambulatory Visit | Attending: Cardiology

## 2023-03-18 DIAGNOSIS — Z955 Presence of coronary angioplasty implant and graft: Secondary | ICD-10-CM

## 2023-03-18 DIAGNOSIS — Z48812 Encounter for surgical aftercare following surgery on the circulatory system: Secondary | ICD-10-CM | POA: Diagnosis not present

## 2023-03-18 DIAGNOSIS — I214 Non-ST elevation (NSTEMI) myocardial infarction: Secondary | ICD-10-CM

## 2023-03-20 ENCOUNTER — Encounter (HOSPITAL_COMMUNITY)
Admission: RE | Admit: 2023-03-20 | Discharge: 2023-03-20 | Disposition: A | Discharge status: Home / Self Care | Payer: PPO | Source: Ambulatory Visit | Attending: Cardiology

## 2023-03-20 ENCOUNTER — Encounter (HOSPITAL_COMMUNITY): Payer: PPO

## 2023-03-20 DIAGNOSIS — Z48812 Encounter for surgical aftercare following surgery on the circulatory system: Secondary | ICD-10-CM | POA: Diagnosis not present

## 2023-03-20 DIAGNOSIS — Z955 Presence of coronary angioplasty implant and graft: Secondary | ICD-10-CM

## 2023-03-20 DIAGNOSIS — I214 Non-ST elevation (NSTEMI) myocardial infarction: Secondary | ICD-10-CM

## 2023-03-20 NOTE — Progress Notes (Signed)
Cardiac Individual Treatment Plan  Patient Details  Name: Amanda Kim MRN: [id number] Date of Birth: Jan 02, 1946 Referring Provider:   Flowsheet Row INTENSIVE CARDIAC REHAB ORIENT from 02/25/2023 in [facility] for Heart, Vascular, & Lung Health  Referring Provider Yates Decamp, MD       Initial Encounter Date:  Flowsheet Row INTENSIVE CARDIAC REHAB ORIENT from 02/25/2023 in [facility] for Heart, Vascular, & Lung Health  Date 02/25/23       Visit Diagnosis: 02/06/23 NSTEMI (non-ST elevated myocardial infarction) (HCC)  02/06/23 DES LAD and LCX  Patient's Home Medications on Admission:  Current Outpatient Medications:    amLODipine (NORVASC) 10 MG tablet, Take 10 mg by mouth daily., Disp: , Rfl:    amLODipine (NORVASC) 10 MG tablet, Take 1 tablet (10 mg total) by mouth daily., Disp: 90 tablet, Rfl: 3   Ascorbic Acid (VITAMIN C) 1000 MG tablet, Take 1,000 mg by mouth daily., Disp: , Rfl:    aspirin EC 81 MG tablet, Take 1 tablet (81 mg total) by mouth daily. Swallow whole., Disp: 30 tablet, Rfl: 11   atorvastatin (LIPITOR) 40 MG tablet, Take 1 tablet (40 mg total) by mouth daily., Disp: 30 tablet, Rfl: 5   BLACK CURRANT SEED OIL PO, Take 1 tablet by mouth daily., Disp: , Rfl:    brompheniramine-pseudoephedrine-DM 30-2-10 MG/5ML syrup, Take 5 mLs by mouth 3 (three) times daily as needed (Cough). (Patient not taking: Reported on 02/25/2023), Disp: , Rfl:    buPROPion (WELLBUTRIN SR) 150 MG 12 hr tablet, Take 1 tablet (150 mg total) by mouth daily for 3 days, THEN 1 tablet (150 mg total) 2 (two) times daily thereafter., Disp: 60 tablet, Rfl: 6   Calcium Citrate-Vitamin D (CALCIUM CITRATE+D3 PO), Take 1 tablet by mouth daily., Disp: , Rfl:    CALCIUM PO, Take 1 tablet by mouth daily., Disp: , Rfl:    COLLAGEN PO, Take 1 Dose by mouth daily., Disp: , Rfl:    COLOSTRUM PO, Take 1 Dose by mouth 4 (four) times a week., Disp: , Rfl:     diclofenac (VOLTAREN) 50 MG EC tablet, Take 25 mg by mouth 2 (two) times daily., Disp: , Rfl:    DULoxetine (CYMBALTA) 20 MG capsule, Take 20 mg by mouth 2 (two) times daily., Disp: , Rfl:    DULoxetine (CYMBALTA) 20 MG capsule, Take 2 capsules (40 mg total) by mouth daily., Disp: 180 capsule, Rfl: 1   Estradiol (VAGIFEM) 10 MCG TABS vaginal tablet, Place 10 mcg vaginally 2 (two) times a week., Disp: , Rfl:    Estradiol 10 MCG TABS vaginal tablet, Place 1 tablet (10 mcg total) vaginally 2 (two) times a week at bedtime as directed., Disp: 24 tablet, Rfl: 3   gabapentin (NEURONTIN) 100 MG capsule, Take 200 mg by mouth 2 (two) times daily., Disp: , Rfl:    hydrOXYzine (ATARAX) 25 MG tablet, Take 25-75 mg by mouth daily as needed (Sleep)., Disp: , Rfl:    hydrOXYzine (ATARAX) 25 MG tablet, Take 1-3 tablets (25-75 mg total) by mouth daily as needed for sleep or anxiety., Disp: 90 tablet, Rfl: 3   ketoconazole (NIZORAL) 2 % shampoo, Apply 1 Application topically 3 (three) times a week., Disp: , Rfl:    levothyroxine (SYNTHROID) 100 MCG tablet, Take 1 tablet (100 mcg total) by mouth daily before breakfast on an empty stomach., Disp: 90 tablet, Rfl: 0   levothyroxine (SYNTHROID, LEVOTHROID) 100 MCG tablet, Take 100 mcg  by mouth every morning., Disp: , Rfl: 0   losartan (COZAAR) 100 MG tablet, Take 100 mg by mouth daily., Disp: , Rfl:    losartan (COZAAR) 100 MG tablet, Take 1 tablet (100 mg total) by mouth every evening., Disp: 90 tablet, Rfl: 0   metoprolol succinate (TOPROL XL) 25 MG 24 hr tablet, Take 1 tablet (25 mg total) by mouth daily., Disp: 90 tablet, Rfl: 3   nicotine (NICODERM CQ - DOSED IN MG/24 HOURS) 21 mg/24hr patch, Place 1 patch (21 mg total) onto the skin daily., Disp: 28 patch, Rfl: 0   nitroGLYCERIN (NITROSTAT) 0.4 MG SL tablet, Place 1 tablet (0.4 mg total) under the tongue every 5 (five) minutes as needed for chest pain., Disp: 25 tablet, Rfl: 3   OVER THE COUNTER MEDICATION, Take 750  mg by mouth daily. Green Lipped Mussels, Disp: , Rfl:    OVER THE COUNTER MEDICATION, Take 1 Dose by mouth at bedtime. Sound Asleep Gummy, Disp: , Rfl:    oxyCODONE-acetaminophen (PERCOCET/ROXICET) 5-325 MG tablet, Take 1 tablet by mouth at bedtime as needed for severe pain (pain score 7-10). (Patient not taking: Reported on 02/25/2023), Disp: , Rfl:    pantoprazole (PROTONIX) 40 MG tablet, Take 1 tablet (40 mg total) by mouth daily., Disp: 30 tablet, Rfl: 3   potassium chloride (MICRO-K) 10 MEQ CR capsule, Take 1 capsule (10 mEq total) by mouth 2 (two) times daily., Disp: 60 capsule, Rfl: 2   Sodium Hyaluronate, oral, (HYALURONIC ACID PO), Take 1 Dose by mouth daily., Disp: , Rfl:    ticagrelor (BRILINTA) 90 MG TABS tablet, Take 1 tablet (90 mg total) by mouth 2 (two) times daily., Disp: 60 tablet, Rfl: 11   traMADol (ULTRAM) 50 MG tablet, Take 1 tablet (50 mg total) by mouth 3 (three) times daily as needed for pain., Disp: 15 tablet, Rfl: 0   valACYclovir (VALTREX) 1000 MG tablet, Take 1,000 mg by mouth 2 (two) times daily as needed ([place])., Disp: , Rfl: 2  Past Medical History: Past Medical History:  Diagnosis Date   Back pain    d/t horse fell on pt in the 80's   Bone spur    in neck(since 1990) and 1st 3 fingers on right hand go numb   Chronic pain    d/t horse accident in the 80's   Headache(784.0)    tension   History of colon polyps    Hyperlipidemia    takes Fish Oil and CoQ10   Hypertension    takes Inderal daily   Hypothyroid 05/07/2011   takes Synthroid daily   Leg cramps    takes Potassium prn   Liver disease    nonalcholic fatty liver disease   Nausea & vomiting    phenergan and zofran prn   Spondylolisthesis    Spondylosis of thoracolumbar region without myelopathy or radiculopathy    spine   Urinary frequency     Tobacco Use: Social History   Tobacco Use  Smoking Status Every Day   Current packs/day: 0.80   Average packs/day: 0.8 packs/day for 45.0 years  (36.0 ttl pk-yrs)   Types: Cigarettes  Smokeless Tobacco Never  Tobacco Comments   Using quitline [place] for cessation counseling, down to 6 cigarettes/day.     Labs: Review Flowsheet       Latest Ref Rng & Units 05/20/2011 09/11/2011 02/07/2023  Labs for ITP Cardiac and Pulmonary Rehab  Cholestrol 0 - 200 mg/dL 914  782  956   LDL (  calc) 0 - 99 mg/dL 782  956  62   HDL-C >21 mg/dL 39  42  33   Trlycerides <150 mg/dL 308  657  846   Hemoglobin A1c - - 5.9  -    Capillary Blood Glucose: No results found for: "GLUCAP"   Exercise Target Goals: Exercise Program Goal: Individual exercise prescription set using results from initial 6 min walk test and THRR while considering  patient's activity barriers and safety.   Exercise Prescription Goal: Initial exercise prescription builds to 30-45 minutes a day of aerobic activity, 2-3 days per week.  Home exercise guidelines will be given to patient during program as part of exercise prescription that the participant will acknowledge.  Activity Barriers & Risk Stratification:  Activity Barriers & Cardiac Risk Stratification - 02/25/23 1122       Activity Barriers & Cardiac Risk Stratification   Activity Barriers Arthritis;Back Problems;Neck/Spine Problems;Balance Concerns;Shortness of Breath;Deconditioning;Joint Problems    Cardiac Risk Stratification High   <5 METs on            6 Minute Walk:  6 Minute Walk     Row Name 02/25/23 1331         6 Minute Walk   Phase Initial     Distance 1680 feet     Walk Time 6 minutes     # of Rest Breaks 0     MPH 3.18     METS 3.21     RPE 11     Perceived Dyspnea  0     VO2 Peak 11.24     Symptoms Yes (comment)     Comments Bilateral hip pain 4/10, resolved with rest     Resting HR 50 bpm     Resting BP 98/64     Resting Oxygen Saturation  95 %     Exercise Oxygen Saturation  during 6 min walk 95 %     Max Ex. HR 82 bpm     Max Ex. BP 118/66     2 Minute Post BP 104/64               Oxygen Initial Assessment:   Oxygen Re-Evaluation:   Oxygen Discharge (Final Oxygen Re-Evaluation):   Initial Exercise Prescription:  Initial Exercise Prescription - 02/25/23 1300       Date of Initial Exercise RX and Referring Provider   Date 02/25/23    Referring Provider Yates Decamp, MD    Expected Discharge Date 05/20/23      Treadmill   MPH 2.8    Grade 0    Minutes 15    METs 3.17      NuStep   Level 1    SPM 75    Minutes 15    METs 2.5      Prescription Details   Frequency (times per week) 3    Duration Progress to 30 minutes of continuous aerobic without signs/symptoms of physical distress      Intensity   THRR 40-80% of Max Heartrate 58-115    Ratings of Perceived Exertion 11-13    Perceived Dyspnea 0-4      Progression   Progression Continue progressive overload as per policy without signs/symptoms or physical distress.      Resistance Training   Training Prescription Yes    Weight 2    Reps 10-15             Perform Capillary Blood Glucose checks as needed.  Exercise  Prescription Changes:   Exercise Prescription Changes     Row Name 03/04/23 1621             Response to Exercise   Blood Pressure (Admit) 120/58       Blood Pressure (Exercise) 132/74       Blood Pressure (Exit) 98/54       Heart Rate (Admit) 55 bpm       Heart Rate (Exercise) 110 bpm       Heart Rate (Exit) 63 bpm       Rating of Perceived Exertion (Exercise) 12       Perceived Dyspnea (Exercise) 0       Symptoms 0       Comments Pt first day in the [facility] program       Duration Progress to 30 minutes of  aerobic without signs/symptoms of physical distress       Intensity THRR unchanged         Progression   Progression Continue to progress workloads to maintain intensity without signs/symptoms of physical distress.       Average METs 2.27         Resistance Training   Training Prescription No       Weight 2       Reps 10-15       Time  10 Minutes         Treadmill   MPH 2.8       Grade 0       Minutes 15       METs 3.14         NuStep   Level 1       SPM 51       Minutes 15       METs 1.4                Exercise Comments:   Exercise Comments     Row Name 03/04/23 1627           Exercise Comments Pt first day in the [facility] I[facility] program. Pt tolerated exercise well with an average MET level of 2.27. Pt is off to a good start and is learning her THRR, RPE and ExRx                Exercise Goals and Review:   Exercise Goals     Row Name 02/25/23 1122             Exercise Goals   Increase Physical Activity Yes       Intervention Provide advice, education, support and counseling about physical activity/exercise needs.;Develop an individualized exercise prescription for aerobic and resistive training based on initial evaluation findings, risk stratification, comorbidities and participant's personal goals.       Expected Outcomes Short Term: Attend rehab on a regular basis to increase amount of physical activity.;Long Term: Exercising regularly at least 3-5 days a week.;Long Term: Add in home exercise to make exercise part of routine and to increase amount of physical activity.       Increase Strength and Stamina Yes       Intervention Provide advice, education, support and counseling about physical activity/exercise needs.;Develop an individualized exercise prescription for aerobic and resistive training based on initial evaluation findings, risk stratification, comorbidities and participant's personal goals.       Expected Outcomes Short Term: Increase workloads from initial exercise prescription for resistance, speed, and METs.;Short Term: Perform resistance training exercises routinely during  rehab and add in resistance training at home;Long Term: Improve cardiorespiratory fitness, muscular endurance and strength as measured by increased METs and functional capacity ( )       Able to understand  and use rate of perceived exertion (RPE) scale Yes       Intervention Provide education and explanation on how to use RPE scale       Expected Outcomes Short Term: Able to use RPE daily in rehab to express subjective intensity level;Long Term:  Able to use RPE to guide intensity level when exercising independently       Knowledge and understanding of Target Heart Rate Range (THRR) Yes       Intervention Provide education and explanation of THRR including how the numbers were predicted and where they are located for reference       Expected Outcomes Short Term: Able to state/look up THRR;Short Term: Able to use daily as guideline for intensity in rehab;Long Term: Able to use THRR to govern intensity when exercising independently       Understanding of Exercise Prescription Yes       Intervention Provide education, explanation, and written materials on patient's individual exercise prescription       Expected Outcomes Short Term: Able to explain program exercise prescription;Long Term: Able to explain home exercise prescription to exercise independently                Exercise Goals Re-Evaluation :  Exercise Goals Re-Evaluation     Row Name 03/04/23 1625             Exercise Goal Re-Evaluation   Exercise Goals Review Increase Physical Activity;Understanding of Exercise Prescription;Increase Strength and Stamina;Knowledge and understanding of Target Heart Rate Range (THRR);Able to understand and use rate of perceived exertion (RPE) scale       Comments Pt first day in the [facility] program. Pt tolerated exercise well with an average MET level of 2.27. Pt is off to a good start and is learning her THRR, RPE and ExRx       Expected Outcomes Will continue to monitor pt and progress workloads as tolerated without sign or symptom                Discharge Exercise Prescription (Final Exercise Prescription Changes):  Exercise Prescription Changes - 03/04/23 1621       Response to  Exercise   Blood Pressure (Admit) 120/58    Blood Pressure (Exercise) 132/74    Blood Pressure (Exit) 98/54    Heart Rate (Admit) 55 bpm    Heart Rate (Exercise) 110 bpm    Heart Rate (Exit) 63 bpm    Rating of Perceived Exertion (Exercise) 12    Perceived Dyspnea (Exercise) 0    Symptoms 0    Comments Pt first day in the [facility] program    Duration Progress to 30 minutes of  aerobic without signs/symptoms of physical distress    Intensity THRR unchanged      Progression   Progression Continue to progress workloads to maintain intensity without signs/symptoms of physical distress.    Average METs 2.27      Resistance Training   Training Prescription No    Weight 2    Reps 10-15    Time 10 Minutes      Treadmill   MPH 2.8    Grade 0    Minutes 15    METs 3.14      NuStep   Level 1  SPM 51    Minutes 15    METs 1.4             Nutrition:  Target Goals: Understanding of nutrition guidelines, daily intake of sodium 1500mg , cholesterol 200mg , calories 30% from fat and 7% or less from saturated fats, daily to have 5 or more servings of fruits and vegetables.  Biometrics:  Pre Biometrics - 02/25/23 1117       Pre Biometrics   Waist Circumference 34 inches    Hip Circumference 36.5 inches    Waist to Hip Ratio 0.93 %    Triceps Skinfold 18 mm    % Body Fat 34.1 %    Grip Strength 28 kg    Flexibility --   not done, low back pain   Single Leg Stand 26.4 seconds              Nutrition Therapy Plan and Nutrition Goals:   Nutrition Assessments:  MEDIFICTS Score Key: >=70 Need to make dietary changes  40-70 Heart Healthy Diet <= 40 Therapeutic Level Cholesterol Diet   Flowsheet Row INTENSIVE CARDIAC REHAB from 03/04/2023 in [facility] for Heart, Vascular, & Lung Health  Picture Your Plate Total Score on Admission 67      Picture Your Plate Scores: <35 Unhealthy dietary pattern with much room for improvement. 41-50  Dietary pattern unlikely to meet recommendations for good health and room for improvement. 51-60 More healthful dietary pattern, with some room for improvement.  >60 Healthy dietary pattern, although there may be some specific behaviors that could be improved.    Nutrition Goals Re-Evaluation:   Nutrition Goals Re-Evaluation:   Nutrition Goals Discharge (Final Nutrition Goals Re-Evaluation):   Psychosocial: Target Goals: Acknowledge presence or absence of significant depression and/or stress, maximize coping skills, provide positive support system. Participant is able to verbalize types and ability to use techniques and skills needed for reducing stress and depression.  Initial Review & Psychosocial Screening:  Initial Psych Review & Screening - 02/25/23 1126       Initial Review   Current issues with Current Depression;Current Anxiety/Panic;Current Sleep Concerns;Current Stress Concerns    Source of Stress Concerns Family    Comments Ryhanna shared that she has some current feelings of anxiety/depression/stress mostly related to family and her daughters health. Her daughter is very dependent of Jasey which has caused strain on their relationship. Noah is currently seeing a counselor and denies any need for additional resources at this time.      Family Dynamics   Good Support System? Yes   Has friends for support     Barriers   Psychosocial barriers to participate in program The patient should benefit from training in stress management and relaxation.      Screening Interventions   Interventions Provide feedback about the scores to participant;To provide support and resources with identified psychosocial needs;Encouraged to exercise    Expected Outcomes Long Term goal: The participant improves quality of Life and PHQ9 Scores as seen by post scores and/or verbalization of changes;Short Term goal: Identification and review with participant of any Quality of Life or Depression  concerns found by scoring the questionnaire.;Long Term Goal: Stressors or current issues are controlled or eliminated.;Short Term goal: Utilizing psychosocial counselor, staff and physician to assist with identification of specific Stressors or current issues interfering with healing process. Setting desired goal for each stressor or current issue identified.             Quality  of Life Scores:  Quality of Life - 02/25/23 1333       Quality of Life   Select Quality of Life      Quality of Life Scores   Health/Function Pre 11.37 %    Socioeconomic Pre 23.06 %    Psych/Spiritual Pre 20.93 %    Family Pre 6.5 %    GLOBAL Pre 15.51 %            Scores of 19 and below usually indicate a poorer quality of life in these areas.  A difference of  2-3 points is a clinically meaningful difference.  A difference of 2-3 points in the total score of the Quality of Life Index has been associated with significant improvement in overall quality of life, self-image, physical symptoms, and general health in studies assessing change in quality of life.  PHQ-9: Review Flowsheet  More data exists      02/25/2023 02/20/2023 09/23/2017 03/21/2014 08/31/2013  Depression screen PHQ 2/9  Decreased Interest 2 1 0 0 0  Down, Depressed, Hopeless 1 0 0 0 0  PHQ - 2 Score 3 1 0 0 0  Altered sleeping 1 - - 1 -  Tired, decreased energy 3 - - 1 -  Change in appetite 0 - - 0 -  Feeling bad or failure about yourself  0 - - 0 -  Trouble concentrating 0 - - 0 -  Moving slowly or fidgety/restless 0 - - 0 -  Suicidal thoughts 0 - - 0 -  PHQ-9 Score 7 - - 2 -  Difficult doing work/chores Somewhat difficult - - - -   Interpretation of Total Score  Total Score Depression Severity:  1-4 = Minimal depression, 5-9 = Mild depression, 10-14 = Moderate depression, 15-19 = Moderately severe depression, 20-27 = Severe depression   Psychosocial Evaluation and Intervention:   Psychosocial Re-Evaluation:  Psychosocial  Re-Evaluation     Row Name 03/05/23 0904 03/20/23 1151           Psychosocial Re-Evaluation   Current issues with Current Sleep Concerns;Current Stress Concerns;Current Depression;History of Depression;Current Anxiety/Panic;Current Psychotropic Meds Current Sleep Concerns;Current Stress Concerns;Current Depression;History of Depression;Current Anxiety/Panic;Current Psychotropic Meds      Comments Reviewed quality of life with Deon on her first day of exercise.Ceirra admits to being slightly dissatisfied with her health due to her multiple health issues. Amarionna has not had any chest pain recently. Dorotea did say that she is experiencing some intermittent shortness of breath. I recommended that Kyndell drink a carbonated beverage when she takes her Brillinta to see if that helps improve her symptoms. Venora also says she is experiencing some continued fatigue. Amayah did well with her first day of exercise. Jalin is hopeful that participating in cardiac rehab will improve her stamina. Lamica says that both her adult children are on disability. Teofila helps them as she can this sometimes is challenging for her. Tondra does admit to experiencing some depression. Delara says she is taking Cymbalta for fibromyalgia. Marasia says she has an appointment with her primary care Dr Nicholos Johns on Monday. Will forward quality of life questionnaire review as requested. Shamina says she is receiving therapy biweekly. Shannin says she is going to talk to Dr Nicholos Johns about adding Wellbutrin to help with her depression and smoking cessation. Corliis has not voiced any increased concerns or stressors during exercise at cardiac rehab.      Expected Outcomes Davonna will have controlled or decreased depression and anxiety upon completion of  cardiac rehab. Karly will have controlled or decreased depression and anxiety upon completion of cardiac rehab.      Interventions Stress management  education;Encouraged to attend Cardiac Rehabilitation for the exercise;Relaxation education Stress management education;Encouraged to attend Cardiac Rehabilitation for the exercise;Relaxation education      Continue Psychosocial Services  Follow up required by staff Follow up required by staff        Initial Review   Source of Stress Concerns Chronic Illness;Family;Unable to participate in former interests or hobbies;Unable to perform yard/household activities Chronic Illness;Family;Unable to participate in former interests or hobbies;Unable to perform yard/household activities      Comments Will continue to monitor and offer support as needed. Will continue to monitor and offer support as needed.               Psychosocial Discharge (Final Psychosocial Re-Evaluation):  Psychosocial Re-Evaluation - 03/20/23 1151       Psychosocial Re-Evaluation   Current issues with Current Sleep Concerns;Current Stress Concerns;Current Depression;History of Depression;Current Anxiety/Panic;Current Psychotropic Meds    Comments Corliis has not voiced any increased concerns or stressors during exercise at cardiac rehab.    Expected Outcomes Darcey will have controlled or decreased depression and anxiety upon completion of cardiac rehab.    Interventions Stress management education;Encouraged to attend Cardiac Rehabilitation for the exercise;Relaxation education    Continue Psychosocial Services  Follow up required by staff      Initial Review   Source of Stress Concerns Chronic Illness;Family;Unable to participate in former interests or hobbies;Unable to perform yard/household activities    Comments Will continue to monitor and offer support as needed.             Vocational Rehabilitation: Provide vocational rehab assistance to qualifying candidates.   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 Participant will state understanding/return demonstration of topics presented.    Education     Row Name 03/04/23 1600     Education   Cardiac Education Topics [facility]   Customer service manager   Weekly Topic Fast Evening Meals   Instruction Review Code 1- Verbalizes Understanding   Class Start Time 1400   Class Stop Time 1441   Class Time Calculation (min) 41 min    Row Name 03/11/23 1400     Education   Cardiac Education Topics [facility]   Customer service manager   Weekly Topic International Cuisine- Spotlight on the [employer] Zones   Instruction Review Code 1- Verbalizes Understanding   Class Start Time 1400   Class Stop Time 1441   Class Time Calculation (min) 41 min    Row Name 03/18/23 1500     Education   Cardiac Education Topics [facility]   Customer service manager   Weekly Topic Simple Sides and Sauces   Instruction Review Code 1- Verbalizes Understanding   Class Start Time 1400   Class Stop Time 1436   Class Time Calculation (min) 36 min  Core Videos: Exercise    Move It!  Clinical staff conducted group or individual video education with verbal and written material and guidebook.  Patient learns the recommended Pritikin exercise program. Exercise with the goal of living a long, healthy life. Some of the health benefits of exercise include controlled diabetes, healthier blood pressure levels, improved cholesterol levels, improved heart and lung capacity, improved sleep, and better body composition. Everyone should speak with their doctor before starting or changing an exercise routine.  Biomechanical  Limitations Clinical staff conducted group or individual video education with verbal and written material and guidebook.  Patient learns how biomechanical limitations can impact exercise and how we can mitigate and possibly overcome limitations to have an impactful and balanced exercise routine.  Body Composition Clinical staff conducted group or individual video education with verbal and written material and guidebook.  Patient learns that body composition (ratio of muscle mass to fat mass) is a key component to assessing overall fitness, rather than body weight alone. Increased fat mass, especially visceral belly fat, can put [place] at increased risk for metabolic syndrome, type 2 diabetes, heart disease, and even death. It is recommended to combine diet and exercise (cardiovascular and resistance training) to improve your body composition. Seek guidance from your physician and exercise physiologist before implementing an exercise routine.  Exercise Action Plan Clinical staff conducted group or individual video education with verbal and written material and guidebook.  Patient learns the recommended strategies to achieve and enjoy long-term exercise adherence, including variety, self-motivation, self-efficacy, and positive decision making. Benefits of exercise include fitness, good health, weight management, more energy, better sleep, less stress, and overall well-being.  Medical   Heart Disease Risk Reduction Clinical staff conducted group or individual video education with verbal and written material and guidebook.  Patient learns our heart is our most vital organ as it circulates oxygen, nutrients, white blood cells, and hormones throughout the entire body, and carries waste away. Data supports a plant-based eating plan like the Pritikin Program for its effectiveness in slowing progression of and reversing heart disease. The video provides a number of recommendations to address heart  disease.   Metabolic Syndrome and Belly Fat  Clinical staff conducted group or individual video education with verbal and written material and guidebook.  Patient learns what metabolic syndrome is, how it leads to heart disease, and how one can reverse it and keep it from coming back. You have metabolic syndrome if you have 3 of the following 5 criteria: abdominal obesity, high blood pressure, high triglycerides, low HDL cholesterol, and high blood sugar.  Hypertension and Heart Disease Clinical staff conducted group or individual video education with verbal and written material and guidebook.  Patient learns that high blood pressure, or hypertension, is very common in the [place]. Hypertension is largely due to excessive salt intake, but other important risk factors include being overweight, physical inactivity, drinking too much alcohol, smoking, and not eating enough potassium from fruits and vegetables. High blood pressure is a leading risk factor for heart attack, stroke, congestive heart failure, dementia, kidney failure, and premature death. Long-term effects of excessive salt intake include stiffening of the arteries and thickening of heart muscle and organ damage. Recommendations include ways to reduce hypertension and the risk of heart disease.  Diseases of Our Time - Focusing on Diabetes Clinical staff conducted group or individual video education with verbal and written material and guidebook.  Patient learns why the best way to stop diseases of our time  is prevention, through food and other lifestyle changes. Medicine (such as prescription pills and surgeries) is often only a Band-Aid on the problem, not a long-term solution. Most common diseases of our time include obesity, type 2 diabetes, hypertension, heart disease, and cancer. The Pritikin Program is recommended and has been proven to help reduce, reverse, and/or prevent the damaging effects of metabolic syndrome.  Nutrition    Overview of the Pritikin Eating Plan  Clinical staff conducted group or individual video education with verbal and written material and guidebook.  Patient learns about the Pritikin Eating Plan for disease risk reduction. The Pritikin Eating Plan emphasizes a wide variety of unrefined, minimally-processed carbohydrates, like fruits, vegetables, whole grains, and legumes. Go, Caution, and Stop food choices are explained. Plant-based and lean animal proteins are emphasized. Rationale provided for low sodium intake for blood pressure control, low added sugars for blood sugar stabilization, and low added fats and oils for coronary artery disease risk reduction and weight management.  Calorie Density  Clinical staff conducted group or individual video education with verbal and written material and guidebook.  Patient learns about calorie density and how it impacts the Pritikin Eating Plan. Knowing the characteristics of the food you choose will help you decide whether those foods will lead to weight gain or weight loss, and whether you want to consume more or less of them. Weight loss is usually a side effect of the Pritikin Eating Plan because of its focus on low calorie-dense foods.  Label Reading  Clinical staff conducted group or individual video education with verbal and written material and guidebook.  Patient learns about the Pritikin recommended label reading guidelines and corresponding recommendations regarding calorie density, added sugars, sodium content, and whole grains.  Dining Out - Part 1  Clinical staff conducted group or individual video education with verbal and written material and guidebook.  Patient learns that restaurant meals can be sabotaging because they can be so high in calories, fat, sodium, and/or sugar. Patient learns recommended strategies on how to positively address this and avoid unhealthy pitfalls.  Facts on Fats  Clinical staff conducted group or individual video  education with verbal and written material and guidebook.  Patient learns that lifestyle modifications can be just as effective, if not more so, as many medications for lowering your risk of heart disease. A Pritikin lifestyle can help to reduce your risk of inflammation and atherosclerosis (cholesterol build-up, or plaque, in the artery walls). Lifestyle interventions such as dietary choices and physical activity address the cause of atherosclerosis. A review of the types of fats and their impact on blood cholesterol levels, along with dietary recommendations to reduce fat intake is also included.  Nutrition Action Plan  Clinical staff conducted group or individual video education with verbal and written material and guidebook.  Patient learns how to incorporate Pritikin recommendations into their lifestyle. Recommendations include planning and keeping personal health goals in mind as an important part of their success.  Healthy Mind-Set    Healthy Minds, Bodies, Hearts  Clinical staff conducted group or individual video education with verbal and written material and guidebook.  Patient learns how to identify when they are stressed. Video will discuss the impact of that stress, as well as the many benefits of stress management. Patient will also be introduced to stress management techniques. The way we think, act, and feel has an impact on our hearts.  How Our Thoughts Can Heal Our Hearts  Clinical staff conducted group or individual video  education with verbal and written material and guidebook.  Patient learns that negative thoughts can cause depression and anxiety. This can result in negative lifestyle behavior and serious health problems. Cognitive behavioral therapy is an effective method to help control our thoughts in order to change and improve our emotional outlook.  Additional Videos:  Exercise    Improving Performance  Clinical staff conducted group or individual video education with  verbal and written material and guidebook.  Patient learns to use a non-linear approach by alternating intensity levels and lengths of time spent exercising to help burn more calories and lose more body fat. Cardiovascular exercise helps improve heart health, metabolism, hormonal balance, blood sugar control, and recovery from fatigue. Resistance training improves strength, endurance, balance, coordination, reaction time, metabolism, and muscle mass. Flexibility exercise improves circulation, posture, and balance. Seek guidance from your physician and exercise physiologist before implementing an exercise routine and learn your capabilities and proper form for all exercise.  Introduction to Yoga  Clinical staff conducted group or individual video education with verbal and written material and guidebook.  Patient learns about yoga, a discipline of the coming together of mind, breath, and body. The benefits of yoga include improved flexibility, improved range of motion, better posture and core strength, increased lung function, weight loss, and positive self-image. Yoga's heart health benefits include lowered blood pressure, healthier heart rate, decreased cholesterol and triglyceride levels, improved immune function, and reduced stress. Seek guidance from your physician and exercise physiologist before implementing an exercise routine and learn your capabilities and proper form for all exercise.  Medical   Aging: Enhancing Your Quality of Life  Clinical staff conducted group or individual video education with verbal and written material and guidebook.  Patient learns key strategies and recommendations to stay in good physical health and enhance quality of life, such as prevention strategies, having an advocate, securing a Health Care Proxy and Power of Attorney, and keeping a list of medications and system for tracking them. It also discusses how to avoid risk for bone loss.  Biology of Weight Control   Clinical staff conducted group or individual video education with verbal and written material and guidebook.  Patient learns that weight gain occurs because we consume more calories than we burn (eating more, moving less). Even if your body weight is normal, you may have higher ratios of fat compared to muscle mass. Too much body fat puts you at increased risk for cardiovascular disease, heart attack, stroke, type 2 diabetes, and obesity-related cancers. In addition to exercise, following the Pritikin Eating Plan can help reduce your risk.  Decoding Lab Results  Clinical staff conducted group or individual video education with verbal and written material and guidebook.  Patient learns that lab test reflects one measurement whose values change over time and are influenced by many factors, including medication, stress, sleep, exercise, food, hydration, pre-existing medical conditions, and more. It is recommended to use the knowledge from this video to become more involved with your lab results and evaluate your numbers to speak with your doctor.   Diseases of Our Time - Overview  Clinical staff conducted group or individual video education with verbal and written material and guidebook.  Patient learns that according to the CDC, 50% to 70% of chronic diseases (such as obesity, type 2 diabetes, elevated lipids, hypertension, and heart disease) are avoidable through lifestyle improvements including healthier food choices, listening to satiety cues, and increased physical activity.  Sleep Disorders Clinical staff conducted group or individual  video education with verbal and written material and guidebook.  Patient learns how good quality and duration of sleep are important to overall health and well-being. Patient also learns about sleep disorders and how they impact health along with recommendations to address them, including discussing with a physician.  Nutrition  Dining Out - Part 2 Clinical staff  conducted group or individual video education with verbal and written material and guidebook.  Patient learns how to plan ahead and communicate in order to maximize their dining experience in a healthy and nutritious manner. Included are recommended food choices based on the type of restaurant the patient is visiting.   Fueling a Banker conducted group or individual video education with verbal and written material and guidebook.  There is a strong connection between our food choices and our health. Diseases like obesity and type 2 diabetes are very prevalent and are in large-part due to lifestyle choices. The Pritikin Eating Plan provides plenty of food and hunger-curbing satisfaction. It is easy to follow, affordable, and helps reduce health risks.  Menu Workshop  Clinical staff conducted group or individual video education with verbal and written material and guidebook.  Patient learns that restaurant meals can sabotage health goals because they are often packed with calories, fat, sodium, and sugar. Recommendations include strategies to plan ahead and to communicate with the manager, chef, or server to help order a healthier meal.  Planning Your Eating Strategy  Clinical staff conducted group or individual video education with verbal and written material and guidebook.  Patient learns about the Pritikin Eating Plan and its benefit of reducing the risk of disease. The Pritikin Eating Plan does not focus on calories. Instead, it emphasizes high-quality, nutrient-rich foods. By knowing the characteristics of the foods, we choose, we can determine their calorie density and make informed decisions.  Targeting Your Nutrition Priorities  Clinical staff conducted group or individual video education with verbal and written material and guidebook.  Patient learns that lifestyle habits have a tremendous impact on disease risk and progression. This video provides eating and physical  activity recommendations based on your personal health goals, such as reducing LDL cholesterol, losing weight, preventing or controlling type 2 diabetes, and reducing high blood pressure.  Vitamins and Minerals  Clinical staff conducted group or individual video education with verbal and written material and guidebook.  Patient learns different ways to obtain key vitamins and minerals, including through a recommended healthy diet. It is important to discuss all supplements you take with your doctor.   Healthy Mind-Set    Smoking Cessation  Clinical staff conducted group or individual video education with verbal and written material and guidebook.  Patient learns that cigarette smoking and tobacco addiction pose a serious health risk which affects millions of people. Stopping smoking will significantly reduce the risk of heart disease, lung disease, and many forms of cancer. Recommended strategies for quitting are covered, including working with your doctor to develop a successful plan.  Culinary   Becoming a Set designer conducted group or individual video education with verbal and written material and guidebook.  Patient learns that cooking at home can be healthy, cost-effective, quick, and puts them in control. Keys to cooking healthy recipes will include looking at your recipe, assessing your equipment needs, planning ahead, making it simple, choosing cost-effective seasonal ingredients, and limiting the use of added fats, salts, and sugars.  Cooking - Breakfast and Snacks  Clinical staff conducted group or individual  video education with verbal and written material and guidebook.  Patient learns how important breakfast is to satiety and nutrition through the entire day. Recommendations include key foods to eat during breakfast to help stabilize blood sugar levels and to prevent overeating at meals later in the day. Planning ahead is also a key component.  Cooking - Psychologist, educational conducted group or individual video education with verbal and written material and guidebook.  Patient learns eating strategies to improve overall health, including an approach to cook more at home. Recommendations include thinking of animal protein as a side on your plate rather than center stage and focusing instead on lower calorie dense options like vegetables, fruits, whole grains, and plant-based proteins, such as beans. Making sauces in large quantities to freeze for later and leaving the skin on your vegetables are also recommended to maximize your experience.  Cooking - Healthy Salads and Dressing Clinical staff conducted group or individual video education with verbal and written material and guidebook.  Patient learns that vegetables, fruits, whole grains, and legumes are the foundations of the Pritikin Eating Plan. Recommendations include how to incorporate each of these in flavorful and healthy salads, and how to create homemade salad dressings. Proper handling of ingredients is also covered. Cooking - Soups and [employer] - Soups and Desserts Clinical staff conducted group or individual video education with verbal and written material and guidebook.  Patient learns that Pritikin soups and desserts make for easy, nutritious, and delicious snacks and meal components that are low in sodium, fat, sugar, and calorie density, while high in vitamins, minerals, and filling fiber. Recommendations include simple and healthy ideas for soups and desserts.   Overview     The Pritikin Solution Program Overview Clinical staff conducted group or individual video education with verbal and written material and guidebook.  Patient learns that the results of the Pritikin Program have been documented in more than 100 articles published in peer-reviewed journals, and the benefits include reducing risk factors for (and, in some cases, even reversing) high cholesterol, high  blood pressure, type 2 diabetes, obesity, and more! An overview of the three key pillars of the Pritikin Program will be covered: eating well, doing regular exercise, and having a healthy mind-set.  WORKSHOPS  Exercise: Exercise Basics: Building Your Action Plan Clinical staff led group instruction and group discussion with PowerPoint presentation and patient guidebook. To enhance the learning environment the use of posters, models and videos may be added. At the conclusion of this workshop, patients will comprehend the difference between physical activity and exercise, as well as the benefits of incorporating both, into their routine. Patients will understand the FITT (Frequency, Intensity, Time, and Type) principle and how to use it to build an exercise action plan. In addition, safety concerns and other considerations for exercise and cardiac rehab will be addressed by the presenter. The purpose of this lesson is to promote a comprehensive and effective weekly exercise routine in order to improve patients' overall level of fitness.   Managing Heart Disease: Your Path to a Healthier Heart Clinical staff led group instruction and group discussion with PowerPoint presentation and patient guidebook. To enhance the learning environment the use of posters, models and videos may be added.At the conclusion of this workshop, patients will understand the anatomy and physiology of the heart. Additionally, they will understand how Pritikin's three pillars impact the risk factors, the progression, and the management of heart disease.  The purpose of  this lesson is to provide a high-level overview of the heart, heart disease, and how the Pritikin lifestyle positively impacts risk factors.  Exercise Biomechanics Clinical staff led group instruction and group discussion with PowerPoint presentation and patient guidebook. To enhance the learning environment the use of posters, models and videos may be added.  Patients will learn how the structural parts of their bodies function and how these functions impact their daily activities, movement, and exercise. Patients will learn how to promote a neutral spine, learn how to manage pain, and identify ways to improve their physical movement in order to promote healthy living. The purpose of this lesson is to expose patients to common physical limitations that impact physical activity. Participants will learn practical ways to adapt and manage aches and pains, and to minimize their effect on regular exercise. Patients will learn how to maintain good posture while sitting, walking, and lifting.  Balance Training and Fall Prevention  Clinical staff led group instruction and group discussion with PowerPoint presentation and patient guidebook. To enhance the learning environment the use of posters, models and videos may be added. At the conclusion of this workshop, patients will understand the importance of their sensorimotor skills (vision, proprioception, and the vestibular system) in maintaining their ability to balance as they age. Patients will apply a variety of balancing exercises that are appropriate for their current level of function. Patients will understand the common causes for poor balance, possible solutions to these problems, and ways to modify their physical environment in order to minimize their fall risk. The purpose of this lesson is to teach patients about the importance of maintaining balance as they age and ways to minimize their risk of falling.  WORKSHOPS   Nutrition:  Fueling a Ship broker led group instruction and group discussion with PowerPoint presentation and patient guidebook. To enhance the learning environment the use of posters, models and videos may be added. Patients will review the foundational principles of the Pritikin Eating Plan and understand what constitutes a serving size in each of the food groups.  Patients will also learn Pritikin-friendly foods that are better choices when away from home and review make-ahead meal and snack options. Calorie density will be reviewed and applied to three nutrition priorities: weight maintenance, weight loss, and weight gain. The purpose of this lesson is to reinforce (in a group setting) the key concepts around what patients are recommended to eat and how to apply these guidelines when away from home by planning and selecting Pritikin-friendly options. Patients will understand how calorie density may be adjusted for different weight management goals.  Mindful Eating  Clinical staff led group instruction and group discussion with PowerPoint presentation and patient guidebook. To enhance the learning environment the use of posters, models and videos may be added. Patients will briefly review the concepts of the Pritikin Eating Plan and the importance of low-calorie dense foods. The concept of mindful eating will be introduced as well as the importance of paying attention to internal hunger signals. Triggers for non-hunger eating and techniques for dealing with triggers will be explored. The purpose of this lesson is to provide patients with the opportunity to review the basic principles of the Pritikin Eating Plan, discuss the value of eating mindfully and how to measure internal cues of hunger and fullness using the Hunger Scale. Patients will also discuss reasons for non-hunger eating and learn strategies to use for controlling emotional eating.  Targeting Your Nutrition Priorities Clinical staff led group instruction  and group discussion with PowerPoint presentation and patient guidebook. To enhance the learning environment the use of posters, models and videos may be added. Patients will learn how to determine their genetic susceptibility to disease by reviewing their family history. Patients will gain insight into the importance of diet as part of an overall healthy  lifestyle in mitigating the impact of genetics and other environmental insults. The purpose of this lesson is to provide patients with the opportunity to assess their personal nutrition priorities by looking at their family history, their own health history and current risk factors. Patients will also be able to discuss ways of prioritizing and modifying the Pritikin Eating Plan for their highest risk areas  Menu  Clinical staff led group instruction and group discussion with PowerPoint presentation and patient guidebook. To enhance the learning environment the use of posters, models and videos may be added. Using menus brought in from [employer], or printed from [employer], patients will apply the Pritikin dining out guidelines that were presented in the [employer] video. Patients will also be able to practice these guidelines in a variety of provided scenarios. The purpose of this lesson is to provide patients with the opportunity to practice hands-on learning of the Pritikin Dining Out guidelines with actual menus and practice scenarios.  Label Reading Clinical staff led group instruction and group discussion with PowerPoint presentation and patient guidebook. To enhance the learning environment the use of posters, models and videos may be added. Patients will review and discuss the Pritikin label reading guidelines presented in Pritikin's Label Reading Educational series video. Using fool labels brought in from local grocery stores and markets, patients will apply the label reading guidelines and determine if the packaged food meet the Pritikin guidelines. The purpose of this lesson is to provide patients with the opportunity to review, discuss, and practice hands-on learning of the Pritikin Label Reading guidelines with actual packaged food labels. Cooking School  Pritikin's [employer] are designed to teach patients ways to prepare quick, simple, and  affordable recipes at home. The importance of nutrition's role in chronic disease risk reduction is reflected in its emphasis in the overall Pritikin program. By learning how to prepare essential core Pritikin Eating Plan recipes, patients will increase control over what they eat; be able to customize the flavor of foods without the use of added salt, sugar, or fat; and improve the quality of the food they consume. By learning a set of core recipes which are easily assembled, quickly prepared, and affordable, patients are more likely to prepare more healthy foods at home. These workshops focus on convenient breakfasts, simple entres, side dishes, and desserts which can be prepared with minimal effort and are consistent with nutrition recommendations for cardiovascular risk reduction. Cooking [employer] are taught by a Armed forces logistics/support/administrative officer (RD) who has been trained by the [employer]. The chef or RD has a clear understanding of the importance of minimizing - if not completely eliminating - added fat, sugar, and sodium in recipes. Throughout the series of Cooking School Workshop sessions, patients will learn about healthy ingredients and efficient methods of cooking to build confidence in their capability to prepare    Cooking School weekly topics:  Adding Flavor- Sodium-Free  Fast and Healthy Breakfasts  Powerhouse Plant-Based Proteins  Satisfying Salads and Dressings  Simple Sides and Sauces  International Cuisine-Spotlight on the [employer] Zones  Delicious Desserts  Savory Soups  [employer] - Meals in  a Snap  Tasty Appetizers and Snacks  Comforting Weekend Breakfasts  One-Pot Wonders   Fast Evening Meals  Landscape architect Your Pritikin Plate  WORKSHOPS   Healthy Mindset (Psychosocial):  Focused Goals, Sustainable Changes Clinical staff led group instruction and group discussion with PowerPoint presentation and patient guidebook. To enhance  the learning environment the use of posters, models and videos may be added. Patients will be able to apply effective goal setting strategies to establish at least one personal goal, and then take consistent, meaningful action toward that goal. They will learn to identify common barriers to achieving personal goals and develop strategies to overcome them. Patients will also gain an understanding of how our mind-set can impact our ability to achieve goals and the importance of cultivating a positive and growth-oriented mind-set. The purpose of this lesson is to provide patients with a deeper understanding of how to set and achieve personal goals, as well as the tools and strategies needed to overcome common obstacles which may arise along the way.  From Head to Heart: The Power of a Healthy Outlook  Clinical staff led group instruction and group discussion with PowerPoint presentation and patient guidebook. To enhance the learning environment the use of posters, models and videos may be added. Patients will be able to recognize and describe the impact of emotions and mood on physical health. They will discover the importance of self-care and explore self-care practices which may work for them. Patients will also learn how to utilize the 4 C's to cultivate a healthier outlook and better manage stress and challenges. The purpose of this lesson is to demonstrate to patients how a healthy outlook is an essential part of maintaining good health, especially as they continue their cardiac rehab journey.  Healthy Sleep for a Healthy Heart Clinical staff led group instruction and group discussion with PowerPoint presentation and patient guidebook. To enhance the learning environment the use of posters, models and videos may be added. At the conclusion of this workshop, patients will be able to demonstrate knowledge of the importance of sleep to overall health, well-being, and quality of life. They will understand the  symptoms of, and treatments for, common sleep disorders. Patients will also be able to identify daytime and nighttime behaviors which impact sleep, and they will be able to apply these tools to help manage sleep-related challenges. The purpose of this lesson is to provide patients with a general overview of sleep and outline the importance of quality sleep. Patients will learn about a few of the most common sleep disorders. Patients will also be introduced to the concept of "sleep hygiene," and discover ways to self-manage certain sleeping problems through simple daily behavior changes. Finally, the workshop will motivate patients by clarifying the links between quality sleep and their goals of heart-healthy living.   Recognizing and Reducing Stress Clinical staff led group instruction and group discussion with PowerPoint presentation and patient guidebook. To enhance the learning environment the use of posters, models and videos may be added. At the conclusion of this workshop, patients will be able to understand the types of stress reactions, differentiate between acute and chronic stress, and recognize the impact that chronic stress has on their health. They will also be able to apply different coping mechanisms, such as reframing negative self-talk. Patients will have the opportunity to practice a variety of stress management techniques, such as deep abdominal breathing, progressive muscle relaxation, and/or guided imagery.  The purpose of this lesson is to educate  patients on the role of stress in their lives and to provide healthy techniques for coping with it.  Learning Barriers/Preferences:  Learning Barriers/Preferences - 02/25/23 1129       Learning Barriers/Preferences   Learning Barriers Sight   glasses   Learning Preferences Group Instruction;Individual Instruction;Skilled Demonstration;Verbal Instruction;Written Material;Video             Education Topics:  Knowledge Questionnaire  Score:  Knowledge Questionnaire Score - 02/25/23 1129       Knowledge Questionnaire Score   Pre Score 28/28             Core Components/Risk Factors/Patient Goals at Admission:  Personal Goals and Risk Factors at Admission - 02/25/23 1130       Core Components/Risk Factors/Patient Goals on Admission    Weight Management Yes;Weight Maintenance    Intervention Weight Management: Develop a combined nutrition and exercise program designed to reach desired caloric intake, while maintaining appropriate intake of nutrient and fiber, sodium and fats, and appropriate energy expenditure required for the weight goal.;Weight Management: Provide education and appropriate resources to help participant work on and attain dietary goals.    Expected Outcomes Short Term: Continue to assess and modify interventions until short term weight is achieved;Long Term: Adherence to nutrition and physical activity/exercise program aimed toward attainment of established weight goal;Weight Maintenance: Understanding of the daily nutrition guidelines, which includes 25-35% calories from fat, 7% or less cal from saturated fats, less than 200mg  cholesterol, less than 1.5gm of sodium, & 5 or more servings of fruits and vegetables daily;Understanding recommendations for meals to include 15-35% energy as protein, 25-35% energy from fat, 35-60% energy from carbohydrates, less than 200mg  of dietary cholesterol, 20-35 gm of total fiber daily;Understanding of distribution of calorie intake throughout the day with the consumption of 4-5 meals/snacks    Tobacco Cessation Yes    Number of packs per day 6 cigarettes/day    Intervention Assist the participant in steps to quit. Provide individualized education and counseling about committing to Tobacco Cessation, relapse prevention, and pharmacological support that can be provided by physician.;Education officer, environmental, assist with locating and accessing local/national Quit Smoking  programs, and support quit date choice.    Expected Outcomes Short Term: Will demonstrate readiness to quit, by selecting a quit date.;Long Term: Complete abstinence from all tobacco products for at least 12 months from quit date.;Short Term: Will quit all tobacco product use, adhering to prevention of relapse plan.    Hypertension Yes    Intervention Provide education on lifestyle modifcations including regular physical activity/exercise, weight management, moderate sodium restriction and increased consumption of fresh fruit, vegetables, and low fat dairy, alcohol moderation, and smoking cessation.;Monitor prescription use compliance.    Expected Outcomes Short Term: Continued assessment and intervention until BP is < 140/43mm HG in hypertensive participants. < 130/38mm HG in hypertensive participants with diabetes, heart failure or chronic kidney disease.;Long Term: Maintenance of blood pressure at goal levels.    Lipids Yes    Intervention Provide education and support for participant on nutrition & aerobic/resistive exercise along with prescribed medications to achieve LDL 70mg , HDL >40mg .    Expected Outcomes Short Term: Participant states understanding of desired cholesterol values and is compliant with medications prescribed. Participant is following exercise prescription and nutrition guidelines.;Long Term: Cholesterol controlled with medications as prescribed, with individualized exercise RX and with personalized nutrition plan. Value goals: LDL < 70mg , HDL > 40 mg.    Stress Yes    Intervention Offer individual and/or  small group education and counseling on adjustment to heart disease, stress management and health-related lifestyle change. Teach and support self-help strategies.;Refer participants experiencing significant psychosocial distress to appropriate mental health specialists for further evaluation and treatment. When possible, include family members and significant others in  education/counseling sessions.    Expected Outcomes Short Term: Participant demonstrates changes in health-related behavior, relaxation and other stress management skills, ability to obtain effective social support, and compliance with psychotropic medications if prescribed.;Long Term: Emotional wellbeing is indicated by absence of clinically significant psychosocial distress or social isolation.             Core Components/Risk Factors/Patient Goals Review:   Goals and Risk Factor Review     Row Name 03/05/23 0910 03/20/23 1153           Core Components/Risk Factors/Patient Goals Review   Personal Goals Review Weight Management/Obesity;Lipids;Stress;Tobacco Cessation;Hypertension Weight Management/Obesity;Lipids;Stress;Tobacco Cessation;Hypertension      Review Inaya started cardiac rehab on 03/05/23. Madie did well with exercise. Vital signs were stable. Anali says she is working on quitting smoking. Ryana started cardiac rehab on 03/05/23. Tayler is off to a good start  with exercise. Vital signs have been stable. Carlin says she is working on quitting smoking. Triglycerides 151 in December, HDL-C 33. Continue dietary modifications.      Expected Outcomes Elmo will continue to participate in cardiac rehab for exercise, nutrtion and lifestyle modifications. Chamara will continue to participate in cardiac rehab for exercise, nutrtion and lifestyle modifications.               Core Components/Risk Factors/Patient Goals at Discharge (Final Review):   Goals and Risk Factor Review - 03/20/23 1153       Core Components/Risk Factors/Patient Goals Review   Personal Goals Review Weight Management/Obesity;Lipids;Stress;Tobacco Cessation;Hypertension    Review Maxx started cardiac rehab on 03/05/23. Caryl is off to a good start  with exercise. Vital signs have been stable. Brinleigh says she is working on quitting smoking. Triglycerides 151 in December, HDL-C 33. Continue  dietary modifications.    Expected Outcomes Phoua will continue to participate in cardiac rehab for exercise, nutrtion and lifestyle modifications.             ITP Comments:  ITP Comments     Row Name 02/25/23 1117 03/05/23 0903 03/20/23 1150       ITP Comments Dr. Armanda Magic medical director. Introduction to pritikin education/ intensive cardiac rehab. Initial orientation packet reviewed with patient. 30 Day ITP Review. Sumiye started cardiac rehab on 03/04/23. Eltha did well with exercise. 30 Day ITP Review. Charliee started cardiac rehab on 03/04/23. Evian is doing  well with exercise as sh is just getting started.              Comments: See ITP comments.Thayer Headings RN BSN

## 2023-03-23 ENCOUNTER — Encounter (HOSPITAL_COMMUNITY): Payer: PPO

## 2023-03-23 DIAGNOSIS — F4323 Adjustment disorder with mixed anxiety and depressed mood: Secondary | ICD-10-CM | POA: Diagnosis not present

## 2023-03-25 ENCOUNTER — Encounter (HOSPITAL_COMMUNITY)
Admission: RE | Admit: 2023-03-25 | Discharge: 2023-03-25 | Disposition: A | Discharge status: Home / Self Care | Payer: PPO | Source: Ambulatory Visit | Attending: Cardiology | Admitting: Cardiology

## 2023-03-25 ENCOUNTER — Encounter (HOSPITAL_COMMUNITY): Payer: PPO

## 2023-03-25 DIAGNOSIS — Z955 Presence of coronary angioplasty implant and graft: Secondary | ICD-10-CM | POA: Insufficient documentation

## 2023-03-25 DIAGNOSIS — I214 Non-ST elevation (NSTEMI) myocardial infarction: Secondary | ICD-10-CM | POA: Diagnosis not present

## 2023-03-25 DIAGNOSIS — M545 Low back pain, unspecified: Secondary | ICD-10-CM | POA: Diagnosis not present

## 2023-03-27 ENCOUNTER — Other Ambulatory Visit: Payer: Self-pay

## 2023-03-27 ENCOUNTER — Other Ambulatory Visit (HOSPITAL_COMMUNITY): Payer: Self-pay

## 2023-03-27 ENCOUNTER — Encounter (HOSPITAL_COMMUNITY)
Admission: RE | Admit: 2023-03-27 | Discharge: 2023-03-27 | Disposition: A | Discharge status: Home / Self Care | Payer: PPO | Source: Ambulatory Visit | Attending: Cardiology | Admitting: Cardiology

## 2023-03-27 ENCOUNTER — Encounter (HOSPITAL_COMMUNITY): Payer: PPO

## 2023-03-27 DIAGNOSIS — M779 Enthesopathy, unspecified: Secondary | ICD-10-CM | POA: Diagnosis not present

## 2023-03-27 DIAGNOSIS — I214 Non-ST elevation (NSTEMI) myocardial infarction: Secondary | ICD-10-CM | POA: Diagnosis not present

## 2023-03-27 DIAGNOSIS — Z955 Presence of coronary angioplasty implant and graft: Secondary | ICD-10-CM

## 2023-03-27 DIAGNOSIS — M25552 Pain in left hip: Secondary | ICD-10-CM | POA: Diagnosis not present

## 2023-03-27 DIAGNOSIS — Z79899 Other long term (current) drug therapy: Secondary | ICD-10-CM | POA: Diagnosis not present

## 2023-03-27 DIAGNOSIS — M542 Cervicalgia: Secondary | ICD-10-CM | POA: Diagnosis not present

## 2023-03-27 DIAGNOSIS — M25551 Pain in right hip: Secondary | ICD-10-CM | POA: Diagnosis not present

## 2023-03-27 MED ORDER — OXYCODONE HCL 5 MG PO TABS
5.0000 mg | ORAL_TABLET | Freq: Three times a day (TID) | ORAL | 0 refills | Status: DC | PRN
  Filled 2023-03-27: qty 15, 5d supply, fill #0

## 2023-03-29 ENCOUNTER — Other Ambulatory Visit (HOSPITAL_COMMUNITY): Payer: Self-pay

## 2023-03-30 ENCOUNTER — Encounter (HOSPITAL_COMMUNITY): Payer: PPO

## 2023-03-30 ENCOUNTER — Other Ambulatory Visit (HOSPITAL_COMMUNITY): Payer: Self-pay

## 2023-03-30 ENCOUNTER — Emergency Department (HOSPITAL_COMMUNITY): Payer: PPO

## 2023-03-30 ENCOUNTER — Other Ambulatory Visit: Payer: Self-pay

## 2023-03-30 ENCOUNTER — Other Ambulatory Visit: Payer: Self-pay | Admitting: Cardiology

## 2023-03-30 ENCOUNTER — Emergency Department (HOSPITAL_COMMUNITY)
Admission: EM | Admit: 2023-03-30 | Discharge: 2023-03-31 | Disposition: A | Discharge status: Home / Self Care | Payer: PPO | Attending: Emergency Medicine | Admitting: Emergency Medicine

## 2023-03-30 ENCOUNTER — Encounter (HOSPITAL_COMMUNITY): Payer: Self-pay

## 2023-03-30 DIAGNOSIS — M542 Cervicalgia: Secondary | ICD-10-CM | POA: Diagnosis not present

## 2023-03-30 DIAGNOSIS — I771 Stricture of artery: Secondary | ICD-10-CM | POA: Diagnosis not present

## 2023-03-30 DIAGNOSIS — I10 Essential (primary) hypertension: Secondary | ICD-10-CM

## 2023-03-30 DIAGNOSIS — R079 Chest pain, unspecified: Secondary | ICD-10-CM | POA: Diagnosis not present

## 2023-03-30 DIAGNOSIS — M4802 Spinal stenosis, cervical region: Secondary | ICD-10-CM | POA: Diagnosis not present

## 2023-03-30 DIAGNOSIS — Z20822 Contact with and (suspected) exposure to covid-19: Secondary | ICD-10-CM | POA: Diagnosis not present

## 2023-03-30 DIAGNOSIS — R519 Headache, unspecified: Secondary | ICD-10-CM | POA: Diagnosis not present

## 2023-03-30 DIAGNOSIS — R0602 Shortness of breath: Secondary | ICD-10-CM | POA: Diagnosis not present

## 2023-03-30 DIAGNOSIS — M47813 Spondylosis without myelopathy or radiculopathy, cervicothoracic region: Secondary | ICD-10-CM | POA: Diagnosis not present

## 2023-03-30 DIAGNOSIS — M47811 Spondylosis without myelopathy or radiculopathy, occipito-atlanto-axial region: Secondary | ICD-10-CM | POA: Diagnosis not present

## 2023-03-30 DIAGNOSIS — I6782 Cerebral ischemia: Secondary | ICD-10-CM | POA: Diagnosis not present

## 2023-03-30 DIAGNOSIS — M47812 Spondylosis without myelopathy or radiculopathy, cervical region: Secondary | ICD-10-CM | POA: Diagnosis not present

## 2023-03-30 DIAGNOSIS — R0789 Other chest pain: Secondary | ICD-10-CM | POA: Diagnosis not present

## 2023-03-30 LAB — BASIC METABOLIC PANEL
Anion gap: 13 (ref 5–15)
BUN: 14 mg/dL (ref 8–23)
CO2: 21 mmol/L — ABNORMAL LOW (ref 22–32)
Calcium: 9.4 mg/dL (ref 8.9–10.3)
Chloride: 103 mmol/L (ref 98–111)
Creatinine, Ser: 0.7 mg/dL (ref 0.44–1.00)
GFR, Estimated: >60 mL/min (ref >60)
Glucose, Bld: 128 mg/dL — ABNORMAL HIGH (ref 70–99)
Potassium: 4.1 mmol/L (ref 3.5–5.1)
Sodium: 137 mmol/L (ref 135–145)

## 2023-03-30 LAB — TROPONIN I (HIGH SENSITIVITY)
Troponin I (High Sensitivity): 13 ng/L (ref <18)
Troponin I (High Sensitivity): 14 ng/L (ref <18)

## 2023-03-30 LAB — RESP PANEL BY RT-PCR (RSV, FLU A&B, COVID)  RVPGX2
Influenza A by PCR: NEGATIVE
Influenza B by PCR: NEGATIVE
Resp Syncytial Virus by PCR: NEGATIVE
SARS Coronavirus 2 by RT PCR: NEGATIVE

## 2023-03-30 LAB — CBC
HCT: 38 % (ref 36.0–46.0)
Hemoglobin: 12.9 g/dL (ref 12.0–15.0)
MCH: 30.3 pg (ref 26.0–34.0)
MCHC: 33.9 g/dL (ref 30.0–36.0)
MCV: 89.2 fL (ref 80.0–100.0)
Platelets: 292 10*3/uL (ref 150–400)
RBC: 4.26 MIL/uL (ref 3.87–5.11)
RDW: 12 % (ref 11.5–15.5)
WBC: 13.9 10*3/uL — ABNORMAL HIGH (ref 4.0–10.5)
nRBC: 0 % (ref 0.0–0.2)

## 2023-03-30 LAB — BRAIN NATRIURETIC PEPTIDE: B Natriuretic Peptide: 136.8 pg/mL — ABNORMAL HIGH (ref 0.0–100.0)

## 2023-03-30 MED ORDER — HYDROMORPHONE HCL 1 MG/ML IJ SOLN
1.0000 mg | Freq: Once | INTRAMUSCULAR | Status: AC
  Administered 2023-03-30: 1 mg via INTRAVENOUS
  Filled 2023-03-30: qty 1

## 2023-03-30 MED ORDER — MELOXICAM 15 MG PO TABS
15.0000 mg | ORAL_TABLET | Freq: Every day | ORAL | 0 refills | Status: DC
Start: 2023-03-30 — End: 2023-05-15
  Filled 2023-03-30: qty 7, 7d supply, fill #0

## 2023-03-30 MED ORDER — PREDNISONE 20 MG PO TABS
40.0000 mg | ORAL_TABLET | Freq: Every day | ORAL | 0 refills | Status: DC
  Filled 2023-03-30: qty 10, 5d supply, fill #0

## 2023-03-30 MED ORDER — HYDROMORPHONE HCL 1 MG/ML IJ SOLN
0.5000 mg | Freq: Once | INTRAMUSCULAR | Status: AC
  Administered 2023-03-31: 0.5 mg via INTRAVENOUS
  Filled 2023-03-30: qty 1

## 2023-03-30 MED ORDER — TRAMADOL HCL 50 MG PO TABS
50.0000 mg | ORAL_TABLET | Freq: Three times a day (TID) | ORAL | 0 refills | Status: DC | PRN
  Filled 2023-03-30: qty 15, 5d supply, fill #0

## 2023-03-30 NOTE — ED Triage Notes (Addendum)
 Patient complaining of chest pain, back pain, and neck pain starting yesterday. Pain worse with palpation. Denies numbness, n/v, or change in vision. Hx of MI in December.

## 2023-03-30 NOTE — ED Provider Triage Note (Signed)
 Emergency Medicine Provider Triage Evaluation Note  Amanda Kim Patient , a 78 y.o. female  was evaluated in triage.  Pt complains of severe chest pain that does not radiate for 1 day.  Reports it feels like chest tightness.  Hurts slightly worse when taking deep breath then and with palpation.  It is associated with shortness of breath.  Patient recently had stent placement for NSTEMI.  Reports feels similar to prior cardiac episode.  Review of Systems  Positive: Chest pain Negative: Change in vision blurry vision, unilateral muscle weakness or change in sensation, nausea vomiting diarrhea  Physical Exam  BP (!) 149/71 (BP Location: Right Arm)   Pulse 72   Temp 98.1 F (36.7 C) (Oral)   Resp (!) 22   Ht 5\' 4"  (1.626 m)   Wt 57.6 kg   SpO2 98%   BMI 21.80 kg/m  Gen:   Awake, no distress, appears in pain holding chest Resp:  Normal effort  MSK:   Moves extremities without difficulty  Other:    Medical Decision Making  Medically screening exam initiated at 7:50 PM.  Appropriate orders placed.  Amanda Kim was informed that the remainder of the evaluation will be completed by another provider, this initial triage assessment does not replace that evaluation, and the importance of remaining in the ED until their evaluation is complete.     Amanda Kim, [place] 03/30/23 3244

## 2023-03-30 NOTE — ED Provider Notes (Signed)
[facility] Emergency Department Provider Note MRN:  [id number]  Arrival date & time: 03/31/23     Chief Complaint   Chest Pain   History of Present Illness   Amanda Kim is a 78 y.o. year-old female presents to the ED with chief complaint of neck pain and headache.  Onset was Saturday night (2 days ago).  Contrary to triage note, patient states that her pain is in her neck and radiates into her arms and upper chest.  She states that she has associated headache.  She states that the majority of her pain is in the back of the neck.  It is worsened with palpation.  It is worsened when she moves her head side to side.  She states that she has severe arthritis, but had to stop taking all of her NSAIDs because of a recent NSTEMI in December.  She denies numbness, weakness, tingling.  Denies fever, chills, cough, n/v/d.  She states that she has taken her home oxycodone (prescribed by pain management), and Tylenol and a muscle relaxer without relief.  She states that her neck feels like it is unstable and like her head is going to roll off her shoulders.  History provided by patient.   Review of Systems  Pertinent positive and negative review of systems noted in HPI.    Physical Exam   Vitals:   03/30/23 2330 03/30/23 2345  BP: 129/70 133/71  Pulse: 71 78  Resp: 15 16  Temp:    SpO2: 94% 97%    CONSTITUTIONAL:  non toxic-appearing, NAD NEURO:  Alert and oriented x 3, CN 3-12 grossly intact EYES:  eyes equal and reactive ENT/NECK:  Supple, no stridor  CARDIO:  normal rate, regular rhythm, appears well-perfused  PULM:  No respiratory distress, CTAB GI/GU:  non-distended, no focal tenderness MSK/SPINE:  No gross deformities, no edema, moves all extremities  SKIN:  no rash, atraumatic   *Additional and/or pertinent findings included in MDM below  Diagnostic and Interventional Summary    EKG Interpretation Date/Time:  Monday March 30 2023  19:53:08 EST Ventricular Rate:  60 PR Interval:  164 QRS Duration:  62 QT Interval:  404 QTC Calculation: 404 R Axis:   -15  Text Interpretation: Normal sinus rhythm Low voltage QRS Inferior infarct , age undetermined Cannot rule out Anterior infarct , age undetermined Abnormal ECG When compared with ECG of 07-Feb-2023 07:14, PREVIOUS ECG IS PRESENT Confirmed by Ernie Avena ([id number]) on 03/30/2023 11:49:36 PM       Labs Reviewed  BASIC METABOLIC PANEL - Abnormal; Notable for the following components:      Result Value   CO2 21 (*)    Glucose, Bld 128 (*)    All other components within normal limits  CBC - Abnormal; Notable for the following components:   WBC 13.9 (*)    All other components within normal limits  BRAIN NATRIURETIC PEPTIDE - Abnormal; Notable for the following components:   B Natriuretic Peptide 136.8 (*)    All other components within normal limits  RESP PANEL BY RT-PCR (RSV, FLU A&B, COVID)  [id number]  TROPONIN I (HIGH SENSITIVITY)  TROPONIN I (HIGH SENSITIVITY)    CT HEAD WO CONTRAST ( )  Final Result    CT Cervical Spine Wo Contrast  Final Result    DG Chest 2 View  Final Result      Medications  HYDROmorphone (DILAUDID) injection 1 mg (1 mg Intravenous Given 03/30/23 2223)  HYDROmorphone (DILAUDID) injection  0.5 mg (0.5 mg Intravenous Given 03/31/23 0020)     Procedures  /  Critical Care Procedures  ED Course and Medical Decision Making  I have reviewed the triage vital signs, the nursing notes, and pertinent available records from the EMR.  Social Determinants Affecting Complexity of Care: Patient has no clinically significant social determinants affecting this chief complaint..   ED Course:    Medical Decision Making Patient here with neck pain and headache.  She states the pain radiates into her shoulders.  She notes that she had recent NSTEMI.  She states that she was taken off of her anti-inflammatories, and that she has significant pain  from arthritis.  She is managed by pain management.  Her symptoms are reproducible with palpation of the neck and with movement of the head.  I suspect that this is degenerative changes in the spine.  Will check CT.  Her symptoms do not seem to be related to chest pain or shortness of breath.  Her visit today is for the neck pain and headache.  Troponins and chest pain workup was ordered in triage, but her presentation is not consistent with ACS.  Her troponins are flat.  She does not have any acute ischemic EKG changes.  CT demonstrates no acute or traumatic findings.  There is spondylosis, osteophytes, and facet arthropathy of the cervical vertebrae.  It is possible that she could have some neural compression at C5/6.  Patient has normal strength in bilateral upper extremities.  I will have her follow-up with neurosurgery.  She has seen Dr. Danielle Dess in the past.  I will start her on some prednisone.  I have advised her to also follow-up with her pain management doctor.  I will give her a cervical soft pillow for comfort.  She is agreeable with plan.  She appears stable for discharge.  Amount and/or Complexity of Data Reviewed Labs: ordered. Radiology: ordered.  Risk Prescription drug management.         Consultants: No consultations were needed in caring for this patient.   Treatment and Plan: I considered admission due to patient's initial presentation, but after considering the examination and diagnostic results, patient will not require admission and can be discharged with outpatient follow-up.  Patient discussed with attending physician, Dr. Andria Meuse, who agrees with plan for non-con CTs.  Final Clinical Impressions(s) / ED Diagnoses     ICD-10-CM   1. Neck pain  M54.2       ED Discharge Orders          Ordered    predniSONE (DELTASONE) 20 MG tablet  Daily        03/30/23 2357              Discharge Instructions Discussed with and Provided to Patient:      Discharge Instructions      1. No acute or traumatic finding. Straightening of the normal  cervical lordosis with mild kyphotic curvature. 2 mm of degenerative  anterolisthesis at C3-4 and C7-T1.  2. C3-4: Facet arthropathy with 2 mm of degenerative  anterolisthesis. Bilateral foraminal narrowing that could affect  either C4 nerve.  3. C4-5: Spondylosis with endplate osteophytes. Mild bilateral  foraminal narrowing.  4. C5-6: Spondylosis with endplate osteophytes. Foraminal narrowing  worse on the left than the right. Neural compression could occur at  this level, particularly on the left.  5. C6-7: Endplate and uncovertebral osteophytes. Bilateral foraminal  narrowing that could affect either C7 nerve.  Roxy Horseman, PA-C 03/31/23 0021    Anders Simmonds T, DO 03/31/23 2325

## 2023-03-30 NOTE — Discharge Instructions (Signed)
 1. No acute or traumatic finding. Straightening of the normal  cervical lordosis with mild kyphotic curvature. 2 mm of degenerative  anterolisthesis at C3-4 and C7-T1.  2. C3-4: Facet arthropathy with 2 mm of degenerative  anterolisthesis. Bilateral foraminal narrowing that could affect  either C4 nerve.  3. C4-5: Spondylosis with endplate osteophytes. Mild bilateral  foraminal narrowing.  4. C5-6: Spondylosis with endplate osteophytes. Foraminal narrowing  worse on the left than the right. Neural compression could occur at  this level, particularly on the left.  5. C6-7: Endplate and uncovertebral osteophytes. Bilateral foraminal  narrowing that could affect either C7 nerve.

## 2023-03-31 ENCOUNTER — Other Ambulatory Visit (HOSPITAL_COMMUNITY): Payer: Self-pay

## 2023-03-31 ENCOUNTER — Other Ambulatory Visit: Payer: Self-pay

## 2023-03-31 MED ORDER — POTASSIUM CHLORIDE ER 10 MEQ PO CPCR
10.0000 meq | ORAL_CAPSULE | Freq: Two times a day (BID) | ORAL | 3 refills | Status: DC
  Filled 2023-03-31 – 2023-04-06 (×3): qty 180, 90d supply, fill #0

## 2023-03-31 NOTE — Progress Notes (Signed)
Orthopedic Tech Progress Note Patient Details:  Amanda Kim [facility] [birth year] [id number] Applied soft cervical collar per order.  Ortho Devices Type of Ortho Device: Soft collar Ortho Device/Splint Location: neck Ortho Device/Splint Interventions: Ordered, Application, Adjustment   Post Interventions Patient Tolerated: Well Instructions Provided: Adjustment of device, Care of device  Blase Mess 03/31/2023, 12:57 AM

## 2023-03-31 NOTE — ED Notes (Signed)
Orthopedic tech  called by secretary for patient's soft collar .

## 2023-04-01 ENCOUNTER — Other Ambulatory Visit (HOSPITAL_COMMUNITY): Payer: Self-pay

## 2023-04-01 ENCOUNTER — Encounter (HOSPITAL_COMMUNITY): Payer: PPO

## 2023-04-01 ENCOUNTER — Encounter (HOSPITAL_COMMUNITY): Admission: RE | Admit: 2023-04-01 | Payer: PPO | Source: Ambulatory Visit

## 2023-04-01 DIAGNOSIS — Z79899 Other long term (current) drug therapy: Secondary | ICD-10-CM | POA: Diagnosis not present

## 2023-04-01 MED ORDER — BUPROPION HCL ER (SR) 150 MG PO TB12
150.0000 mg | ORAL_TABLET | Freq: Two times a day (BID) | ORAL | 1 refills | Status: DC
  Filled 2023-04-01: qty 180, 90d supply, fill #0
  Filled 2023-07-09: qty 180, 90d supply, fill #1

## 2023-04-02 ENCOUNTER — Encounter (HOSPITAL_COMMUNITY): Payer: Self-pay

## 2023-04-02 ENCOUNTER — Telehealth (HOSPITAL_COMMUNITY): Payer: Self-pay | Admitting: *Deleted

## 2023-04-02 NOTE — Telephone Encounter (Signed)
Left message to call cardiac rehab.Thayer Headings RN BSN

## 2023-04-02 NOTE — Telephone Encounter (Signed)
Spoke with Amanda Kim,  Will hold exercise at cardiac rehab until [facility] get's provider clearance to return. Laronica says she is feeling much better. Aryahi says she will let us know when she obtains an appointment with the neurosurgeon. Will cancel appointments until next Wednesday. Thayer Headings RN BSN

## 2023-04-03 ENCOUNTER — Other Ambulatory Visit (HOSPITAL_COMMUNITY): Payer: Self-pay

## 2023-04-03 ENCOUNTER — Encounter (HOSPITAL_COMMUNITY): Payer: PPO

## 2023-04-06 ENCOUNTER — Other Ambulatory Visit (HOSPITAL_COMMUNITY): Payer: Self-pay

## 2023-04-06 ENCOUNTER — Other Ambulatory Visit: Payer: Self-pay

## 2023-04-06 ENCOUNTER — Encounter: Payer: PPO | Admitting: Licensed Clinical Social Worker

## 2023-04-06 ENCOUNTER — Encounter (HOSPITAL_COMMUNITY): Payer: PPO

## 2023-04-06 DIAGNOSIS — M5030 Other cervical disc degeneration, unspecified cervical region: Secondary | ICD-10-CM | POA: Diagnosis not present

## 2023-04-06 DIAGNOSIS — Z79899 Other long term (current) drug therapy: Secondary | ICD-10-CM | POA: Diagnosis not present

## 2023-04-06 DIAGNOSIS — F4323 Adjustment disorder with mixed anxiety and depressed mood: Secondary | ICD-10-CM | POA: Diagnosis not present

## 2023-04-06 DIAGNOSIS — M2578 Osteophyte, vertebrae: Secondary | ICD-10-CM | POA: Diagnosis not present

## 2023-04-06 DIAGNOSIS — M129 Arthropathy, unspecified: Secondary | ICD-10-CM | POA: Diagnosis not present

## 2023-04-06 DIAGNOSIS — G8929 Other chronic pain: Secondary | ICD-10-CM | POA: Diagnosis not present

## 2023-04-06 DIAGNOSIS — M7072 Other bursitis of hip, left hip: Secondary | ICD-10-CM | POA: Diagnosis not present

## 2023-04-06 DIAGNOSIS — M545 Low back pain, unspecified: Secondary | ICD-10-CM | POA: Diagnosis not present

## 2023-04-06 DIAGNOSIS — M7071 Other bursitis of hip, right hip: Secondary | ICD-10-CM | POA: Diagnosis not present

## 2023-04-06 MED ORDER — OXYCODONE-ACETAMINOPHEN 5-325 MG PO TABS
1.0000 | ORAL_TABLET | Freq: Three times a day (TID) | ORAL | 0 refills | Status: DC | PRN
  Filled 2023-04-06: qty 57, 19d supply, fill #0

## 2023-04-06 MED ORDER — DULOXETINE HCL 60 MG PO CPEP
60.0000 mg | ORAL_CAPSULE | Freq: Every day | ORAL | 1 refills | Status: DC
  Filled 2023-04-06: qty 90, 90d supply, fill #0
  Filled 2023-06-23: qty 90, 90d supply, fill #1

## 2023-04-06 MED ORDER — LOSARTAN POTASSIUM 100 MG PO TABS
100.0000 mg | ORAL_TABLET | Freq: Every evening | ORAL | 1 refills | Status: AC
Start: 2023-04-06 — End: ?
  Filled 2023-05-12: qty 90, 90d supply, fill #0

## 2023-04-06 MED ORDER — NALOXONE HCL 4 MG/0.1ML NA LIQD
1.0000 | NASAL | 0 refills | Status: DC | PRN
  Filled 2023-04-06: qty 2, 1d supply, fill #0

## 2023-04-08 ENCOUNTER — Encounter (HOSPITAL_COMMUNITY): Payer: PPO

## 2023-04-10 ENCOUNTER — Encounter (HOSPITAL_COMMUNITY): Payer: PPO

## 2023-04-10 ENCOUNTER — Other Ambulatory Visit (HOSPITAL_COMMUNITY): Payer: Self-pay

## 2023-04-12 MED ORDER — PRASUGREL HCL 10 MG PO TABS
10.0000 mg | ORAL_TABLET | Freq: Every day | ORAL | 3 refills | Status: DC
  Filled 2023-04-12: qty 90, 90d supply, fill #0
  Filled 2023-07-13: qty 90, 90d supply, fill #1
  Filled 2023-10-08: qty 90, 90d supply, fill #2

## 2023-04-12 NOTE — Addendum Note (Signed)
 Addended by: Delrae Rend on: 04/12/2023 08:09 PM   Modules accepted: Orders

## 2023-04-12 NOTE — Telephone Encounter (Signed)
 ICD-10-CM   1. Coronary artery disease of native artery of native heart with stable angina pectoris (HCC)  I25.118 prasugrel (EFFIENT) 10 MG TABS tablet     Meds ordered this encounter  Medications   DISCONTD: potassium chloride (MICRO-K) 10 MEQ CR capsule    Sig: Take 1 capsule (10 mEq total) by mouth 2 (two) times daily.    Dispense:  60 capsule    Refill:  2   prasugrel (EFFIENT) 10 MG TABS tablet    Sig: Take 1 tablet (10 mg total) by mouth daily.    Dispense:  90 tablet    Refill:  3    Discontinue Brilinta

## 2023-04-13 ENCOUNTER — Encounter (HOSPITAL_COMMUNITY): Payer: PPO

## 2023-04-13 ENCOUNTER — Other Ambulatory Visit (HOSPITAL_COMMUNITY): Payer: Self-pay

## 2023-04-13 MED ORDER — GABAPENTIN 100 MG PO CAPS
ORAL_CAPSULE | ORAL | 1 refills | Status: DC
  Filled 2023-04-13: qty 630, 90d supply, fill #0

## 2023-04-15 ENCOUNTER — Encounter (HOSPITAL_COMMUNITY): Payer: PPO

## 2023-04-15 ENCOUNTER — Other Ambulatory Visit (HOSPITAL_COMMUNITY): Payer: Self-pay

## 2023-04-16 ENCOUNTER — Ambulatory Visit
Admission: RE | Admit: 2023-04-16 | Discharge: 2023-04-16 | Disposition: A | Discharge status: Home / Self Care | Payer: PPO | Source: Ambulatory Visit | Attending: Internal Medicine | Admitting: Internal Medicine

## 2023-04-16 DIAGNOSIS — M810 Age-related osteoporosis without current pathological fracture: Secondary | ICD-10-CM | POA: Diagnosis not present

## 2023-04-16 DIAGNOSIS — F17210 Nicotine dependence, cigarettes, uncomplicated: Secondary | ICD-10-CM

## 2023-04-16 DIAGNOSIS — Z122 Encounter for screening for malignant neoplasm of respiratory organs: Secondary | ICD-10-CM | POA: Diagnosis not present

## 2023-04-16 DIAGNOSIS — Z87891 Personal history of nicotine dependence: Secondary | ICD-10-CM | POA: Diagnosis not present

## 2023-04-17 ENCOUNTER — Encounter (HOSPITAL_COMMUNITY): Payer: PPO

## 2023-04-17 ENCOUNTER — Telehealth (HOSPITAL_COMMUNITY): Payer: Self-pay | Admitting: *Deleted

## 2023-04-17 NOTE — Telephone Encounter (Signed)
 Left message to call cardiac rehab. Called to check on progress and when and if [place] plans to return to exercise at cardiac rehab.Thayer Headings RN BSN

## 2023-04-20 ENCOUNTER — Encounter (HOSPITAL_COMMUNITY): Payer: PPO

## 2023-04-20 ENCOUNTER — Telehealth (HOSPITAL_COMMUNITY): Payer: Self-pay | Admitting: *Deleted

## 2023-04-20 ENCOUNTER — Encounter (HOSPITAL_COMMUNITY): Payer: Self-pay | Admitting: *Deleted

## 2023-04-20 ENCOUNTER — Other Ambulatory Visit (HOSPITAL_COMMUNITY): Payer: Self-pay

## 2023-04-20 DIAGNOSIS — I214 Non-ST elevation (NSTEMI) myocardial infarction: Secondary | ICD-10-CM

## 2023-04-20 DIAGNOSIS — F4323 Adjustment disorder with mixed anxiety and depressed mood: Secondary | ICD-10-CM | POA: Diagnosis not present

## 2023-04-20 DIAGNOSIS — Z955 Presence of coronary angioplasty implant and graft: Secondary | ICD-10-CM

## 2023-04-20 MED ORDER — METOPROLOL SUCCINATE ER 25 MG PO TB24
25.0000 mg | ORAL_TABLET | Freq: Every day | ORAL | 2 refills | Status: DC
  Filled 2023-04-20 – 2023-04-22 (×2): qty 90, 90d supply, fill #0
  Filled 2023-05-12 – 2023-07-08 (×3): qty 90, 90d supply, fill #1

## 2023-04-20 NOTE — Progress Notes (Signed)
 Discharge Progress Report  Patient Details  Name: Mercedez Boule MRN: [id number] Date of Birth: 19-Mar-1945 Referring Provider:   Flowsheet Row INTENSIVE CARDIAC REHAB ORIENT from 02/25/2023 in [facility]  Referring Provider Yates Decamp, MD        Number of Visits: 14  Reason for Discharge:  Early Exit:  medical issues  Smoking History:  Social History   Tobacco Use  Smoking Status Every Day   Current packs/day: 0.80   Average packs/day: 0.8 packs/day for 45.0 years (36.0 ttl pk-yrs)   Types: Cigarettes  Smokeless Tobacco Never  Tobacco Comments   Using quitline [place] for cessation counseling, down to 6 cigarettes/day.     Diagnosis:  02/06/23 NSTEMI (non-ST elevated myocardial infarction) (HCC)  02/06/23 DES LAD and LCX  ADL UCSD:   Initial Exercise Prescription:  Initial Exercise Prescription - 02/25/23 1300       Date of Initial Exercise RX and Referring Provider   Date 02/25/23    Referring Provider Yates Decamp, MD    Expected Discharge Date 05/20/23      Treadmill   MPH 2.8    Grade 0    Minutes 15    METs 3.17      NuStep   Level 1    SPM 75    Minutes 15    METs 2.5      Prescription Details   Frequency (times per week) 3    Duration Progress to 30 minutes of continuous aerobic without signs/symptoms of physical distress      Intensity   THRR 40-80% of Max Heartrate 58-115    Ratings of Perceived Exertion 11-13    Perceived Dyspnea 0-4      Progression   Progression Continue progressive overload as per policy without signs/symptoms or physical distress.      Resistance Training   Training Prescription Yes    Weight 2    Reps 10-15             Discharge Exercise Prescription (Final Exercise Prescription Changes):  Exercise Prescription Changes - 03/04/23 1621       Response to Exercise   Blood Pressure (Admit) 120/58    Blood Pressure (Exercise) 132/74    Blood  Pressure (Exit) 98/54    Heart Rate (Admit) 55 bpm    Heart Rate (Exercise) 110 bpm    Heart Rate (Exit) 63 bpm    Rating of Perceived Exertion (Exercise) 12    Perceived Dyspnea (Exercise) 0    Symptoms 0    Comments Pt first day in the [facility] program    Duration Progress to 30 minutes of  aerobic without signs/symptoms of physical distress    Intensity THRR unchanged      Progression   Progression Continue to progress workloads to maintain intensity without signs/symptoms of physical distress.    Average METs 2.27      Resistance Training   Training Prescription No    Weight 2    Reps 10-15    Time 10 Minutes      Treadmill   MPH 2.8    Grade 0    Minutes 15    METs 3.14      NuStep   Level 1    SPM 51    Minutes 15    METs 1.4             Functional Capacity:  6 Minute Walk  Row Name 02/25/23 1331         6 Minute Walk   Phase Initial     Distance 1680 feet     Walk Time 6 minutes     # of Rest Breaks 0     MPH 3.18     METS 3.21     RPE 11     Perceived Dyspnea  0     VO2 Peak 11.24     Symptoms Yes (comment)     Comments Bilateral hip pain 4/10, resolved with rest     Resting HR 50 bpm     Resting BP 98/64     Resting Oxygen Saturation  95 %     Exercise Oxygen Saturation  during 6 min walk 95 %     Max Ex. HR 82 bpm     Max Ex. BP 118/66     2 Minute Post BP 104/64              Psychological, QOL, Others - Outcomes: PHQ 2/9:    02/25/2023   11:32 AM 02/20/2023   10:52 AM 09/23/2017   10:20 AM 03/21/2014    2:34 PM 08/31/2013    3:37 PM  Depression screen PHQ 2/9  Decreased Interest 2 1 0 0 0  Down, Depressed, Hopeless 1 0 0 0 0  PHQ - 2 Score 3 1 0 0 0  Altered sleeping 1   1   Tired, decreased energy 3   1   Change in appetite 0   0   Feeling bad or failure about yourself  0   0   Trouble concentrating 0   0   Moving slowly or fidgety/restless 0   0   Suicidal thoughts 0   0   PHQ-9 Score 7   2   Difficult doing  work/chores Somewhat difficult        Quality of Life:  Quality of Life - 02/25/23 1333       Quality of Life   Select Quality of Life      Quality of Life Scores   Health/Function Pre 11.37 %    Socioeconomic Pre 23.06 %    Psych/Spiritual Pre 20.93 %    Family Pre 6.5 %    GLOBAL Pre 15.51 %             Personal Goals: Goals established at orientation with interventions provided to work toward goal.  Personal Goals and Risk Factors at Admission - 02/25/23 1130       Core Components/Risk Factors/Patient Goals on Admission    Weight Management Yes;Weight Maintenance    Intervention Weight Management: Develop a combined nutrition and exercise program designed to reach desired caloric intake, while maintaining appropriate intake of nutrient and fiber, sodium and fats, and appropriate energy expenditure required for the weight goal.;Weight Management: Provide education and appropriate resources to help participant work on and attain dietary goals.    Expected Outcomes Short Term: Continue to assess and modify interventions until short term weight is achieved;Long Term: Adherence to nutrition and physical activity/exercise program aimed toward attainment of established weight goal;Weight Maintenance: Understanding of the daily nutrition guidelines, which includes 25-35% calories from fat, 7% or less cal from saturated fats, less than 200mg  cholesterol, less than 1.5gm of sodium, & 5 or more servings of fruits and vegetables daily;Understanding recommendations for meals to include 15-35% energy as protein, 25-35% energy from fat, 35-60% energy from carbohydrates, less than 200mg  of  dietary cholesterol, 20-35 gm of total fiber daily;Understanding of distribution of calorie intake throughout the day with the consumption of 4-5 meals/snacks    Tobacco Cessation Yes    Number of packs per day 6 cigarettes/day    Intervention Assist the participant in steps to quit. Provide individualized  education and counseling about committing to Tobacco Cessation, relapse prevention, and pharmacological support that can be provided by physician.;Education officer, environmental, assist with locating and accessing local/national Quit Smoking programs, and support quit date choice.    Expected Outcomes Short Term: Will demonstrate readiness to quit, by selecting a quit date.;Long Term: Complete abstinence from all tobacco products for at least 12 months from quit date.;Short Term: Will quit all tobacco product use, adhering to prevention of relapse plan.    Hypertension Yes    Intervention Provide education on lifestyle modifcations including regular physical activity/exercise, weight management, moderate sodium restriction and increased consumption of fresh fruit, vegetables, and low fat dairy, alcohol moderation, and smoking cessation.;Monitor prescription use compliance.    Expected Outcomes Short Term: Continued assessment and intervention until BP is < 140/37mm HG in hypertensive participants. < 130/45mm HG in hypertensive participants with diabetes, heart failure or chronic kidney disease.;Long Term: Maintenance of blood pressure at goal levels.    Lipids Yes    Intervention Provide education and support for participant on nutrition & aerobic/resistive exercise along with prescribed medications to achieve LDL 70mg , HDL >40mg .    Expected Outcomes Short Term: Participant states understanding of desired cholesterol values and is compliant with medications prescribed. Participant is following exercise prescription and nutrition guidelines.;Long Term: Cholesterol controlled with medications as prescribed, with individualized exercise RX and with personalized nutrition plan. Value goals: LDL < 70mg , HDL > 40 mg.    Stress Yes    Intervention Offer individual and/or small group education and counseling on adjustment to heart disease, stress management and health-related lifestyle change. Teach and support  self-help strategies.;Refer participants experiencing significant psychosocial distress to appropriate mental health specialists for further evaluation and treatment. When possible, include family members and significant others in education/counseling sessions.    Expected Outcomes Short Term: Participant demonstrates changes in health-related behavior, relaxation and other stress management skills, ability to obtain effective social support, and compliance with psychotropic medications if prescribed.;Long Term: Emotional wellbeing is indicated by absence of clinically significant psychosocial distress or social isolation.              Personal Goals Discharge:  Goals and Risk Factor Review     Row Name 03/05/23 0910 03/20/23 1153 04/20/23 0858         Core Components/Risk Factors/Patient Goals Review   Personal Goals Review Weight Management/Obesity;Lipids;Stress;Tobacco Cessation;Hypertension Weight Management/Obesity;Lipids;Stress;Tobacco Cessation;Hypertension Weight Management/Obesity;Lipids;Stress;Tobacco Cessation;Hypertension     Review Patt started cardiac rehab on 03/05/23. Albertha did well with exercise. Vital signs were stable. Alyne says she is working on quitting smoking. Kaiden started cardiac rehab on 03/05/23. Kaiesha is off to a good start  with exercise. Vital signs have been stable. Milessa says she is working on quitting smoking. Triglycerides 151 in December, HDL-C 33. Continue dietary modifications. Lakeitha did not reuturn to exercise at cardiac rehab and has been discharged as requested     Expected Outcomes Cornesha will continue to participate in cardiac rehab for exercise, nutrtion and lifestyle modifications. Quaniya will continue to participate in cardiac rehab for exercise, nutrtion and lifestyle modifications. Ninette will continue to exercise, follow  nutrtion and lifestyle modifications.  Exercise Goals and Review:  Exercise Goals      Row Name 02/25/23 1122             Exercise Goals   Increase Physical Activity Yes       Intervention Provide advice, education, support and counseling about physical activity/exercise needs.;Develop an individualized exercise prescription for aerobic and resistive training based on initial evaluation findings, risk stratification, comorbidities and participant's personal goals.       Expected Outcomes Short Term: Attend rehab on a regular basis to increase amount of physical activity.;Long Term: Exercising regularly at least 3-5 days a week.;Long Term: Add in home exercise to make exercise part of routine and to increase amount of physical activity.       Increase Strength and Stamina Yes       Intervention Provide advice, education, support and counseling about physical activity/exercise needs.;Develop an individualized exercise prescription for aerobic and resistive training based on initial evaluation findings, risk stratification, comorbidities and participant's personal goals.       Expected Outcomes Short Term: Increase workloads from initial exercise prescription for resistance, speed, and METs.;Short Term: Perform resistance training exercises routinely during rehab and add in resistance training at home;Long Term: Improve cardiorespiratory fitness, muscular endurance and strength as measured by increased METs and functional capacity ( )       Able to understand and use rate of perceived exertion (RPE) scale Yes       Intervention Provide education and explanation on how to use RPE scale       Expected Outcomes Short Term: Able to use RPE daily in rehab to express subjective intensity level;Long Term:  Able to use RPE to guide intensity level when exercising independently       Knowledge and understanding of Target Heart Rate Range (THRR) Yes       Intervention Provide education and explanation of THRR including how the numbers were predicted and where they are located for reference        Expected Outcomes Short Term: Able to state/look up THRR;Short Term: Able to use daily as guideline for intensity in rehab;Long Term: Able to use THRR to govern intensity when exercising independently       Understanding of Exercise Prescription Yes       Intervention Provide education, explanation, and written materials on patient's individual exercise prescription       Expected Outcomes Short Term: Able to explain program exercise prescription;Long Term: Able to explain home exercise prescription to exercise independently                Exercise Goals Re-Evaluation:  Exercise Goals Re-Evaluation     Row Name 03/04/23 1625             Exercise Goal Re-Evaluation   Exercise Goals Review Increase Physical Activity;Understanding of Exercise Prescription;Increase Strength and Stamina;Knowledge and understanding of Target Heart Rate Range (THRR);Able to understand and use rate of perceived exertion (RPE) scale       Comments Pt first day in the [facility] program. Pt tolerated exercise well with an average MET level of 2.27. Pt is off to a good start and is learning her THRR, RPE and ExRx       Expected Outcomes Will continue to monitor pt and progress workloads as tolerated without sign or symptom                Nutrition & Weight - Outcomes:  Pre Biometrics - 02/25/23 1117  Pre Biometrics   Waist Circumference 34 inches    Hip Circumference 36.5 inches    Waist to Hip Ratio 0.93 %    Triceps Skinfold 18 mm    % Body Fat 34.1 %    Grip Strength 28 kg    Flexibility --   not done, low back pain   Single Leg Stand 26.4 seconds              Nutrition:   Nutrition Discharge:   Education Questionnaire Score:  Knowledge Questionnaire Score - 02/25/23 1129       Knowledge Questionnaire Score   Pre Score 28/28             Jazara attended 15 exercise sessions between 02/25/23- 03/27/23. Corliis did well with exercise when in attendance. Shamekia  has had problems with pain management and has asked to be discharged from cardiac rehab at this time, Sanam may decided to return to complete the program at a later date and time.Thayer Headings RN BSN

## 2023-04-20 NOTE — Addendum Note (Signed)
 Addended by: Dossie Arbour on: 04/20/2023 08:59 AM   Modules accepted: Orders

## 2023-04-20 NOTE — Telephone Encounter (Signed)
 Spoke with Fizza she does not plan to return to cardiac rehab at this time. Will discharge as requested. Thayer Headings RN BSN

## 2023-04-22 ENCOUNTER — Encounter (HOSPITAL_COMMUNITY): Payer: PPO

## 2023-04-22 ENCOUNTER — Other Ambulatory Visit (HOSPITAL_COMMUNITY): Payer: Self-pay

## 2023-04-22 ENCOUNTER — Other Ambulatory Visit: Payer: Self-pay

## 2023-04-23 NOTE — Addendum Note (Signed)
 Encounter addended by: Hughie Closs S on: 04/23/2023 10:09 AM  Actions taken: Flowsheet accepted

## 2023-04-24 ENCOUNTER — Encounter (HOSPITAL_COMMUNITY): Payer: PPO

## 2023-04-24 ENCOUNTER — Other Ambulatory Visit (HOSPITAL_COMMUNITY): Payer: Self-pay

## 2023-04-24 DIAGNOSIS — Z79899 Other long term (current) drug therapy: Secondary | ICD-10-CM | POA: Diagnosis not present

## 2023-04-24 DIAGNOSIS — M545 Low back pain, unspecified: Secondary | ICD-10-CM | POA: Diagnosis not present

## 2023-04-24 DIAGNOSIS — I10 Essential (primary) hypertension: Secondary | ICD-10-CM | POA: Diagnosis not present

## 2023-04-24 DIAGNOSIS — M2578 Osteophyte, vertebrae: Secondary | ICD-10-CM | POA: Diagnosis not present

## 2023-04-24 DIAGNOSIS — G8929 Other chronic pain: Secondary | ICD-10-CM | POA: Diagnosis not present

## 2023-04-24 DIAGNOSIS — M7072 Other bursitis of hip, left hip: Secondary | ICD-10-CM | POA: Diagnosis not present

## 2023-04-24 DIAGNOSIS — M5030 Other cervical disc degeneration, unspecified cervical region: Secondary | ICD-10-CM | POA: Diagnosis not present

## 2023-04-24 DIAGNOSIS — M7071 Other bursitis of hip, right hip: Secondary | ICD-10-CM | POA: Diagnosis not present

## 2023-04-24 MED ORDER — OXYCODONE-ACETAMINOPHEN 5-325 MG PO TABS
1.0000 | ORAL_TABLET | Freq: Three times a day (TID) | ORAL | 0 refills | Status: DC | PRN
  Filled 2023-04-24: qty 90, 30d supply, fill #0

## 2023-04-27 ENCOUNTER — Encounter (HOSPITAL_COMMUNITY): Payer: PPO

## 2023-04-28 ENCOUNTER — Other Ambulatory Visit: Payer: Self-pay

## 2023-04-28 ENCOUNTER — Other Ambulatory Visit (HOSPITAL_COMMUNITY): Payer: Self-pay | Admitting: Neurosurgery

## 2023-04-28 ENCOUNTER — Other Ambulatory Visit (HOSPITAL_COMMUNITY): Payer: Self-pay

## 2023-04-28 DIAGNOSIS — Z79899 Other long term (current) drug therapy: Secondary | ICD-10-CM | POA: Diagnosis not present

## 2023-04-28 DIAGNOSIS — M4316 Spondylolisthesis, lumbar region: Secondary | ICD-10-CM | POA: Diagnosis not present

## 2023-04-28 DIAGNOSIS — M542 Cervicalgia: Secondary | ICD-10-CM

## 2023-04-29 ENCOUNTER — Encounter (HOSPITAL_COMMUNITY): Payer: PPO

## 2023-04-30 DIAGNOSIS — M542 Cervicalgia: Secondary | ICD-10-CM | POA: Diagnosis not present

## 2023-04-30 DIAGNOSIS — M25559 Pain in unspecified hip: Secondary | ICD-10-CM | POA: Diagnosis not present

## 2023-05-01 ENCOUNTER — Encounter (HOSPITAL_COMMUNITY): Payer: PPO

## 2023-05-04 ENCOUNTER — Encounter (HOSPITAL_COMMUNITY): Payer: PPO

## 2023-05-04 DIAGNOSIS — F4323 Adjustment disorder with mixed anxiety and depressed mood: Secondary | ICD-10-CM | POA: Diagnosis not present

## 2023-05-05 DIAGNOSIS — D225 Melanocytic nevi of trunk: Secondary | ICD-10-CM | POA: Diagnosis not present

## 2023-05-05 DIAGNOSIS — M25559 Pain in unspecified hip: Secondary | ICD-10-CM | POA: Diagnosis not present

## 2023-05-05 DIAGNOSIS — L578 Other skin changes due to chronic exposure to nonionizing radiation: Secondary | ICD-10-CM | POA: Diagnosis not present

## 2023-05-05 DIAGNOSIS — L821 Other seborrheic keratosis: Secondary | ICD-10-CM | POA: Diagnosis not present

## 2023-05-05 DIAGNOSIS — M542 Cervicalgia: Secondary | ICD-10-CM | POA: Diagnosis not present

## 2023-05-05 DIAGNOSIS — Z85828 Personal history of other malignant neoplasm of skin: Secondary | ICD-10-CM | POA: Diagnosis not present

## 2023-05-05 DIAGNOSIS — L814 Other melanin hyperpigmentation: Secondary | ICD-10-CM | POA: Diagnosis not present

## 2023-05-06 ENCOUNTER — Encounter (HOSPITAL_COMMUNITY): Payer: PPO

## 2023-05-08 ENCOUNTER — Encounter (HOSPITAL_COMMUNITY): Payer: PPO

## 2023-05-08 ENCOUNTER — Other Ambulatory Visit (HOSPITAL_COMMUNITY): Payer: Self-pay

## 2023-05-11 ENCOUNTER — Encounter (HOSPITAL_COMMUNITY): Payer: PPO

## 2023-05-11 ENCOUNTER — Encounter (HOSPITAL_COMMUNITY): Payer: Self-pay

## 2023-05-11 ENCOUNTER — Ambulatory Visit (HOSPITAL_COMMUNITY)
Admission: RE | Admit: 2023-05-11 | Discharge: 2023-05-11 | Disposition: A | Discharge status: Home / Self Care | Source: Ambulatory Visit | Attending: Neurosurgery | Admitting: Neurosurgery

## 2023-05-11 DIAGNOSIS — M542 Cervicalgia: Secondary | ICD-10-CM

## 2023-05-12 ENCOUNTER — Other Ambulatory Visit: Payer: Self-pay | Admitting: Internal Medicine

## 2023-05-12 ENCOUNTER — Other Ambulatory Visit: Payer: Self-pay

## 2023-05-12 ENCOUNTER — Other Ambulatory Visit (HOSPITAL_COMMUNITY): Payer: Self-pay

## 2023-05-12 MED ORDER — LEVOTHYROXINE SODIUM 100 MCG PO TABS
100.0000 ug | ORAL_TABLET | Freq: Every morning | ORAL | 0 refills | Status: DC
  Filled 2023-05-20: qty 90, 90d supply, fill #0

## 2023-05-13 ENCOUNTER — Encounter (HOSPITAL_COMMUNITY): Payer: PPO

## 2023-05-13 ENCOUNTER — Other Ambulatory Visit (HOSPITAL_COMMUNITY): Payer: Self-pay

## 2023-05-13 MED ORDER — PANTOPRAZOLE SODIUM 40 MG PO TBEC
40.0000 mg | DELAYED_RELEASE_TABLET | Freq: Every day | ORAL | 3 refills | Status: DC
  Filled 2023-05-13 – 2023-05-14 (×2): qty 30, 30d supply, fill #0
  Filled 2023-05-18: qty 90, 90d supply, fill #0

## 2023-05-14 ENCOUNTER — Other Ambulatory Visit (HOSPITAL_COMMUNITY): Payer: Self-pay

## 2023-05-14 ENCOUNTER — Other Ambulatory Visit: Payer: Self-pay

## 2023-05-15 ENCOUNTER — Ambulatory Visit: Payer: PPO | Attending: Internal Medicine | Admitting: Cardiology

## 2023-05-15 ENCOUNTER — Encounter (HOSPITAL_COMMUNITY): Payer: PPO

## 2023-05-15 ENCOUNTER — Encounter: Payer: Self-pay | Admitting: Cardiology

## 2023-05-15 ENCOUNTER — Other Ambulatory Visit (HOSPITAL_COMMUNITY): Payer: Self-pay

## 2023-05-15 VITALS — BP 120/68 | HR 60 | Resp 16 | Ht 64.0 in | Wt 129.4 lb

## 2023-05-15 DIAGNOSIS — Z87891 Personal history of nicotine dependence: Secondary | ICD-10-CM

## 2023-05-15 DIAGNOSIS — I10 Essential (primary) hypertension: Secondary | ICD-10-CM

## 2023-05-15 DIAGNOSIS — I25118 Atherosclerotic heart disease of native coronary artery with other forms of angina pectoris: Secondary | ICD-10-CM | POA: Diagnosis not present

## 2023-05-15 DIAGNOSIS — E782 Mixed hyperlipidemia: Secondary | ICD-10-CM

## 2023-05-15 NOTE — Progress Notes (Signed)
 Cardiology Office Note:  .   Date:  05/15/2023  ID:  Amanda Kim, DOB November 29, 1945, MRN [id number] PCP: Georgianne Fick, MD  [facility] Providers Cardiologist:  Yates Decamp, MD   History of Present Illness: .   Amanda Kim is a 78 y.o. Caucasian female patient with hypothyroidism, esophageal dysmotility and spasms, tobacco use disorder quit on 04/25/23, chronic back pain SP spinal stimulator placement presented to the emergency department on 02/06/2023 with chest pain and found to have NSTEMI and underwent successful angioplasty and stenting to proximal LAD and mid circumflex.   38-month office visit, she had experienced dyspnea with Brilinta.  She is still taking Brilinta and has clear-cut correlation with the medication but is being transitioned to Effient soon after she finishes the medication she has at home.  Otherwise no specific complaints.  Discussed the use of AI scribe software for clinical note transcription with the patient, who gave verbal consent to proceed.  History of Present Illness The patient, with a history of coronary artery disease, hypertension, and hypercholesterolemia, presents with intermittent chest heaviness and dyspnea. The symptoms are not related to exertion but occur during periods of fatigue. She denies angina and has not used nitroglycerin for chest pain, but has used it for esophageal spasms. She has noticed a correlation between her dyspnea and Brilinta, and plans to switch to Effient after finishing her current supply of Brilinta.  The patient has also experienced neck pain, which was severe enough to warrant an ER visit. She has a history of neck issues and was scheduled for a shot in the neck prior to her cardiac event. The neck pain has flared up again and she is awaiting an MRI.  The patient has successfully quit smoking, which she attributes to the support of the [employer]. She has also been managing her blood  pressure and cholesterol with losartan, metoprolol, and atorvastatin. She reports some difficulty with cutting her pills.  Labs   Lab Results  Component Value Date   CHOL 125 02/07/2023   HDL 33 (L) 02/07/2023   LDLCALC 62 02/07/2023   TRIG 151 (H) 02/07/2023   CHOLHDL 3.8 02/07/2023   Lab Results  Component Value Date   NA 137 03/30/2023   K 4.1 03/30/2023   CO2 21 (L) 03/30/2023   GLUCOSE 128 (H) 03/30/2023   BUN 14 03/30/2023   CREATININE 0.70 03/30/2023   CALCIUM 9.4 03/30/2023   GFRNONAA >60 03/30/2023      Latest Ref Rng & Units 03/30/2023    7:58 PM 02/07/2023   10:36 AM 02/07/2023    2:20 AM  BMP  Glucose 70 - 99 mg/dL 409  811  914   BUN 8 - 23 mg/dL 14  8  9    Creatinine 0.44 - 1.00 mg/dL 7.82  9.56  2.13   Sodium 135 - 145 mmol/L 137  136  141   Potassium 3.5 - 5.1 mmol/L 4.1  3.4  3.0   Chloride 98 - 111 mmol/L 103  103  106   CO2 22 - 32 mmol/L 21  26  24    Calcium 8.9 - 10.3 mg/dL 9.4  9.3  8.9       Latest Ref Rng & Units 03/30/2023    7:58 PM 02/07/2023    2:20 AM 02/06/2023    4:46 AM  CBC  WBC 4.0 - 10.5 K/uL 13.9  11.9  14.1   Hemoglobin 12.0 - 15.0 g/dL 08.6  13.7  14.2   Hematocrit 36.0 - 46.0 % 38.0  40.8  42.4   Platelets 150 - 400 K/uL 292  257  324    Lab Results  Component Value Date   HGBA1C 5.9 09/11/2011    Lab Results  Component Value Date   TSH 5.324 (H) 12/14/2012    Review of Systems  Cardiovascular:  Positive for dyspnea on exertion (with brilinta). Negative for chest pain and leg swelling.   Physical Exam:   VS:  BP 120/68 (BP Location: Left Arm, Patient Position: Sitting, Cuff Size: Normal)   Pulse 60   Resp 16   Ht 5\' 4"  (1.626 m)   Wt 129 lb 6.4 oz (58.7 kg)   SpO2 96%   BMI 22.21 kg/m    Wt Readings from Last 3 Encounters:  05/15/23 129 lb 6.4 oz (58.7 kg)  03/30/23 127 lb (57.6 kg)  02/25/23 128 lb 8.5 oz (58.3 kg)    Physical Exam Neck:     Vascular: No carotid bruit or JVD.  Cardiovascular:     Rate  and Rhythm: Normal rate and regular rhythm.     Pulses: Intact distal pulses.     Heart sounds: Normal heart sounds. No murmur heard.    No gallop.  Pulmonary:     Effort: Pulmonary effort is normal.     Breath sounds: Normal breath sounds.  Abdominal:     General: Bowel sounds are normal.     Palpations: Abdomen is soft.  Musculoskeletal:     Right lower leg: No edema.     Left lower leg: No edema.    Studies Reviewed: Marland Kitchen    Left Heart Catheterization 02/06/23 Proximal LAD stenosis 3.0 x 32 mm Synergy DES Mid circumflex/first OM 3.0 x 16 mm Synergy DES     DAPT with aspirin and ticagrelor x 12 months.  Aggressive risk reduction, tobacco cessation.  Okay for discharge tomorrow as long as no complications arise.   2D echo 02/07/23 1. Left ventricular ejection fraction, by estimation, is 55 to 60%. The left ventricle has normal function. The left ventricle has no regional wall motion abnormalities. Left ventricular diastolic parameters are indeterminate.  EKG:         EKG 02/07/2023: Normal sinus rhythm at a rate of 60 bpm, inferior infarct old.  Poor R progression, cannot exclude anteroseptal infarct old probably normal variant.   Medications and allergies    Allergies  Allergen Reactions   Sulfisoxazole Itching   Bee Venom Itching, Swelling and Rash   Penicillins Itching and Swelling     Current Outpatient Medications:    amLODipine (NORVASC) 10 MG tablet, Take 1 tablet (10 mg total) by mouth daily., Disp: 90 tablet, Rfl: 3   Ascorbic Acid (VITAMIN C) 1000 MG tablet, Take 1,000 mg by mouth daily., Disp: , Rfl:    aspirin EC 81 MG tablet, Take 1 tablet (81 mg total) by mouth daily. Swallow whole., Disp: 30 tablet, Rfl: 11   atorvastatin (LIPITOR) 40 MG tablet, Take 1 tablet (40 mg total) by mouth daily., Disp: 30 tablet, Rfl: 5   buPROPion (WELLBUTRIN SR) 150 MG 12 hr tablet, Take 1 tablet (150 mg total) by mouth 2 (two) times daily., Disp: 180 tablet, Rfl: 1   Calcium  Citrate-Vitamin D (CALCIUM CITRATE+D3 PO), Take 1 tablet by mouth daily., Disp: , Rfl:    CALCIUM PO, Take 1 tablet by mouth daily., Disp: , Rfl:    COLLAGEN PO, Take 1 Dose by mouth  daily., Disp: , Rfl:    COLOSTRUM PO, Take 1 Dose by mouth 4 (four) times a week., Disp: , Rfl:    DULoxetine (CYMBALTA) 60 MG capsule, Take 1 capsule (60 mg total) by mouth daily., Disp: 90 capsule, Rfl: 1   Estradiol (VAGIFEM) 10 MCG TABS vaginal tablet, Place 10 mcg vaginally 2 (two) times a week., Disp: , Rfl:    gabapentin (NEURONTIN) 100 MG capsule, Take 200 mg by mouth 2 (two) times daily., Disp: , Rfl:    hydrOXYzine (ATARAX) 25 MG tablet, Take 25 mg by mouth 3 (three) times daily as needed for anxiety (or sleep)., Disp: , Rfl:    levothyroxine (SYNTHROID) 100 MCG tablet, Take 1 tablet (100 mcg total) by mouth in the morning on an empty stomach., Disp: 90 tablet, Rfl: 0   losartan (COZAAR) 100 MG tablet, Take 1 tablet (100 mg total) by mouth every evening., Disp: 90 tablet, Rfl: 1   metoprolol succinate (TOPROL XL) 25 MG 24 hr tablet, Take 1 tablet (25 mg total) by mouth daily., Disp: 90 tablet, Rfl: 2   naloxone (NARCAN) nasal spray 4 mg/0.1 mL, Place 1 spray into the nose as needed for accidental overdose., Disp: 2 each, Rfl: 0   nitroGLYCERIN (NITROSTAT) 0.4 MG SL tablet, Place 1 tablet (0.4 mg total) under the tongue every 5 (five) minutes as needed for chest pain., Disp: 25 tablet, Rfl: 3   OVER THE COUNTER MEDICATION, Take 750 mg by mouth daily. Green Lipped Mussels, Disp: , Rfl:    OVER THE COUNTER MEDICATION, Take 1 Dose by mouth at bedtime. Sound Asleep Gummy, Disp: , Rfl:    oxyCODONE-acetaminophen (PERCOCET/ROXICET) 5-325 MG tablet, Take 1 tablet by mouth every 4 (four) hours as needed for severe pain (pain score 7-10)., Disp: , Rfl:    pantoprazole (PROTONIX) 40 MG tablet, Take 1 tablet (40 mg total) by mouth daily., Disp: 30 tablet, Rfl: 3   prasugrel (EFFIENT) 10 MG TABS tablet, Take 1 tablet (10  mg total) by mouth daily., Disp: 90 tablet, Rfl: 3   Sodium Hyaluronate, oral, (HYALURONIC ACID PO), Take 1 Dose by mouth daily., Disp: , Rfl:    valACYclovir (VALTREX) 1000 MG tablet, Take 1,000 mg by mouth 2 (two) times daily as needed ([place])., Disp: , Rfl: 2   ASSESSMENT AND PLAN: .      ICD-10-CM   1. Coronary artery disease of native artery of native heart with stable angina pectoris (HCC)  I25.118     2. Primary hypertension  I10     3. Mixed hyperlipidemia  E78.2     4. Quit smoking within past year 04/25/23  Z87.891       No orders of the defined types were placed in this encounter.   Medications Discontinued During This Encounter  Medication Reason   amLODipine (NORVASC) 10 MG tablet Duplicate   COLOSTRUM PO Dose change   DULoxetine (CYMBALTA) 20 MG capsule Dose change   BLACK CURRANT SEED OIL PO Patient Preference   gabapentin (NEURONTIN) 100 MG capsule Dose change   hydrOXYzine (ATARAX) 25 MG tablet Duplicate   losartan (COZAAR) 100 MG tablet Duplicate   oxyCODONE-acetaminophen (PERCOCET/ROXICET) 5-325 MG tablet Completed Course   oxyCODONE-acetaminophen (PERCOCET/ROXICET) 5-325 MG tablet Completed Course   traMADol (ULTRAM) 50 MG tablet Completed Course   levothyroxine (SYNTHROID, LEVOTHROID) 100 MCG tablet Duplicate   meloxicam (MOBIC) 15 MG tablet Completed Course   oxyCODONE (OXY IR/ROXICODONE) 5 MG immediate release tablet Dose change   potassium  chloride (MICRO-K) 10 MEQ CR capsule No longer needed (for PRN medications)   predniSONE (DELTASONE) 20 MG tablet Completed Course    1. Coronary artery disease of native artery of native heart with stable angina pectoris (HCC) CAD is well-managed with no recent angina episodes.  Does occasionally use nitroglycerin  for esophageal spasms, not chest pain that is similar to angina and she has continued to remain very active and exercising regularly.  She also quit smoking.. Transition from Brilinta to Effient is necessary  due to Brilinta-induced dyspnea.  2. Primary hypertension Hypertension is well-controlled with a stable blood pressure of 121/68 mmHg. Continue the current antihypertensive regimen including losartan, metoprolol, and amlodipine.  3. Mixed hyperlipidemia Cholesterol levels are well-controlled with atorvastatin. LDL is 62 mg/dL, below the target of less than 70 mg/dL. Continue atorvastatin 40 mg once daily.  4. Tobacco use She quit smoking last month in March 2025, I have congratulated her and encouraged her to remain abstinent.  Will see her back in 9 months and will consider discontinuing DAPT.  Chronic Neck Pain   Chronic neck pain has flared up, leading to an ER visit. Prednisone course is completed. An MRI is pending to assess the need for further intervention. Await MRI results to determine further management of neck pain.  Signed,  Yates Decamp, MD, [facility] 05/15/2023, 10:39 AM [facility] [address] [place], [place] [address] Phone: [phone]. Fax:  [phone]

## 2023-05-15 NOTE — Patient Instructions (Signed)
 Medication Instructions:  Stop potassium *If you need a refill on your cardiac medications before your next appointment, please call your pharmacy*  Lab Work: none If you have labs (blood work) drawn today and your tests are completely normal, you will receive your results only by: MyChart Message (if you have MyChart) OR A paper copy in the mail If you have any lab test that is abnormal or we need to change your treatment, we will call you to review the results.  Testing/Procedures: none  Follow-Up: At [facility], you and your health needs are our priority.  As part of our continuing mission to provide you with exceptional heart care, our providers are all part of one team.  This team includes your primary Cardiologist (physician) and Advanced Practice Providers or APPs (Physician Assistants and Nurse Practitioners) who all work together to provide you with the care you need, when you need it.  Your next appointment:   9 month(s)  Provider:   Yates Decamp, MD     We recommend signing up for the patient portal called "MyChart".  Sign up information is provided on this After Visit Summary.  MyChart is used to connect with patients for Virtual Visits (Telemedicine).  Patients are able to view lab/test results, encounter notes, upcoming appointments, etc.  Non-urgent messages can be sent to your provider as well.   To learn more about what you can do with MyChart, go to [email or web address].   Other Instructions       1st Floor: - Lobby - Registration  - Pharmacy  - Lab - Cafe  2nd Floor: - PV Lab - Diagnostic Testing (echo, CT, nuclear med)  3rd Floor: - Vacant  4th Floor: - TCTS (cardiothoracic surgery) - AFib Clinic - Structural Heart Clinic - Vascular Surgery  - Vascular Ultrasound  5th Floor: - HeartCare Cardiology (general and EP) - Clinical Pharmacy for coumadin, hypertension, lipid, weight-loss medications, and med management  appointments    Valet parking services will be available as well.

## 2023-05-18 ENCOUNTER — Encounter (HOSPITAL_COMMUNITY): Payer: PPO

## 2023-05-18 ENCOUNTER — Other Ambulatory Visit (HOSPITAL_COMMUNITY): Payer: Self-pay

## 2023-05-18 DIAGNOSIS — F4323 Adjustment disorder with mixed anxiety and depressed mood: Secondary | ICD-10-CM | POA: Diagnosis not present

## 2023-05-19 DIAGNOSIS — M542 Cervicalgia: Secondary | ICD-10-CM | POA: Diagnosis not present

## 2023-05-19 DIAGNOSIS — M25559 Pain in unspecified hip: Secondary | ICD-10-CM | POA: Diagnosis not present

## 2023-05-20 ENCOUNTER — Other Ambulatory Visit (HOSPITAL_COMMUNITY): Payer: Self-pay

## 2023-05-20 ENCOUNTER — Encounter (HOSPITAL_COMMUNITY): Payer: PPO

## 2023-05-22 ENCOUNTER — Other Ambulatory Visit (HOSPITAL_COMMUNITY): Payer: Self-pay

## 2023-05-25 ENCOUNTER — Other Ambulatory Visit (HOSPITAL_COMMUNITY): Payer: Self-pay

## 2023-05-25 DIAGNOSIS — M2578 Osteophyte, vertebrae: Secondary | ICD-10-CM | POA: Diagnosis not present

## 2023-05-25 DIAGNOSIS — R29818 Other symptoms and signs involving the nervous system: Secondary | ICD-10-CM | POA: Diagnosis not present

## 2023-05-25 DIAGNOSIS — Z79899 Other long term (current) drug therapy: Secondary | ICD-10-CM | POA: Diagnosis not present

## 2023-05-25 DIAGNOSIS — M7071 Other bursitis of hip, right hip: Secondary | ICD-10-CM | POA: Diagnosis not present

## 2023-05-25 DIAGNOSIS — M7072 Other bursitis of hip, left hip: Secondary | ICD-10-CM | POA: Diagnosis not present

## 2023-05-25 DIAGNOSIS — M5030 Other cervical disc degeneration, unspecified cervical region: Secondary | ICD-10-CM | POA: Diagnosis not present

## 2023-05-25 DIAGNOSIS — M549 Dorsalgia, unspecified: Secondary | ICD-10-CM | POA: Diagnosis not present

## 2023-05-25 MED ORDER — OXYCODONE-ACETAMINOPHEN 5-325 MG PO TABS
1.0000 | ORAL_TABLET | Freq: Three times a day (TID) | ORAL | 0 refills | Status: DC | PRN
Start: 2023-05-25 — End: 2023-06-23
  Filled 2023-05-25 – 2023-06-05 (×2): qty 90, 30d supply, fill #0

## 2023-05-27 DIAGNOSIS — Z79899 Other long term (current) drug therapy: Secondary | ICD-10-CM | POA: Diagnosis not present

## 2023-06-01 DIAGNOSIS — F4323 Adjustment disorder with mixed anxiety and depressed mood: Secondary | ICD-10-CM | POA: Diagnosis not present

## 2023-06-03 DIAGNOSIS — M25559 Pain in unspecified hip: Secondary | ICD-10-CM | POA: Diagnosis not present

## 2023-06-03 DIAGNOSIS — M542 Cervicalgia: Secondary | ICD-10-CM | POA: Diagnosis not present

## 2023-06-04 ENCOUNTER — Other Ambulatory Visit (HOSPITAL_COMMUNITY): Payer: Self-pay

## 2023-06-05 ENCOUNTER — Other Ambulatory Visit (HOSPITAL_COMMUNITY): Payer: Self-pay

## 2023-06-06 DIAGNOSIS — M25559 Pain in unspecified hip: Secondary | ICD-10-CM | POA: Diagnosis not present

## 2023-06-06 DIAGNOSIS — M542 Cervicalgia: Secondary | ICD-10-CM | POA: Diagnosis not present

## 2023-06-06 DIAGNOSIS — M25519 Pain in unspecified shoulder: Secondary | ICD-10-CM | POA: Diagnosis not present

## 2023-06-09 ENCOUNTER — Other Ambulatory Visit (HOSPITAL_COMMUNITY): Payer: Self-pay

## 2023-06-09 MED ORDER — GABAPENTIN 100 MG PO CAPS
ORAL_CAPSULE | ORAL | 1 refills | Status: DC
  Filled 2023-06-09: qty 630, 90d supply, fill #0

## 2023-06-11 DIAGNOSIS — M25559 Pain in unspecified hip: Secondary | ICD-10-CM | POA: Diagnosis not present

## 2023-06-11 DIAGNOSIS — M542 Cervicalgia: Secondary | ICD-10-CM | POA: Diagnosis not present

## 2023-06-15 ENCOUNTER — Other Ambulatory Visit (HOSPITAL_COMMUNITY): Payer: Self-pay

## 2023-06-16 DIAGNOSIS — M25519 Pain in unspecified shoulder: Secondary | ICD-10-CM | POA: Diagnosis not present

## 2023-06-16 DIAGNOSIS — M542 Cervicalgia: Secondary | ICD-10-CM | POA: Diagnosis not present

## 2023-06-16 DIAGNOSIS — M25559 Pain in unspecified hip: Secondary | ICD-10-CM | POA: Diagnosis not present

## 2023-06-23 ENCOUNTER — Other Ambulatory Visit (HOSPITAL_COMMUNITY): Payer: Self-pay

## 2023-06-23 DIAGNOSIS — M2578 Osteophyte, vertebrae: Secondary | ICD-10-CM | POA: Diagnosis not present

## 2023-06-23 DIAGNOSIS — M5030 Other cervical disc degeneration, unspecified cervical region: Secondary | ICD-10-CM | POA: Diagnosis not present

## 2023-06-23 DIAGNOSIS — R030 Elevated blood-pressure reading, without diagnosis of hypertension: Secondary | ICD-10-CM | POA: Diagnosis not present

## 2023-06-23 DIAGNOSIS — M7072 Other bursitis of hip, left hip: Secondary | ICD-10-CM | POA: Diagnosis not present

## 2023-06-23 DIAGNOSIS — M549 Dorsalgia, unspecified: Secondary | ICD-10-CM | POA: Diagnosis not present

## 2023-06-23 DIAGNOSIS — Z79899 Other long term (current) drug therapy: Secondary | ICD-10-CM | POA: Diagnosis not present

## 2023-06-23 DIAGNOSIS — M7071 Other bursitis of hip, right hip: Secondary | ICD-10-CM | POA: Diagnosis not present

## 2023-06-23 DIAGNOSIS — G8929 Other chronic pain: Secondary | ICD-10-CM | POA: Diagnosis not present

## 2023-06-23 DIAGNOSIS — M545 Low back pain, unspecified: Secondary | ICD-10-CM | POA: Diagnosis not present

## 2023-06-23 MED ORDER — OXYCODONE-ACETAMINOPHEN 5-325 MG PO TABS
1.0000 | ORAL_TABLET | Freq: Three times a day (TID) | ORAL | 0 refills | Status: DC | PRN
  Filled 2023-07-09: qty 90, 30d supply, fill #0

## 2023-06-24 ENCOUNTER — Other Ambulatory Visit (HOSPITAL_COMMUNITY): Payer: Self-pay

## 2023-06-24 ENCOUNTER — Other Ambulatory Visit: Payer: Self-pay

## 2023-06-25 ENCOUNTER — Other Ambulatory Visit (HOSPITAL_COMMUNITY): Payer: Self-pay

## 2023-06-25 DIAGNOSIS — M25519 Pain in unspecified shoulder: Secondary | ICD-10-CM | POA: Diagnosis not present

## 2023-06-25 DIAGNOSIS — M25559 Pain in unspecified hip: Secondary | ICD-10-CM | POA: Diagnosis not present

## 2023-06-25 DIAGNOSIS — M542 Cervicalgia: Secondary | ICD-10-CM | POA: Diagnosis not present

## 2023-06-25 DIAGNOSIS — Z79899 Other long term (current) drug therapy: Secondary | ICD-10-CM | POA: Diagnosis not present

## 2023-07-01 ENCOUNTER — Other Ambulatory Visit (HOSPITAL_COMMUNITY): Payer: Self-pay

## 2023-07-01 MED ORDER — HYDROXYZINE HCL 25 MG PO TABS
25.0000 mg | ORAL_TABLET | Freq: Three times a day (TID) | ORAL | 1 refills | Status: DC
  Filled 2023-07-01: qty 90, 30d supply, fill #0
  Filled 2023-07-13: qty 90, 30d supply, fill #1

## 2023-07-01 MED ORDER — ONDANSETRON HCL 4 MG PO TABS
4.0000 mg | ORAL_TABLET | Freq: Three times a day (TID) | ORAL | 0 refills | Status: DC
  Filled 2023-07-01: qty 30, 10d supply, fill #0

## 2023-07-01 MED ORDER — GABAPENTIN 300 MG PO CAPS
300.0000 mg | ORAL_CAPSULE | Freq: Three times a day (TID) | ORAL | 3 refills | Status: DC
  Filled 2023-07-01: qty 90, 30d supply, fill #0
  Filled 2023-07-13 – 2023-07-30 (×2): qty 90, 30d supply, fill #1
  Filled 2023-10-26: qty 90, 30d supply, fill #2
  Filled 2023-11-19: qty 90, 30d supply, fill #3

## 2023-07-06 DIAGNOSIS — M542 Cervicalgia: Secondary | ICD-10-CM | POA: Diagnosis not present

## 2023-07-06 DIAGNOSIS — M25519 Pain in unspecified shoulder: Secondary | ICD-10-CM | POA: Diagnosis not present

## 2023-07-06 DIAGNOSIS — M25559 Pain in unspecified hip: Secondary | ICD-10-CM | POA: Diagnosis not present

## 2023-07-08 ENCOUNTER — Other Ambulatory Visit (HOSPITAL_COMMUNITY): Payer: Self-pay

## 2023-07-08 DIAGNOSIS — I10 Essential (primary) hypertension: Secondary | ICD-10-CM | POA: Diagnosis not present

## 2023-07-08 DIAGNOSIS — J432 Centrilobular emphysema: Secondary | ICD-10-CM | POA: Diagnosis not present

## 2023-07-08 DIAGNOSIS — I700 Atherosclerosis of aorta: Secondary | ICD-10-CM | POA: Diagnosis not present

## 2023-07-08 DIAGNOSIS — I2510 Atherosclerotic heart disease of native coronary artery without angina pectoris: Secondary | ICD-10-CM | POA: Diagnosis not present

## 2023-07-08 DIAGNOSIS — E782 Mixed hyperlipidemia: Secondary | ICD-10-CM | POA: Diagnosis not present

## 2023-07-08 DIAGNOSIS — G4762 Sleep related leg cramps: Secondary | ICD-10-CM | POA: Diagnosis not present

## 2023-07-08 DIAGNOSIS — D692 Other nonthrombocytopenic purpura: Secondary | ICD-10-CM | POA: Diagnosis not present

## 2023-07-08 DIAGNOSIS — F411 Generalized anxiety disorder: Secondary | ICD-10-CM | POA: Diagnosis not present

## 2023-07-08 DIAGNOSIS — E1169 Type 2 diabetes mellitus with other specified complication: Secondary | ICD-10-CM | POA: Diagnosis not present

## 2023-07-08 DIAGNOSIS — E039 Hypothyroidism, unspecified: Secondary | ICD-10-CM | POA: Diagnosis not present

## 2023-07-09 ENCOUNTER — Other Ambulatory Visit: Payer: Self-pay

## 2023-07-09 ENCOUNTER — Other Ambulatory Visit (HOSPITAL_COMMUNITY): Payer: Self-pay

## 2023-07-10 DIAGNOSIS — M25519 Pain in unspecified shoulder: Secondary | ICD-10-CM | POA: Diagnosis not present

## 2023-07-10 DIAGNOSIS — M542 Cervicalgia: Secondary | ICD-10-CM | POA: Diagnosis not present

## 2023-07-10 DIAGNOSIS — M25559 Pain in unspecified hip: Secondary | ICD-10-CM | POA: Diagnosis not present

## 2023-07-13 ENCOUNTER — Other Ambulatory Visit (HOSPITAL_COMMUNITY): Payer: Self-pay

## 2023-07-13 ENCOUNTER — Encounter: Payer: Self-pay | Admitting: Cardiology

## 2023-07-14 ENCOUNTER — Other Ambulatory Visit (HOSPITAL_COMMUNITY): Payer: Self-pay

## 2023-07-14 ENCOUNTER — Other Ambulatory Visit: Payer: Self-pay

## 2023-07-15 ENCOUNTER — Other Ambulatory Visit (HOSPITAL_COMMUNITY): Payer: Self-pay

## 2023-07-15 DIAGNOSIS — E782 Mixed hyperlipidemia: Secondary | ICD-10-CM | POA: Diagnosis not present

## 2023-07-15 DIAGNOSIS — D692 Other nonthrombocytopenic purpura: Secondary | ICD-10-CM | POA: Diagnosis not present

## 2023-07-15 DIAGNOSIS — G4762 Sleep related leg cramps: Secondary | ICD-10-CM | POA: Diagnosis not present

## 2023-07-15 DIAGNOSIS — F411 Generalized anxiety disorder: Secondary | ICD-10-CM | POA: Diagnosis not present

## 2023-07-15 DIAGNOSIS — I10 Essential (primary) hypertension: Secondary | ICD-10-CM | POA: Diagnosis not present

## 2023-07-15 DIAGNOSIS — E039 Hypothyroidism, unspecified: Secondary | ICD-10-CM | POA: Diagnosis not present

## 2023-07-15 DIAGNOSIS — I2510 Atherosclerotic heart disease of native coronary artery without angina pectoris: Secondary | ICD-10-CM | POA: Diagnosis not present

## 2023-07-15 DIAGNOSIS — I700 Atherosclerosis of aorta: Secondary | ICD-10-CM | POA: Diagnosis not present

## 2023-07-15 DIAGNOSIS — E1169 Type 2 diabetes mellitus with other specified complication: Secondary | ICD-10-CM | POA: Diagnosis not present

## 2023-07-15 DIAGNOSIS — J432 Centrilobular emphysema: Secondary | ICD-10-CM | POA: Diagnosis not present

## 2023-07-15 MED ORDER — ROSUVASTATIN CALCIUM 5 MG PO TABS
5.0000 mg | ORAL_TABLET | Freq: Every day | ORAL | 1 refills | Status: DC
  Filled 2023-07-15: qty 90, 90d supply, fill #0

## 2023-07-20 DIAGNOSIS — M25559 Pain in unspecified hip: Secondary | ICD-10-CM | POA: Diagnosis not present

## 2023-07-20 DIAGNOSIS — M25519 Pain in unspecified shoulder: Secondary | ICD-10-CM | POA: Diagnosis not present

## 2023-07-20 DIAGNOSIS — M542 Cervicalgia: Secondary | ICD-10-CM | POA: Diagnosis not present

## 2023-07-22 ENCOUNTER — Other Ambulatory Visit (HOSPITAL_COMMUNITY): Payer: Self-pay

## 2023-07-22 DIAGNOSIS — M7062 Trochanteric bursitis, left hip: Secondary | ICD-10-CM | POA: Diagnosis not present

## 2023-07-22 DIAGNOSIS — M5030 Other cervical disc degeneration, unspecified cervical region: Secondary | ICD-10-CM | POA: Diagnosis not present

## 2023-07-22 DIAGNOSIS — J439 Emphysema, unspecified: Secondary | ICD-10-CM | POA: Diagnosis not present

## 2023-07-22 DIAGNOSIS — M7071 Other bursitis of hip, right hip: Secondary | ICD-10-CM | POA: Diagnosis not present

## 2023-07-22 DIAGNOSIS — M549 Dorsalgia, unspecified: Secondary | ICD-10-CM | POA: Diagnosis not present

## 2023-07-22 DIAGNOSIS — Z0000 Encounter for general adult medical examination without abnormal findings: Secondary | ICD-10-CM | POA: Diagnosis not present

## 2023-07-22 DIAGNOSIS — M545 Low back pain, unspecified: Secondary | ICD-10-CM | POA: Diagnosis not present

## 2023-07-22 DIAGNOSIS — G8929 Other chronic pain: Secondary | ICD-10-CM | POA: Diagnosis not present

## 2023-07-22 DIAGNOSIS — I10 Essential (primary) hypertension: Secondary | ICD-10-CM | POA: Diagnosis not present

## 2023-07-22 DIAGNOSIS — M7072 Other bursitis of hip, left hip: Secondary | ICD-10-CM | POA: Diagnosis not present

## 2023-07-22 DIAGNOSIS — M2578 Osteophyte, vertebrae: Secondary | ICD-10-CM | POA: Diagnosis not present

## 2023-07-22 DIAGNOSIS — Z79899 Other long term (current) drug therapy: Secondary | ICD-10-CM | POA: Diagnosis not present

## 2023-07-22 MED ORDER — OXYCODONE-ACETAMINOPHEN 5-325 MG PO TABS
1.0000 | ORAL_TABLET | Freq: Three times a day (TID) | ORAL | 0 refills | Status: DC | PRN
  Filled 2023-08-11: qty 90, 30d supply, fill #0

## 2023-07-24 DIAGNOSIS — Z79899 Other long term (current) drug therapy: Secondary | ICD-10-CM | POA: Diagnosis not present

## 2023-07-28 ENCOUNTER — Other Ambulatory Visit (HOSPITAL_COMMUNITY): Payer: Self-pay

## 2023-07-28 ENCOUNTER — Other Ambulatory Visit: Payer: Self-pay

## 2023-07-28 MED ORDER — HYDROXYZINE HCL 25 MG PO TABS
25.0000 mg | ORAL_TABLET | Freq: Three times a day (TID) | ORAL | 1 refills | Status: DC | PRN
  Filled 2023-07-28: qty 270, 90d supply, fill #0

## 2023-07-28 MED ORDER — ROSUVASTATIN CALCIUM 5 MG PO TABS: 5.0000 mg | ORAL_TABLET | Freq: Every day | ORAL | 3 refills | Status: DC

## 2023-07-28 MED ORDER — LEVOTHYROXINE SODIUM 100 MCG PO TABS
100.0000 ug | ORAL_TABLET | Freq: Every morning | ORAL | 3 refills | Status: AC
  Filled 2023-07-28: qty 90, 90d supply, fill #0
  Filled 2023-09-15 – 2024-02-06 (×3): qty 90, 90d supply, fill #1

## 2023-07-30 DIAGNOSIS — M1811 Unilateral primary osteoarthritis of first carpometacarpal joint, right hand: Secondary | ICD-10-CM | POA: Diagnosis not present

## 2023-08-02 ENCOUNTER — Encounter (HOSPITAL_COMMUNITY): Payer: Self-pay

## 2023-08-03 ENCOUNTER — Encounter: Payer: Self-pay | Admitting: Physician Assistant

## 2023-08-03 ENCOUNTER — Other Ambulatory Visit (HOSPITAL_COMMUNITY): Payer: Self-pay

## 2023-08-03 ENCOUNTER — Ambulatory Visit: Attending: Physician Assistant | Admitting: Physician Assistant

## 2023-08-03 VITALS — BP 118/74 | HR 80 | Ht 63.0 in | Wt 130.2 lb

## 2023-08-03 DIAGNOSIS — Z87891 Personal history of nicotine dependence: Secondary | ICD-10-CM | POA: Diagnosis not present

## 2023-08-03 DIAGNOSIS — Z9861 Coronary angioplasty status: Secondary | ICD-10-CM

## 2023-08-03 DIAGNOSIS — R0602 Shortness of breath: Secondary | ICD-10-CM | POA: Diagnosis not present

## 2023-08-03 DIAGNOSIS — I10 Essential (primary) hypertension: Secondary | ICD-10-CM | POA: Diagnosis not present

## 2023-08-03 DIAGNOSIS — E782 Mixed hyperlipidemia: Secondary | ICD-10-CM

## 2023-08-03 DIAGNOSIS — Z720 Tobacco use: Secondary | ICD-10-CM

## 2023-08-03 DIAGNOSIS — I2510 Atherosclerotic heart disease of native coronary artery without angina pectoris: Secondary | ICD-10-CM | POA: Diagnosis not present

## 2023-08-03 DIAGNOSIS — I25118 Atherosclerotic heart disease of native coronary artery with other forms of angina pectoris: Secondary | ICD-10-CM | POA: Diagnosis not present

## 2023-08-03 MED ORDER — ROSUVASTATIN CALCIUM 20 MG PO TABS
20.0000 mg | ORAL_TABLET | Freq: Every day | ORAL | 3 refills | Status: AC
  Filled 2023-08-03: qty 90, 90d supply, fill #0
  Filled 2023-10-30: qty 90, 90d supply, fill #1
  Filled 2024-01-25: qty 90, 90d supply, fill #2

## 2023-08-03 NOTE — Progress Notes (Signed)
 Cardiology Office Note:    Date:  08/03/2023   ID:  Sheala, Dosh 1945/08/28, MRN [id number]  PCP:  Virgle Grime, MD   [facility] Providers Cardiologist:  Knox Perl, MD     Referring MD: Virgle Grime, MD   Chief Complaint  Patient presents with   Follow-up    SOB    History of Present Illness:    Amanda Kim is a 78 y.o. female with a hx of CAD, hyperlipidemia, hypothyroidism, esophageal dysmotility and spasms, tobacco use disorder quit 04/2023, chronic back pain with spinal stimulator in place, and recent NSTEMI 01/2023 resulting in stenting to proximal LAD and mid circumflex.  She was hospitalized 01/2023 with NSTEMI.  LHC showed severe two-vessel disease in the proximal LAD and circumflex.  She underwent successful DES-proximal LAD with 3.0 x 32 mm stent and DES-mid LCx/first OM with 3.0 x 16 mm stent.  She had dyspnea with Brilinta  and was transition to Effient .  Echocardiogram showed LVEF 55-60%, no RWMA, no significant valvular disease.  She called our office reporting shortness of breath and was added to my schedule today.  She walks 40 min per day without symptoms. She also does gentle yoga twice per week.  She states when she is sitting and then gets up and walks, she gets short of breath. She quit smoking 04/18/23 after smoking 50 years 1/2 ppd. Her boyfriend purchased OTC canned air/oxygen , she has been puffing on this but unsure it resolves her SOB. No orthopnea, no LE edema. She also describes near-syncope/feeling like she will pass out when she is SOB and that it starts in her legs. She has no LOC. This occurs 8 times per week.  She denies palpitations.   She had a CT chest for lung cancer screening 03/2023 that showed emphysema changes. She also reports night sweats x 2 months, but also in the setting of having a new partner in bed at night. She denies weight loss.   She has not taken BP meds yet including 50 mg losartan  and  25 mg toprol . BP this morning was 180, but 118 here.    Past Medical History:  Diagnosis Date   Back pain    d/t horse fell on pt in the 80's   Bone spur    in neck(since 1990) and 1st 3 fingers on right hand go numb   Chronic pain    d/t horse accident in the 80's   Headache(784.0)    tension   History of colon polyps    Hyperlipidemia    takes Fish Oil and CoQ10   Hypertension    takes Inderal  daily   Hypothyroid 05/07/2011   takes Synthroid  daily   Leg cramps    takes Potassium prn   Liver disease    nonalcholic fatty liver disease   Nausea & vomiting    phenergan  and zofran  prn   Spondylolisthesis    Spondylosis of thoracolumbar region without myelopathy or radiculopathy    spine   Urinary frequency     Past Surgical History:  Procedure Laterality Date   CESAREAN SECTION  1967, 1970   CHOLECYSTECTOMY N/A 05/20/2012   Procedure: LAPAROSCOPIC CHOLECYSTECTOMY WITH INTRAOPERATIVE CHOLANGIOGRAM;  Surgeon: Mayme Spearman, MD;  Location: [facility] OR;  Service: General;  Laterality: N/A;   COLONOSCOPY     CORONARY STENT INTERVENTION N/A 02/06/2023   Procedure: CORONARY STENT INTERVENTION;  Surgeon: Arnoldo Lapping, MD;  Location: [facility] INVASIVE CV LAB;  Service: Cardiovascular;  Laterality: N/A;   CORONARY ULTRASOUND/IVUS N/A 02/06/2023   Procedure: Coronary Ultrasound/IVUS;  Surgeon: Arnoldo Lapping, MD;  Location: [facility] INVASIVE CV LAB;  Service: Cardiovascular;  Laterality: N/A;   LEFT HEART CATH AND CORONARY ANGIOGRAPHY N/A 02/06/2023   Procedure: LEFT HEART CATH AND CORONARY ANGIOGRAPHY;  Surgeon: Arnoldo Lapping, MD;  Location: [facility] INVASIVE CV LAB;  Service: Cardiovascular;  Laterality: N/A;   TUBAL LIGATION  1981    Current Medications: Current Meds  Medication Sig   aspirin  EC 81 MG tablet Take 1 tablet (81 mg total) by mouth daily. Swallow whole.   buPROPion  (WELLBUTRIN  SR) 150 MG 12 hr tablet Take 1 tablet (150 mg total) by mouth 2 (two) times daily.   Calcium   Citrate-Vitamin D  (CALCIUM  CITRATE+D3 PO) Take 1 tablet by mouth daily.   DULoxetine  (CYMBALTA ) 60 MG capsule Take 1 capsule (60 mg total) by mouth daily.   Estradiol  (VAGIFEM ) 10 MCG TABS vaginal tablet Place 10 mcg vaginally 2 (two) times a week.   gabapentin  (NEURONTIN ) 300 MG capsule Take 1 capsule (300 mg total) by mouth 3 (three) times daily.   hydrOXYzine  (ATARAX ) 25 MG tablet Take 25 mg by mouth 3 (three) times daily as needed for anxiety (or sleep).   levothyroxine  (SYNTHROID ) 100 MCG tablet Take 1 tablet (100 mcg total) by mouth in the morning on an empty stomach.   losartan  (COZAAR ) 50 MG tablet Take 50 mg by mouth daily.   metoprolol  succinate (TOPROL  XL) 25 MG 24 hr tablet Take 1 tablet (25 mg total) by mouth daily.   naloxone  (NARCAN ) nasal spray 4 mg/0.1 mL Place 1 spray into the nose as needed for accidental overdose.   nitroGLYCERIN  (NITROSTAT ) 0.4 MG SL tablet Place 1 tablet (0.4 mg total) under the tongue every 5 (five) minutes as needed for chest pain.   ondansetron  (ZOFRAN ) 4 MG tablet Take 1 tablet (4 mg total) by mouth 3 (three) times daily as needed for nausea.   OVER THE COUNTER MEDICATION Take 750 mg by mouth daily. Green Lipped Mussels   oxyCODONE -acetaminophen  (PERCOCET/ROXICET) 5-325 MG tablet Take 1 tablet by mouth every 4 (four) hours as needed for severe pain (pain score 7-10).   prasugrel  (EFFIENT ) 10 MG TABS tablet Take 1 tablet (10 mg total) by mouth daily.   rosuvastatin  (CRESTOR ) 20 MG tablet Take 1 tablet (20 mg total) by mouth daily.   valACYclovir  (VALTREX ) 1000 MG tablet Take 1,000 mg by mouth 2 (two) times daily as needed ([place]).   [DISCONTINUED] atorvastatin  (LIPITOR) 40 MG tablet Take 1 tablet (40 mg total) by mouth daily.   [DISCONTINUED] losartan  (COZAAR ) 100 MG tablet Take 1 tablet (100 mg total) by mouth every evening. (Patient taking differently: Take 100 mg by mouth every evening. Per patient taking 1/2 tablet daily)   [DISCONTINUED] rosuvastatin   (CRESTOR ) 5 MG tablet Take 1 tablet (5 mg total) by mouth daily.     Allergies:   Sulfisoxazole, Bee venom, and Penicillins   Social History   Socioeconomic History   Marital status: Single    Spouse name: Not on file   Number of children: 2   Years of education: 1 yr colle   Highest education level: Not on file  Occupational History   Occupation: retired-HR   Occupation: small business owner-consulting  Tobacco Use   Smoking status: Every Day    Current packs/day: 0.80    Average packs/day: 0.8 packs/day for 45.0 years (36.0 ttl pk-yrs)    Types: Cigarettes   Smokeless  tobacco: Never   Tobacco comments:    Using quitline [facility] for cessation counseling, down to 6 cigarettes/day.   Substance and Sexual Activity   Alcohol use: Yes    Alcohol/week: 0.0 standard drinks of alcohol    Comment: red wine couple of times a week   Drug use: No   Sexual activity: Yes    Birth control/protection: Post-menopausal  Other Topics Concern   Not on file  Social History Narrative   09/07/12- Pt reports going to an internalist in [place] for chronic disease management.  Educated pt that family doctor and internalist are the same specialty and it would be better if only one doctor was managing her chronic disease.  Pt said she would like to stay with both and use us  for acute visits.          Health Care POA: Sister, Thomasena Fleming   Emergency Contact: Alane Allen, [phone]   End of Life Plan:    Who lives with you: self   Any pets: none   Diet: Pt has a varied diet of protein, starch and vegetables.  Pt is currently on lower carb diet for weight loss.   Exercise: Pt exercises with friend 5-6x a week.   Seatbelts: Pt reports wearing seatbelt when in vehicles.    Paulene Boron Exposure/Protection: Pt reports using sunscreen daily.   Hobbies: reading, gardening, decorating, art, walking, movies                     Social Drivers of Health   Financial Resource Strain: Not on file   Food Insecurity: No Food Insecurity (02/10/2023)   Hunger Vital Sign    Worried About Running Out of Food in the Last Year: Never true    Ran Out of Food in the Last Year: Never true  Transportation Needs: No Transportation Needs (02/10/2023)   PRAPARE - Administrator, Civil Service (Medical): No    Lack of Transportation (Non-Medical): No  Physical Activity: Not on file  Stress: Stress Concern Present (02/20/2023)   [employer] of Occupational Health - Occupational Stress Questionnaire    Feeling of Stress : Rather much  Social Connections: Unknown (07/01/2021)   Received from [facility]   Social Network    Social Network: Not on file     Family History: The patient's family history includes Alcohol abuse in her mother; Bipolar disorder in her father; Cancer in her paternal grandmother; Colon cancer in her paternal grandmother; Depression in her father; Heart attack in her paternal grandfather; Heart disease in her paternal uncle; Hyperlipidemia in her paternal uncle; Hypertension in her mother.  ROS:   Please see the history of present illness.     All other systems reviewed and are negative.  EKGs/Labs/Other Studies Reviewed:    The following studies were reviewed today:  EKG Interpretation Date/Time:  Monday August 03 2023 13:40:35 EDT Ventricular Rate:  80 PR Interval:  156 QRS Duration:  74 QT Interval:  364 QTC Calculation: 419 R Axis:   55  Text Interpretation: Normal sinus rhythm Anterior infarct (cited on or before 06-Feb-2023) When compared with ECG of 30-Mar-2023 19:53, Criteria for Inferior infarct are no longer Present ST elevation now present in Inferior leads Confirmed by Marcie Sever ([id number]) on 08/03/2023 1:42:38 PM    Recent Labs: 10/10/2022: ALT 26 02/07/2023: Magnesium 2.0 03/30/2023: B Natriuretic Peptide 136.8; BUN 14; Creatinine, Ser 0.70; Hemoglobin 12.9; Platelets 292; Potassium 4.1; Sodium 137  Recent Lipid  Panel    Component  Value Date/Time   CHOL 125 02/07/2023 0220   TRIG 151 (H) 02/07/2023 0220   HDL 33 (L) 02/07/2023 0220   CHOLHDL 3.8 02/07/2023 0220   VLDL 30 02/07/2023 0220   LDLCALC 62 02/07/2023 0220     Risk Assessment/Calculations:                Physical Exam:    VS:  BP 118/74   Pulse 80   Ht 5' 3 (1.6 m)   Wt 130 lb 3.2 oz (59.1 kg)   SpO2 95%   BMI 23.06 kg/m     Wt Readings from Last 3 Encounters:  08/03/23 130 lb 3.2 oz (59.1 kg)  05/15/23 129 lb 6.4 oz (58.7 kg)  03/30/23 127 lb (57.6 kg)     GEN:  Well nourished, well developed in no acute distress HEENT: Normal NECK: No JVD; No carotid bruits LYMPHATICS: No lymphadenopathy CARDIAC: RRR, no murmurs, rubs, gallops RESPIRATORY:  Clear to auscultation without rales, wheezing or rhonchi  ABDOMEN: Soft, non-tender, non-distended MUSCULOSKELETAL:  No edema; No deformity  SKIN: Warm and dry NEUROLOGIC:  Alert and oriented x 3 PSYCHIATRIC:  Normal affect   ASSESSMENT:    1. Shortness of breath   2. Coronary artery disease of native artery of native heart with stable angina pectoris (HCC)   3. CAD S/P percutaneous coronary angioplasty   4. Quit smoking within past year 04/25/23   5. Primary hypertension   6. Tobacco use   7. Moderate mixed hyperlipidemia not requiring statin therapy    PLAN:    In order of problems listed above:  Shortness of breath Presyncope  - low suspicion for underlying cardiac etiology - reassuring echo, EKG, and recent heart cath - also can walk 40 min without symptoms - suspect she may have underlying COPD, no wheezing one exam today - night sweats are concerning, will refer to pulmonology urgently for evaluation   Labile BP  - SBP was 180 this morning and now 118 here, no medications today - I initially planned for orthostatic vitals today but will keep BP log instead - she has not taken medications today - she does not take losartan  and toprol  at the same time every day, encouraged  a routine schedule for these, even if at night   CAD DES-LAD, DES-LCx 01/2023 - Remains on DAPT with aspirin  and Effient  - Had dyspnea with Brilinta  -- continue DAPT   Hypertension - 50 mg losartan , 25 mg Toprol  -- BP log as above   Hyperlipidemia with LDL goal less than 55 - Consider lower LDL goal given history of tobacco abuse 02/07/2023: Cholesterol 125; HDL 33; LDL Cholesterol 62; Triglycerides 151; VLDL 30 - Med list shows a 5 mg Crestor  (she changed because of research on the internet) - I will increase to 20 mg crestor       Follow up with Dr. Berry Bristol as scheduled.     Medication Adjustments/Labs and Tests Ordered: Current medicines are reviewed at length with the patient today.  Concerns regarding medicines are outlined above.  Orders Placed This Encounter  Procedures   Ambulatory referral to Pulmonology   EKG 12-Lead   Meds ordered this encounter  Medications   rosuvastatin  (CRESTOR ) 20 MG tablet    Sig: Take 1 tablet (20 mg total) by mouth daily.    Dispense:  90 tablet    Refill:  3    Patient Instructions  Medication Instructions:  Take the Rosuvastatin  20mg   daily.  Log blood pressures daily. Bring blood pressure log with you to your next office appointment. . *If you need a refill on your cardiac medications before your next appointment, please call your pharmacy*   Lab Work: No labs were ordered during today's visit.  If you have labs (blood work) drawn today and your tests are completely normal, you will receive your results only by: MyChart Message (if you have MyChart) OR A paper copy in the mail If you have any lab test that is abnormal or we need to change your treatment, we will call you to review the results.   Testing/Procedures: No procedures were ordered during today's visit.    Follow-Up: At [facility], you and your health needs are our priority.  As part of our continuing mission to provide you with exceptional heart  care, we have created designated Provider Care Teams.  These Care Teams include your primary Cardiologist (physician) and Advanced Practice Providers (APPs -  Physician Assistants and Nurse Practitioners) who all work together to provide you with the care you need, when you need it.  We recommend signing up for the patient portal called MyChart.  Sign up information is provided on this After Visit Summary.  MyChart is used to connect with patients for Virtual Visits (Telemedicine).  Patients are able to view lab/test results, encounter notes, upcoming appointments, etc.  Non-urgent messages can be sent to your provider as well.   To learn more about what you can do with MyChart, go to [email or web address].         Other Instructions Keep scheduled appointment    Signed, Lamond Pilot, [place]  08/03/2023 2:48 PM    [facility]

## 2023-08-03 NOTE — Patient Instructions (Addendum)
 Medication Instructions:  Take the Rosuvastatin  20mg  daily.  Log blood pressures daily. Bring blood pressure log with you to your next office appointment. . *If you need a refill on your cardiac medications before your next appointment, please call your pharmacy*   Lab Work: No labs were ordered during today's visit.  If you have labs (blood work) drawn today and your tests are completely normal, you will receive your results only by: MyChart Message (if you have MyChart) OR A paper copy in the mail If you have any lab test that is abnormal or we need to change your treatment, we will call you to review the results.   Testing/Procedures: No procedures were ordered during today's visit.    Follow-Up: At [facility], you and your health needs are our priority.  As part of our continuing mission to provide you with exceptional heart care, we have created designated Provider Care Teams.  These Care Teams include your primary Cardiologist (physician) and Advanced Practice Providers (APPs -  Physician Assistants and Nurse Practitioners) who all work together to provide you with the care you need, when you need it.  We recommend signing up for the patient portal called MyChart.  Sign up information is provided on this After Visit Summary.  MyChart is used to connect with patients for Virtual Visits (Telemedicine).  Patients are able to view lab/test results, encounter notes, upcoming appointments, etc.  Non-urgent messages can be sent to your provider as well.   To learn more about what you can do with MyChart, go to [email or web address].         Other Instructions Keep scheduled appointment

## 2023-08-06 ENCOUNTER — Other Ambulatory Visit (HOSPITAL_COMMUNITY): Payer: Self-pay

## 2023-08-11 ENCOUNTER — Other Ambulatory Visit (HOSPITAL_COMMUNITY): Payer: Self-pay

## 2023-08-11 DIAGNOSIS — R109 Unspecified abdominal pain: Secondary | ICD-10-CM | POA: Diagnosis not present

## 2023-08-11 DIAGNOSIS — Z9049 Acquired absence of other specified parts of digestive tract: Secondary | ICD-10-CM | POA: Diagnosis not present

## 2023-08-11 DIAGNOSIS — M542 Cervicalgia: Secondary | ICD-10-CM | POA: Diagnosis not present

## 2023-08-12 ENCOUNTER — Other Ambulatory Visit (HOSPITAL_COMMUNITY): Payer: Self-pay

## 2023-08-20 DIAGNOSIS — H903 Sensorineural hearing loss, bilateral: Secondary | ICD-10-CM | POA: Diagnosis not present

## 2023-08-24 ENCOUNTER — Other Ambulatory Visit (HOSPITAL_COMMUNITY): Payer: Self-pay

## 2023-08-24 DIAGNOSIS — M545 Low back pain, unspecified: Secondary | ICD-10-CM | POA: Diagnosis not present

## 2023-08-24 DIAGNOSIS — G8929 Other chronic pain: Secondary | ICD-10-CM | POA: Diagnosis not present

## 2023-08-24 DIAGNOSIS — M7071 Other bursitis of hip, right hip: Secondary | ICD-10-CM | POA: Diagnosis not present

## 2023-08-24 DIAGNOSIS — M7072 Other bursitis of hip, left hip: Secondary | ICD-10-CM | POA: Diagnosis not present

## 2023-08-24 DIAGNOSIS — R030 Elevated blood-pressure reading, without diagnosis of hypertension: Secondary | ICD-10-CM | POA: Diagnosis not present

## 2023-08-24 DIAGNOSIS — J439 Emphysema, unspecified: Secondary | ICD-10-CM | POA: Diagnosis not present

## 2023-08-24 DIAGNOSIS — M5030 Other cervical disc degeneration, unspecified cervical region: Secondary | ICD-10-CM | POA: Diagnosis not present

## 2023-08-24 DIAGNOSIS — I10 Essential (primary) hypertension: Secondary | ICD-10-CM | POA: Diagnosis not present

## 2023-08-24 DIAGNOSIS — M549 Dorsalgia, unspecified: Secondary | ICD-10-CM | POA: Diagnosis not present

## 2023-08-24 DIAGNOSIS — Z79899 Other long term (current) drug therapy: Secondary | ICD-10-CM | POA: Diagnosis not present

## 2023-08-24 DIAGNOSIS — M2578 Osteophyte, vertebrae: Secondary | ICD-10-CM | POA: Diagnosis not present

## 2023-08-24 MED ORDER — OXYCODONE-ACETAMINOPHEN 5-325 MG PO TABS
1.0000 | ORAL_TABLET | Freq: Three times a day (TID) | ORAL | 0 refills | Status: DC | PRN
  Filled 2023-09-05 – 2023-09-14 (×2): qty 90, 30d supply, fill #0

## 2023-08-24 MED ORDER — LIDOCAINE 5 % EX PTCH
1.0000 | MEDICATED_PATCH | Freq: Every day | CUTANEOUS | 0 refills | Status: DC
  Filled 2023-08-24: qty 10, 10d supply, fill #0

## 2023-08-26 ENCOUNTER — Other Ambulatory Visit (HOSPITAL_COMMUNITY): Payer: Self-pay

## 2023-08-27 ENCOUNTER — Encounter: Payer: Self-pay | Admitting: Cardiology

## 2023-08-27 ENCOUNTER — Ambulatory Visit: Attending: Cardiology | Admitting: Cardiology

## 2023-08-27 VITALS — BP 134/68 | HR 59 | Resp 16 | Ht 63.0 in | Wt 137.0 lb

## 2023-08-27 DIAGNOSIS — R0609 Other forms of dyspnea: Secondary | ICD-10-CM

## 2023-08-27 DIAGNOSIS — I25118 Atherosclerotic heart disease of native coronary artery with other forms of angina pectoris: Secondary | ICD-10-CM

## 2023-08-27 DIAGNOSIS — E782 Mixed hyperlipidemia: Secondary | ICD-10-CM | POA: Diagnosis not present

## 2023-08-27 DIAGNOSIS — I10 Essential (primary) hypertension: Secondary | ICD-10-CM

## 2023-08-27 MED ORDER — LOSARTAN POTASSIUM 100 MG PO TABS: 100.0000 mg | ORAL_TABLET | Freq: Every day | ORAL | Status: DC

## 2023-08-27 NOTE — Progress Notes (Signed)
 Cardiology Office Note:  .   Date:  08/27/2023  ID:  Amanda Kim, DOB [birth year], MRN [id number] PCP: Amanda Lombard, MD  [facility] Providers Cardiologist:  Amanda Bergamo, MD   History of Present Illness: .   Amanda Kim is a 78 y.o. Caucasian female patient with hypothyroidism, esophageal dysmotility and spasms, tobacco use disorder quit on 04/25/23, chronic back pain SP spinal stimulator placement presented to the emergency department on 02/06/2023 with chest pain and found to have NSTEMI and underwent successful angioplasty and stenting to proximal LAD and mid circumflex.  Echocardiogram on 01/18/2023 revealing normal LVEF.  Discussed the use of AI scribe software for clinical note transcription with the patient, who gave verbal consent to proceed.  History of Present Illness Amanda Kim is a 78 year old female with coronary artery disease who presents for follow-up of CAD and hypercholesterolemia.  Stents were placed in the left anterior descending and circumflex coronary arteries following a myocardial infarction on February 10, 2023. Since the procedure, she has not experienced chest pain and has not used nitroglycerin . She occasionally experiences sudden shortness of breath, which does not hinder her ability to walk or perform daily activities. She uses over-the-counter oxygen  as needed.  Current medications include aspirin  daily, Effient  10 mg once a day, Crestor  20 mg once a day, metoprolol  succinate 25 mg once a day, losartan  50 mg once a day, and amlodipine  10 mg once a day. Her losartan  dose was previously reduced to 25 mg due to hypotension, but she is now taking 50 mg daily as her blood pressure has stabilized around 130-140 mmHg.  She has a family history of similar cardiac conditions, as her sister had similar heart issues.  Labs   Lab Results  Component Value Date   CHOL 125 02/07/2023   HDL 33 (L) 02/07/2023   LDLCALC 62  02/07/2023   TRIG 151 (H) 02/07/2023   CHOLHDL 3.8 02/07/2023   Lipoprotein (a)  Date/Time Value Ref Range Status  02/07/2023 02:20 AM 31.4 (H) <75.0 nmol/L Final    Comment:    (NOTE) Note:  Values greater than or equal to 75.0 nmol/L may       indicate an independent risk factor for CHD,       but must be evaluated with caution when applied       to non-Caucasian populations due to the       influence of genetic factors on Lp(a) across       ethnicities. Performed At: [facility] [address] [place], [place] [id number] Jennette Shorter MD Ey:[id number]     Lab Results  Component Value Date   NA 137 03/30/2023   K 4.1 03/30/2023   CO2 21 (L) 03/30/2023   GLUCOSE 128 (H) 03/30/2023   BUN 14 03/30/2023   CREATININE 0.70 03/30/2023   CALCIUM  9.4 03/30/2023   GFRNONAA >60 03/30/2023      Latest Ref Rng & Units 03/30/2023    7:58 PM 02/07/2023   10:36 AM 02/07/2023    2:20 AM  BMP  Glucose 70 - 99 mg/dL 871  880  877   BUN 8 - 23 mg/dL 14  8  9    Creatinine 0.44 - 1.00 mg/dL 9.29  9.44  9.38   Sodium 135 - 145 mmol/L 137  136  141   Potassium 3.5 - 5.1 mmol/L 4.1  3.4  3.0   Chloride 98 - 111 mmol/L 103  103  106  CO2 22 - 32 mmol/L 21  26  24    Calcium  8.9 - 10.3 mg/dL 9.4  9.3  8.9       Latest Ref Rng & Units 03/30/2023    7:58 PM 02/07/2023    2:20 AM 02/06/2023    4:46 AM  CBC  WBC 4.0 - 10.5 K/uL 13.9  11.9  14.1   Hemoglobin 12.0 - 15.0 g/dL 87.0  86.2  85.7   Hematocrit 36.0 - 46.0 % 38.0  40.8  42.4   Platelets 150 - 400 K/uL 292  257  324    Lab Results  Component Value Date   HGBA1C 5.9 09/11/2011    Lab Results  Component Value Date   TSH 5.324 (H) 12/14/2012    ROS  Review of Systems  Cardiovascular:  Positive for dyspnea on exertion (occasional). Negative for chest pain and leg swelling.   Physical Exam:   VS:  BP 134/68 (BP Location: Left Arm, Patient Position: Sitting, Cuff Size: Normal)   Pulse (!) 59   Resp 16   Ht 5' 3  (1.6 m)   Wt 137 lb (62.1 kg)   SpO2 95%   BMI 24.27 kg/m    Wt Readings from Last 3 Encounters:  08/27/23 137 lb (62.1 kg)  08/03/23 130 lb 3.2 oz (59.1 kg)  05/15/23 129 lb 6.4 oz (58.7 kg)    Physical Exam Neck:     Vascular: No carotid bruit or JVD.  Cardiovascular:     Rate and Rhythm: Normal rate and regular rhythm.     Pulses: Intact distal pulses.     Heart sounds: Normal heart sounds. No murmur heard.    No gallop.  Pulmonary:     Effort: Pulmonary effort is normal.     Breath sounds: Normal breath sounds.  Abdominal:     General: Bowel sounds are normal.     Palpations: Abdomen is soft.  Musculoskeletal:     Right lower leg: No edema.     Left lower leg: No edema.    Studies Reviewed: SABRA    Left Heart Catheterization 02/06/23 Proximal LAD stenosis 3.0 x 32 mm Synergy DES Mid circumflex/first OM 3.0 x 16 mm Synergy DES      DAPT with aspirin  and ticagrelor  x 12 months.  Aggressive risk reduction, tobacco cessation.  Okay for discharge tomorrow as long as no complications arise. EKG:         Medications ordered    Meds ordered this encounter  Medications   losartan  (COZAAR ) 100 MG tablet    Sig: Take 1 tablet (100 mg total) by mouth daily.     ASSESSMENT AND PLAN: .      ICD-10-CM   1. Coronary artery disease of native artery of native heart with stable angina pectoris (HCC)  I25.118     2. Dyspnea on exertion  R06.09     3. Primary hypertension  I10 losartan  (COZAAR ) 100 MG tablet    4. Mixed hyperlipidemia  E78.2       Assessment and Plan Assessment & Plan Coronary artery disease with stents Coronary artery disease with stents placed in the LAD and circumflex coronary arteries following a myocardial infarction in December 2024. Currently asymptomatic with well-controlled heart rate and cholesterol levels. - Continue aspirin  daily - Continue Effient  10 mg daily - Continue Crestor  20 mg daily - Continue metoprolol  succinate 25 mg daily -  Consider discontinuing Effient  and aspirin  in December 2025 and consider switching to  Plavix 75 mg daily  Shortness of breath Intermittent shortness of breath without signs of heart failure or pulmonary edema.  Symptoms are vague, able to do all activities of daily living and exercise without dyspnea but occasionally gets short of breath when she is sitting for a brief time.  Hypertension Hypertension managed with losartan  and amlodipine . Blood pressure previously low, leading to reduced losartan  dosage. Current blood pressure is 130-140 mmHg, suggesting a need to return to previous dosage. - Increase losartan  to 100 mg daily.  Losartan  previously on last office visit a month ago was reduced to 50 mg due to low blood pressure but patient has noticed elevated blood pressure. - Continue amlodipine  10 mg daily - Monitor blood pressure and adjust losartan  dosage if symptoms of dizziness or hypotension occur  Hyperlipidemia Hyperlipidemia well controlled with current medication regimen. Recent cholesterol panel shows LDL at 62, below the target of less than 70. - Continue Crestor  20 mg daily  Office visit in 6 months.   Signed,  Amanda Bergamo, MD, [facility] 08/27/2023, 6:44 PM [facility] [address] [place], [place] [address] Phone: [phone]. Fax:  [phone]

## 2023-08-27 NOTE — Patient Instructions (Signed)
 Medication Instructions:  Change Losartan  to 100 mg by mouth daily  *If you need a refill on your cardiac medications before your next appointment, please call your pharmacy*  Lab Work: none If you have labs (blood work) drawn today and your tests are completely normal, you will receive your results only by: MyChart Message (if you have MyChart) OR A paper copy in the mail If you have any lab test that is abnormal or we need to change your treatment, we will call you to review the results.  Testing/Procedures: none  Follow-Up: At [facility], you and your health needs are our priority.  As part of our continuing mission to provide you with exceptional heart care, our providers are all part of one team.  This team includes your primary Cardiologist (physician) and Advanced Practice Providers or APPs (Physician Assistants and Nurse Practitioners) who all work together to provide you with the care you need, when you need it.  Your next appointment:   6 month(s)  Provider:   Gordy Bergamo, MD    We recommend signing up for the patient portal called MyChart.  Sign up information is provided on this After Visit Summary.  MyChart is used to connect with patients for Virtual Visits (Telemedicine).  Patients are able to view lab/test results, encounter notes, upcoming appointments, etc.  Non-urgent messages can be sent to your provider as well.   To learn more about what you can do with MyChart, go to [email or web address].   Other Instructions

## 2023-08-28 DIAGNOSIS — Z79899 Other long term (current) drug therapy: Secondary | ICD-10-CM | POA: Diagnosis not present

## 2023-09-02 ENCOUNTER — Other Ambulatory Visit (HOSPITAL_COMMUNITY): Payer: Self-pay

## 2023-09-03 DIAGNOSIS — M542 Cervicalgia: Secondary | ICD-10-CM | POA: Diagnosis not present

## 2023-09-03 DIAGNOSIS — H40013 Open angle with borderline findings, low risk, bilateral: Secondary | ICD-10-CM | POA: Diagnosis not present

## 2023-09-03 DIAGNOSIS — H35363 Drusen (degenerative) of macula, bilateral: Secondary | ICD-10-CM | POA: Diagnosis not present

## 2023-09-03 DIAGNOSIS — H40053 Ocular hypertension, bilateral: Secondary | ICD-10-CM | POA: Diagnosis not present

## 2023-09-05 ENCOUNTER — Encounter: Payer: Self-pay | Admitting: Cardiology

## 2023-09-05 ENCOUNTER — Other Ambulatory Visit (HOSPITAL_COMMUNITY): Payer: Self-pay

## 2023-09-05 DIAGNOSIS — I10 Essential (primary) hypertension: Secondary | ICD-10-CM

## 2023-09-05 DIAGNOSIS — I25118 Atherosclerotic heart disease of native coronary artery with other forms of angina pectoris: Secondary | ICD-10-CM

## 2023-09-08 DIAGNOSIS — H6120 Impacted cerumen, unspecified ear: Secondary | ICD-10-CM | POA: Diagnosis not present

## 2023-09-10 ENCOUNTER — Other Ambulatory Visit: Payer: Self-pay | Admitting: Neurosurgery

## 2023-09-11 ENCOUNTER — Telehealth: Payer: Self-pay | Admitting: *Deleted

## 2023-09-11 NOTE — Telephone Encounter (Signed)
   Pre-operative Risk Assessment    Patient Name: Billye Pickerel  DOB: 09-Apr-1945 MRN: [id number]   Date of last office visit: 08/27/23 DR. GANJI Date of next office visit: NONE   Request for Surgical Clearance    Procedure:  C3-4, C4-5, C5-6 ANTERIOR CERVICAL FUSION  Date of Surgery:  Clearance 11/02/23                                Surgeon:  DR. ARLEY HELLING Surgeon's Group or Practice Name:  [facility] Phone number:  [phone] ATT: VANESSA  EXT 8244 Fax number:  [phone] ATT: VANESSA    Type of Clearance Requested:   - Medical  - Pharmacy:  Hold Aspirin  and Prasugrel  (Effient )     Type of Anesthesia:  General    Additional requests/questions:    Bonney Niels Jest   09/11/2023, 1:18 PM

## 2023-09-11 NOTE — Telephone Encounter (Signed)
   Name:  Amanda Kim  DOB:  [birth year]  MRN:  [id number]   Primary Cardiologist: Gordy Bergamo, MD  Chart reviewed as part of pre-operative protocol coverage. Patient was contacted 09/11/2023 in reference to pre-operative risk assessment for pending surgery as outlined below.    Amanda Kim was last seen on 08/27/2023 by [facility].  Since that day, Amanda Kim has done well. However, she had stents to severely stenosed mid circumflex artery and first OM in February 06, 2023. She will need to remain on Effient  and ASA until December of 2025 and not hold for elective procedures.  If this is emergent, please reach out to [facility] or move forward with holding antiplatelets with knowledge of possible thrombus risk.     Amanda Satterfield, NP 09/11/2023, 1:43 PM

## 2023-09-14 ENCOUNTER — Other Ambulatory Visit (HOSPITAL_COMMUNITY): Payer: Self-pay

## 2023-09-15 ENCOUNTER — Other Ambulatory Visit (HOSPITAL_COMMUNITY): Payer: Self-pay

## 2023-09-15 MED ORDER — DULOXETINE HCL 60 MG PO CPEP
60.0000 mg | ORAL_CAPSULE | Freq: Every day | ORAL | 1 refills | Status: DC
  Filled 2023-09-15: qty 90, 90d supply, fill #0
  Filled 2023-10-26 – 2023-12-17 (×2): qty 90, 90d supply, fill #1

## 2023-09-21 ENCOUNTER — Other Ambulatory Visit (HOSPITAL_COMMUNITY): Payer: Self-pay

## 2023-09-21 ENCOUNTER — Encounter (HOSPITAL_COMMUNITY): Payer: Self-pay

## 2023-09-24 ENCOUNTER — Other Ambulatory Visit (HOSPITAL_COMMUNITY): Payer: Self-pay

## 2023-09-24 DIAGNOSIS — J439 Emphysema, unspecified: Secondary | ICD-10-CM | POA: Diagnosis not present

## 2023-09-24 DIAGNOSIS — G8929 Other chronic pain: Secondary | ICD-10-CM | POA: Diagnosis not present

## 2023-09-24 DIAGNOSIS — M2578 Osteophyte, vertebrae: Secondary | ICD-10-CM | POA: Diagnosis not present

## 2023-09-24 DIAGNOSIS — M7071 Other bursitis of hip, right hip: Secondary | ICD-10-CM | POA: Diagnosis not present

## 2023-09-24 DIAGNOSIS — Z79899 Other long term (current) drug therapy: Secondary | ICD-10-CM | POA: Diagnosis not present

## 2023-09-24 DIAGNOSIS — M545 Low back pain, unspecified: Secondary | ICD-10-CM | POA: Diagnosis not present

## 2023-09-24 DIAGNOSIS — M5030 Other cervical disc degeneration, unspecified cervical region: Secondary | ICD-10-CM | POA: Diagnosis not present

## 2023-09-24 DIAGNOSIS — I10 Essential (primary) hypertension: Secondary | ICD-10-CM | POA: Diagnosis not present

## 2023-09-24 DIAGNOSIS — M549 Dorsalgia, unspecified: Secondary | ICD-10-CM | POA: Diagnosis not present

## 2023-09-24 DIAGNOSIS — R030 Elevated blood-pressure reading, without diagnosis of hypertension: Secondary | ICD-10-CM | POA: Diagnosis not present

## 2023-09-24 DIAGNOSIS — M7072 Other bursitis of hip, left hip: Secondary | ICD-10-CM | POA: Diagnosis not present

## 2023-09-24 MED ORDER — LIDOCAINE 5 % EX PTCH
1.0000 | MEDICATED_PATCH | Freq: Every day | CUTANEOUS | 0 refills | Status: AC
  Filled 2023-09-24: qty 10, 10d supply, fill #0

## 2023-09-24 MED ORDER — OXYCODONE-ACETAMINOPHEN 5-325 MG PO TABS
1.0000 | ORAL_TABLET | Freq: Every day | ORAL | 0 refills | Status: DC | PRN
  Filled 2023-09-24 – 2023-10-07 (×3): qty 150, 30d supply, fill #0

## 2023-09-25 ENCOUNTER — Other Ambulatory Visit (HOSPITAL_COMMUNITY): Payer: Self-pay

## 2023-09-28 ENCOUNTER — Other Ambulatory Visit (HOSPITAL_COMMUNITY): Payer: Self-pay

## 2023-09-28 ENCOUNTER — Other Ambulatory Visit: Payer: Self-pay | Admitting: Cardiology

## 2023-09-28 DIAGNOSIS — Z79899 Other long term (current) drug therapy: Secondary | ICD-10-CM | POA: Diagnosis not present

## 2023-09-28 MED ORDER — HYDRALAZINE HCL 25 MG PO TABS
25.0000 mg | ORAL_TABLET | ORAL | 1 refills | Status: DC
  Filled 2023-09-28: qty 90, 45d supply, fill #0

## 2023-09-28 MED ORDER — METOPROLOL SUCCINATE ER 25 MG PO TB24
25.0000 mg | ORAL_TABLET | Freq: Every day | ORAL | 3 refills | Status: AC
  Filled 2023-09-28: qty 90, 90d supply, fill #0
  Filled 2023-10-26 – 2024-01-01 (×2): qty 90, 90d supply, fill #1
  Filled 2024-01-25: qty 90, 90d supply, fill #2
  Filled 2024-03-17 – 2024-03-18 (×2): qty 90, 90d supply, fill #0

## 2023-09-28 NOTE — Telephone Encounter (Signed)
 ICD-10-CM   1. Primary hypertension  I10 hydrALAZINE  (APRESOLINE ) 25 MG tablet     Meds ordered this encounter  Medications   hydrALAZINE  (APRESOLINE ) 25 MG tablet    Sig: Take 1 tablet (25 mg total) by mouth as directed. Take 1 to 2 tablets as needed for SBP >150 mmHg    Dispense:  90 tablet    Refill:  1

## 2023-09-28 NOTE — Addendum Note (Signed)
 Addended by: LADONA MILAN on: 09/28/2023 06:50 PM   Modules accepted: Orders

## 2023-09-29 ENCOUNTER — Other Ambulatory Visit (HOSPITAL_COMMUNITY): Payer: Self-pay

## 2023-10-02 ENCOUNTER — Other Ambulatory Visit (HOSPITAL_COMMUNITY): Payer: Self-pay

## 2023-10-05 ENCOUNTER — Other Ambulatory Visit (HOSPITAL_COMMUNITY): Payer: Self-pay

## 2023-10-05 MED ORDER — LOSARTAN POTASSIUM 100 MG PO TABS
100.0000 mg | ORAL_TABLET | Freq: Every day | ORAL | 0 refills | Status: DC
  Filled 2023-10-05: qty 30, 30d supply, fill #0

## 2023-10-05 MED ORDER — LEVOTHYROXINE SODIUM 100 MCG PO TABS
100.0000 ug | ORAL_TABLET | Freq: Every morning | ORAL | 0 refills | Status: DC
  Filled 2023-10-05 – 2023-10-26 (×2): qty 30, 30d supply, fill #0

## 2023-10-06 ENCOUNTER — Other Ambulatory Visit: Payer: Self-pay

## 2023-10-07 ENCOUNTER — Other Ambulatory Visit (HOSPITAL_COMMUNITY): Payer: Self-pay

## 2023-10-08 ENCOUNTER — Encounter: Payer: Self-pay | Admitting: Internal Medicine

## 2023-10-08 ENCOUNTER — Other Ambulatory Visit (HOSPITAL_COMMUNITY): Payer: Self-pay

## 2023-10-08 ENCOUNTER — Ambulatory Visit (INDEPENDENT_AMBULATORY_CARE_PROVIDER_SITE_OTHER): Admitting: Internal Medicine

## 2023-10-08 VITALS — BP 124/68 | HR 70 | Ht 63.0 in | Wt 141.0 lb

## 2023-10-08 DIAGNOSIS — Z87891 Personal history of nicotine dependence: Secondary | ICD-10-CM

## 2023-10-08 DIAGNOSIS — J439 Emphysema, unspecified: Secondary | ICD-10-CM

## 2023-10-08 DIAGNOSIS — R0609 Other forms of dyspnea: Secondary | ICD-10-CM

## 2023-10-08 DIAGNOSIS — R0989 Other specified symptoms and signs involving the circulatory and respiratory systems: Secondary | ICD-10-CM | POA: Diagnosis not present

## 2023-10-08 DIAGNOSIS — R7989 Other specified abnormal findings of blood chemistry: Secondary | ICD-10-CM

## 2023-10-08 DIAGNOSIS — Z122 Encounter for screening for malignant neoplasm of respiratory organs: Secondary | ICD-10-CM

## 2023-10-08 DIAGNOSIS — Z889 Allergy status to unspecified drugs, medicaments and biological substances status: Secondary | ICD-10-CM

## 2023-10-08 MED ORDER — SPIRIVA RESPIMAT 1.25 MCG/ACT IN AERS
2.0000 | INHALATION_SPRAY | Freq: Every day | RESPIRATORY_TRACT | 6 refills | Status: AC
  Filled 2023-10-08: qty 4, 30d supply, fill #0
  Filled 2023-11-02: qty 4, 30d supply, fill #1
  Filled 2023-11-30 – 2023-12-10 (×2): qty 4, 30d supply, fill #2
  Filled 2024-02-19: qty 4, 30d supply, fill #3

## 2023-10-08 NOTE — Progress Notes (Signed)
 OV 10/08/2023  Subjective:  Patient ID: Amanda Cleotilde Pugh, female , DOB: 1945/07/28 , age 78 y.o. , MRN: [id number] , ADDRESS: [address] [place] [address] PCP Verdia Lombard, MD Patient Care Team: Verdia Lombard, MD as PCP - General (Internal Medicine) Ladona Heinz, MD as PCP - Cardiology (Cardiology)  This Provider for this visit: Treatment Team:  Attending Provider: Mannam, Praveen, MD    10/08/2023 -   Chief Complaint  Patient presents with   Consult   Shortness of Breath    Referred by Jon Hails, PA     HPI Amanda Kim 78 y.o. -new consult referred by Dr. Verdia because of shortness of breath and concern of emphysema.  She was a heavy smoker but quit in length of 2025.  On February 14, 2023 she was admitted to the hospital because of non-STEMI and she is status post drug-eluting stent.  She then follows with Dr. Ladona.  Early 2025 her BNP was elevated.  High echocardiogram on the day of the myocardial infarction was normal.  She says some 2 months after being treated for MI she started noticing insidious onset of shortness of breath even for minimal exertion.  This persisted up until last week and since then she is somewhat spontaneously better.  She says she gets short of breath walking uphill with the dog but sometimes even going from the chair to the room.  Is extremely variable there is no cough or wheezing.  In length of 2025 she quit smoking and decided to put her health back together.  She had lung cancer screening CT March 2025 and this showed some emphysema and that is why she is here.  There is no associated cough  Other review of systems - No chronic cough - She has chronic runny nose.  She says previous history of seasonal allergies the runny nose is there for 1 year.  25 years ago she had skin test taking in [facility]  but she does not know the results - She has chronic back pain and she has spinal cord stimulator  in the back after her horse fell on her in [place]  - She also has chronic neck pain and she is awaiting C-spine surgery by Dr. Arley cram but this only after cardiac clearance by Dr. Ladona.    CT Chest data from date: mrch 2025  - personally visualized and independently interpreted : NO - my findings are: as reported   IMPRESSION: 1. Lung-RADS 2, benign appearance or behavior. Continue annual screening with low-dose chest CT without contrast in 12 months. 2. Aortic atherosclerosis (ICD10-I70.0). Coronary artery calcification. 3.  Emphysema (ICD10-J43.9).     Electronically Signed   By: Newell Eke M.D.   On: 05/13/2023 13:18   PFT      No data to display             LAB RESULTS last 96 hours No results found.    IMPRESSIONS echocardiogram February 07, 2023.    1. Left ventricular ejection fraction, by estimation, is 55 to 60%. The  left ventricle has normal function. The left ventricle has no regional  wall motion abnormalities. Left ventricular diastolic parameters are  indeterminate.   2. Right ventricular systolic function is normal. The right ventricular  size is normal.   3. The mitral valve is normal in structure. No evidence of mitral valve  regurgitation. No evidence of mitral stenosis.   4. The aortic valve was not well  visualized. Aortic valve regurgitation  is not visualized. No aortic stenosis is present.   5. The inferior vena cava is normal in size with greater than 50%  respiratory variability, suggesting right atrial pressure of 3 mmHg.   Comparison(s): No prior Echocardiogram.     has a past medical history of Back pain, Bone spur, Chronic pain, Headache(784.0), History of colon polyps, Hyperlipidemia, Hypertension, Hypothyroid (05/07/2011), Leg cramps, Liver disease, Nausea & vomiting, Spondylolisthesis, Spondylosis of thoracolumbar region without myelopathy or radiculopathy, and Urinary frequency.   reports that she has quit smoking. Her  smoking use included cigarettes. She has a 36 pack-year smoking history. She has never used smokeless tobacco.  Past Surgical History:  Procedure Laterality Date   CESAREAN SECTION  1967, 1970   CHOLECYSTECTOMY N/A 05/20/2012   Procedure: LAPAROSCOPIC CHOLECYSTECTOMY WITH INTRAOPERATIVE CHOLANGIOGRAM;  Surgeon: Deward GORMAN Curvin DOUGLAS, MD;  Location: [facility] OR;  Service: General;  Laterality: N/A;   COLONOSCOPY     CORONARY STENT INTERVENTION N/A 02/06/2023   Procedure: CORONARY STENT INTERVENTION;  Surgeon: Wonda Sharper, MD;  Location: [facility] INVASIVE CV LAB;  Service: Cardiovascular;  Laterality: N/A;   CORONARY ULTRASOUND/IVUS N/A 02/06/2023   Procedure: Coronary Ultrasound/IVUS;  Surgeon: Wonda Sharper, MD;  Location: [facility] INVASIVE CV LAB;  Service: Cardiovascular;  Laterality: N/A;   LEFT HEART CATH AND CORONARY ANGIOGRAPHY N/A 02/06/2023   Procedure: LEFT HEART CATH AND CORONARY ANGIOGRAPHY;  Surgeon: Wonda Sharper, MD;  Location: [facility] INVASIVE CV LAB;  Service: Cardiovascular;  Laterality: N/A;   TUBAL LIGATION  1981    Allergies  Allergen Reactions   Sulfisoxazole Itching   Bee Venom Itching, Swelling and Rash   Penicillins Itching and Swelling    Immunization History  Administered Date(s) Administered   Influenza Whole 10/19/2011   Influenza,inj,Quad PF,6+ Mos 11/04/2013   Influenza-Unspecified 11/17/2012   PFIZER(Purple Top)SARS-COV-2 Vaccination 03/28/2019, 04/22/2019, 10/18/2019   Pneumococcal Conjugate-13 04/10/2017   Pneumococcal Polysaccharide-23 06/18/2011    Family History  Problem Relation Age of Onset   Hypertension Mother    Alcohol  abuse Mother    Depression Father    Bipolar disorder Father    Suicidality Sister    Heart disease Paternal Uncle    Hyperlipidemia Paternal Uncle    Colon cancer Paternal Grandmother    Cancer Paternal Grandmother        colon   Heart attack Paternal Grandfather      Current Outpatient Medications:    amLODipine  (NORVASC ) 10 MG  tablet, Take 1 tablet (10 mg total) by mouth daily., Disp: 90 tablet, Rfl: 3   aspirin  EC 81 MG tablet, Take 1 tablet (81 mg total) by mouth daily. Swallow whole., Disp: 30 tablet, Rfl: 11   CALCIUM  PO, Take 1 tablet by mouth daily., Disp: , Rfl:    COLOSTRUM PO, Take 1 Dose by mouth 4 (four) times a week., Disp: , Rfl:    DULoxetine  (CYMBALTA ) 60 MG capsule, Take 1 capsule (60 mg total) by mouth daily., Disp: 90 capsule, Rfl: 1   Estradiol  (VAGIFEM ) 10 MCG TABS vaginal tablet, Place 10 mcg vaginally 2 (two) times a week., Disp: , Rfl:    gabapentin  (NEURONTIN ) 300 MG capsule, Take 1 capsule (300 mg total) by mouth 3 (three) times daily., Disp: 90 capsule, Rfl: 3   hydrALAZINE  (APRESOLINE ) 25 MG tablet, Take 1 to 2 tablets as needed for SBP >150 mmHg, Disp: 90 tablet, Rfl: 1   hydrOXYzine  (ATARAX ) 25 MG tablet, Take 25 mg by mouth 3 (three) times  daily as needed for anxiety (or sleep)., Disp: , Rfl:    levothyroxine  (SYNTHROID ) 100 MCG tablet, Take 1 tablet (100 mcg total) by mouth in the morning on an empty stomach., Disp: 90 tablet, Rfl: 3   lidocaine  (LIDODERM ) 5 %, Apply 1 patch to skin per package directions, leave on most painful area for up to 12 hours. Max 1 patch daily, Disp: 10 patch, Rfl: 0   losartan  (COZAAR ) 100 MG tablet, Take 1 tablet (100 mg total) by mouth daily., Disp: , Rfl:    losartan  (COZAAR ) 100 MG tablet, Take 1 tablet (100 mg total) by mouth daily., Disp: 30 tablet, Rfl: 0   metoprolol  succinate (TOPROL  XL) 25 MG 24 hr tablet, Take 1 tablet (25 mg total) by mouth daily., Disp: 90 tablet, Rfl: 3   nitroGLYCERIN  (NITROSTAT ) 0.4 MG SL tablet, Place 1 tablet (0.4 mg total) under the tongue every 5 (five) minutes as needed for chest pain., Disp: 25 tablet, Rfl: 3   OVER THE COUNTER MEDICATION, Take 1 Dose by mouth at bedtime. Sound Asleep Gummy, Disp: , Rfl:    oxyCODONE -acetaminophen  (PERCOCET/ROXICET) 5-325 MG tablet, Take 1 tablet by mouth 5 (five) times daily as needed for  pain., Disp: 150 tablet, Rfl: 0   prasugrel  (EFFIENT ) 10 MG TABS tablet, Take 1 tablet (10 mg total) by mouth daily., Disp: 90 tablet, Rfl: 3   rosuvastatin  (CRESTOR ) 20 MG tablet, Take 1 tablet (20 mg total) by mouth daily., Disp: 90 tablet, Rfl: 3   Tiotropium Bromide  Monohydrate (SPIRIVA  RESPIMAT) 1.25 MCG/ACT AERS, Inhale 2 puffs into the lungs daily., Disp: 4 g, Rfl: 6   valACYclovir  (VALTREX ) 1000 MG tablet, Take 1,000 mg by mouth 2 (two) times daily as needed ([place])., Disp: , Rfl: 2   buPROPion  (WELLBUTRIN  SR) 150 MG 12 hr tablet, Take 1 tablet (150 mg total) by mouth 2 (two) times daily., Disp: 180 tablet, Rfl: 1   COLLAGEN PO, Take 1 Dose by mouth daily., Disp: , Rfl:    levothyroxine  (SYNTHROID ) 100 MCG tablet, Take 1 tablet (100 mcg total) by mouth every morning on an empty stomach., Disp: 30 tablet, Rfl: 0      Objective:   Vitals:   10/08/23 1505  BP: 124/68  Pulse: 70  SpO2: 97%  Weight: 141 lb (64 kg)  Height: 5' 3 (1.6 m)    Estimated body mass index is 24.98 kg/m as calculated from the following:   Height as of this encounter: 5' 3 (1.6 m).   Weight as of this encounter: 141 lb (64 kg).  @WEIGHTCHANGE @  [employer]   10/08/23 1505  Weight: 141 lb (64 kg)     Physical Exam   General: No distress. Looks wll O2 at rest: no Cane present: no Sitting in wheel chair: no Frail: non Obese: o Neuro: Alert and Oriented x 3. GCS 15. Speech normal Psych: Pleasant Resp:  Barrel Chest - no.  Wheeze - no, Crackles - non, No overt respiratory distress CVS: Normal heart sounds. Murmurs - no Ext: Stigmata of Connective Tissue Disease - no HEENT: Normal upper airway. PEERL +. No post nasal drip        Assessment/     Assessment & Plan DOE (dyspnea on exertion)  Pulmonary emphysema, unspecified emphysema type (HCC)  Elevated brain natriuretic peptide (BNP) level  Stopped smoking with greater than 30 pack year history  Screening for lung  cancer  Chronic sinus complaints  History of seasonal allergies    PLAN  Patient Instructions     ICD-10-CM   1. DOE (dyspnea on exertion)  R06.09 Alpha-1 antitrypsin phenotype    B Nat Peptide    CBC w/Diff    2. Pulmonary emphysema, unspecified emphysema type (HCC)  J43.9 Alpha-1 antitrypsin phenotype    B Nat Peptide    CBC w/Diff    3. Elevated brain natriuretic peptide (BNP) level  R79.89 Alpha-1 antitrypsin phenotype    B Nat Peptide    CBC w/Diff    4. Stopped smoking with greater than 30 pack year history  Z87.891 Alpha-1 antitrypsin phenotype    B Nat Peptide    CBC w/Diff    5. Screening for lung cancer  Z12.2 Alpha-1 antitrypsin phenotype    B Nat Peptide    CBC w/Diff    6. Chronic sinus complaints  R09.89 Alpha-1 antitrypsin phenotype    B Nat Peptide    CBC w/Diff    7. History of seasonal allergies  Z88.9 Alpha-1 antitrypsin phenotype    B Nat Peptide    CBC w/Diff     Dyspnea  EMphyesema History of BNP elevation  -  could be due to emphysema +/- diastolic dysfunction  Plan  - check PFT - check alpha 1 AT phenotype - check cbc wit diff - check bnp   Sinus drainage  Plan  - check RAST allergy panel   Lung cancer screening Quit smoking 2025 Lent  Plan - congratulation on quitting smoking - next CT chest march 2026; will order at followup   Preop Risk for neck surgery  - Low Moderate risk for pulmonary complication following neck surgery  Plan  - per surgeon  FOllowup - 2 months with app for result review    FOLLOWUP    Return in about 8 weeks (around 12/03/2023) for with any of the APPS, Face to Face Visit.    SIGNATURE    Dr. Dorethia Cave, M.D., F.C.C.P,  Pulmonary and Critical Care Medicine Staff Physician, [facility] Director - Interstitial Lung Disease  Program  Pulmonary Fibrosis [facility] at [facility] [place], [place], [address]  Pager: [phone], If no  answer or between  15:00h - 7:00h: call [phone] Telephone: [phone]  3:36 PM 10/08/2023

## 2023-10-08 NOTE — Patient Instructions (Addendum)
 ICD-10-CM   1. DOE (dyspnea on exertion)  R06.09 Alpha-1 antitrypsin phenotype    B Nat Peptide    CBC w/Diff    2. Pulmonary emphysema, unspecified emphysema type (HCC)  J43.9 Alpha-1 antitrypsin phenotype    B Nat Peptide    CBC w/Diff    3. Elevated brain natriuretic peptide (BNP) level  R79.89 Alpha-1 antitrypsin phenotype    B Nat Peptide    CBC w/Diff    4. Stopped smoking with greater than 30 pack year history  Z87.891 Alpha-1 antitrypsin phenotype    B Nat Peptide    CBC w/Diff    5. Screening for lung cancer  Z12.2 Alpha-1 antitrypsin phenotype    B Nat Peptide    CBC w/Diff    6. Chronic sinus complaints  R09.89 Alpha-1 antitrypsin phenotype    B Nat Peptide    CBC w/Diff    7. History of seasonal allergies  Z88.9 Alpha-1 antitrypsin phenotype    B Nat Peptide    CBC w/Diff     Dyspnea  EMphyesema History of BNP elevation  -  could be due to emphysema +/- diastolic dysfunction  Plan  - check PFT - check alpha 1 AT phenotype - check cbc wit diff - check bnp   Sinus drainage  Plan  - check RAST allergy panel   Lung cancer screening Quit smoking 2025 Lent  Plan - congratulation on quitting smoking - next CT chest march 2026; will order at followup   Preop Risk for neck surgery  - Low Moderate risk for pulmonary complication following neck surgery  Plan  - per surgeon  FOllowup - 2 months with app for result review

## 2023-10-09 LAB — CBC WITH DIFFERENTIAL/PLATELET
Basophils Absolute: 0 K/uL (ref 0.0–0.1)
Basophils Relative: 0.4 % (ref 0.0–3.0)
Eosinophils Absolute: 0.1 K/uL (ref 0.0–0.7)
Eosinophils Relative: 1.5 % (ref 0.0–5.0)
HCT: 42.1 % (ref 36.0–46.0)
Hemoglobin: 13.9 g/dL (ref 12.0–15.0)
Lymphocytes Relative: 25.9 % (ref 12.0–46.0)
Lymphs Abs: 2 K/uL (ref 0.7–4.0)
MCHC: 33.1 g/dL (ref 30.0–36.0)
MCV: 91.9 fl (ref 78.0–100.0)
Monocytes Absolute: 0.6 K/uL (ref 0.1–1.0)
Monocytes Relative: 7.5 % (ref 3.0–12.0)
Neutro Abs: 5.1 K/uL (ref 1.4–7.7)
Neutrophils Relative %: 64.7 % (ref 43.0–77.0)
Platelets: 294 K/uL (ref 150.0–400.0)
RBC: 4.58 Mil/uL (ref 3.87–5.11)
RDW: 13.3 % (ref 11.5–15.5)
WBC: 7.8 K/uL (ref 4.0–10.5)

## 2023-10-09 LAB — BRAIN NATRIURETIC PEPTIDE: Pro B Natriuretic peptide (BNP): 166 pg/mL — ABNORMAL HIGH (ref 0.0–100.0)

## 2023-10-13 LAB — ALPHA-1 ANTITRYPSIN PHENOTYPE: A-1 Antitrypsin, Ser: 146 mg/dL (ref 83–199)

## 2023-10-18 DIAGNOSIS — Z955 Presence of coronary angioplasty implant and graft: Secondary | ICD-10-CM | POA: Diagnosis not present

## 2023-10-18 DIAGNOSIS — F419 Anxiety disorder, unspecified: Secondary | ICD-10-CM | POA: Diagnosis not present

## 2023-10-18 DIAGNOSIS — I214 Non-ST elevation (NSTEMI) myocardial infarction: Secondary | ICD-10-CM | POA: Diagnosis not present

## 2023-10-18 DIAGNOSIS — I10 Essential (primary) hypertension: Secondary | ICD-10-CM | POA: Diagnosis not present

## 2023-10-18 DIAGNOSIS — I2510 Atherosclerotic heart disease of native coronary artery without angina pectoris: Secondary | ICD-10-CM | POA: Diagnosis not present

## 2023-10-18 DIAGNOSIS — E785 Hyperlipidemia, unspecified: Secondary | ICD-10-CM | POA: Diagnosis not present

## 2023-10-18 DIAGNOSIS — E039 Hypothyroidism, unspecified: Secondary | ICD-10-CM | POA: Diagnosis not present

## 2023-10-18 DIAGNOSIS — F32A Depression, unspecified: Secondary | ICD-10-CM | POA: Diagnosis not present

## 2023-10-18 DIAGNOSIS — G4733 Obstructive sleep apnea (adult) (pediatric): Secondary | ICD-10-CM | POA: Diagnosis not present

## 2023-10-18 DIAGNOSIS — G4700 Insomnia, unspecified: Secondary | ICD-10-CM | POA: Diagnosis not present

## 2023-10-18 DIAGNOSIS — G8929 Other chronic pain: Secondary | ICD-10-CM | POA: Diagnosis not present

## 2023-10-18 DIAGNOSIS — I252 Old myocardial infarction: Secondary | ICD-10-CM | POA: Diagnosis not present

## 2023-10-18 DIAGNOSIS — M1990 Unspecified osteoarthritis, unspecified site: Secondary | ICD-10-CM | POA: Diagnosis not present

## 2023-10-18 DIAGNOSIS — R9431 Abnormal electrocardiogram [ECG] [EKG]: Secondary | ICD-10-CM | POA: Diagnosis not present

## 2023-10-18 DIAGNOSIS — Z79899 Other long term (current) drug therapy: Secondary | ICD-10-CM | POA: Diagnosis not present

## 2023-10-18 DIAGNOSIS — F418 Other specified anxiety disorders: Secondary | ICD-10-CM | POA: Diagnosis not present

## 2023-10-18 DIAGNOSIS — Z79890 Hormone replacement therapy (postmenopausal): Secondary | ICD-10-CM | POA: Diagnosis not present

## 2023-10-18 DIAGNOSIS — R0789 Other chest pain: Secondary | ICD-10-CM | POA: Diagnosis not present

## 2023-10-19 DIAGNOSIS — F418 Other specified anxiety disorders: Secondary | ICD-10-CM | POA: Diagnosis not present

## 2023-10-19 DIAGNOSIS — G8929 Other chronic pain: Secondary | ICD-10-CM | POA: Diagnosis not present

## 2023-10-19 DIAGNOSIS — I2510 Atherosclerotic heart disease of native coronary artery without angina pectoris: Secondary | ICD-10-CM | POA: Diagnosis not present

## 2023-10-19 DIAGNOSIS — I214 Non-ST elevation (NSTEMI) myocardial infarction: Secondary | ICD-10-CM | POA: Diagnosis not present

## 2023-10-19 DIAGNOSIS — E039 Hypothyroidism, unspecified: Secondary | ICD-10-CM | POA: Diagnosis not present

## 2023-10-19 DIAGNOSIS — I10 Essential (primary) hypertension: Secondary | ICD-10-CM | POA: Diagnosis not present

## 2023-10-19 DIAGNOSIS — E785 Hyperlipidemia, unspecified: Secondary | ICD-10-CM | POA: Diagnosis not present

## 2023-10-26 ENCOUNTER — Ambulatory Visit: Admitting: Internal Medicine

## 2023-10-26 ENCOUNTER — Other Ambulatory Visit: Payer: Self-pay

## 2023-10-26 ENCOUNTER — Other Ambulatory Visit (HOSPITAL_COMMUNITY): Payer: Self-pay

## 2023-10-26 MED ORDER — LOSARTAN POTASSIUM 100 MG PO TABS
100.0000 mg | ORAL_TABLET | Freq: Every day | ORAL | 2 refills | Status: DC
  Filled 2023-10-26 – 2023-10-28 (×2): qty 30, 30d supply, fill #0
  Filled 2023-11-30: qty 30, 30d supply, fill #1
  Filled 2023-12-29: qty 30, 30d supply, fill #2

## 2023-10-27 ENCOUNTER — Other Ambulatory Visit (HOSPITAL_COMMUNITY): Payer: Self-pay

## 2023-10-27 ENCOUNTER — Other Ambulatory Visit: Payer: Self-pay | Admitting: Neurosurgery

## 2023-10-27 DIAGNOSIS — M549 Dorsalgia, unspecified: Secondary | ICD-10-CM | POA: Diagnosis not present

## 2023-10-27 DIAGNOSIS — Z79899 Other long term (current) drug therapy: Secondary | ICD-10-CM | POA: Diagnosis not present

## 2023-10-27 DIAGNOSIS — M2578 Osteophyte, vertebrae: Secondary | ICD-10-CM | POA: Diagnosis not present

## 2023-10-27 DIAGNOSIS — M5030 Other cervical disc degeneration, unspecified cervical region: Secondary | ICD-10-CM | POA: Diagnosis not present

## 2023-10-27 DIAGNOSIS — I10 Essential (primary) hypertension: Secondary | ICD-10-CM | POA: Diagnosis not present

## 2023-10-27 DIAGNOSIS — G8929 Other chronic pain: Secondary | ICD-10-CM | POA: Diagnosis not present

## 2023-10-27 DIAGNOSIS — M545 Low back pain, unspecified: Secondary | ICD-10-CM | POA: Diagnosis not present

## 2023-10-27 MED ORDER — OXYCODONE-ACETAMINOPHEN 5-325 MG PO TABS
1.0000 | ORAL_TABLET | Freq: Every day | ORAL | 0 refills | Status: DC | PRN
  Filled 2023-11-06: qty 150, 30d supply, fill #0

## 2023-10-28 ENCOUNTER — Other Ambulatory Visit (HOSPITAL_COMMUNITY): Payer: Self-pay

## 2023-10-28 ENCOUNTER — Encounter (HOSPITAL_COMMUNITY): Payer: Self-pay

## 2023-10-28 MED ORDER — LEVOTHYROXINE SODIUM 100 MCG PO TABS
100.0000 ug | ORAL_TABLET | Freq: Every morning | ORAL | 1 refills | Status: AC
  Filled 2023-11-19: qty 90, 90d supply, fill #0
  Filled 2024-02-17 – 2024-02-19 (×2): qty 90, 90d supply, fill #1
  Filled 2024-03-17: qty 90, 90d supply, fill #0

## 2023-10-29 ENCOUNTER — Other Ambulatory Visit (HOSPITAL_COMMUNITY): Payer: Self-pay

## 2023-11-02 ENCOUNTER — Inpatient Hospital Stay (HOSPITAL_COMMUNITY): Admit: 2023-11-02 | Admitting: Neurosurgery

## 2023-11-02 ENCOUNTER — Other Ambulatory Visit (HOSPITAL_COMMUNITY): Payer: Self-pay

## 2023-11-02 DIAGNOSIS — M7062 Trochanteric bursitis, left hip: Secondary | ICD-10-CM | POA: Diagnosis not present

## 2023-11-02 DIAGNOSIS — M19041 Primary osteoarthritis, right hand: Secondary | ICD-10-CM | POA: Diagnosis not present

## 2023-11-02 SURGERY — ANTERIOR CERVICAL DECOMPRESSION/DISCECTOMY FUSION 3 LEVELS
Anesthesia: General

## 2023-11-03 ENCOUNTER — Other Ambulatory Visit (HOSPITAL_COMMUNITY): Payer: Self-pay

## 2023-11-03 DIAGNOSIS — Z79899 Other long term (current) drug therapy: Secondary | ICD-10-CM | POA: Diagnosis not present

## 2023-11-04 ENCOUNTER — Other Ambulatory Visit (HOSPITAL_COMMUNITY): Payer: Self-pay

## 2023-11-06 ENCOUNTER — Other Ambulatory Visit (HOSPITAL_COMMUNITY): Payer: Self-pay

## 2023-11-06 MED ORDER — HYDRALAZINE HCL 50 MG PO TABS
50.0000 mg | ORAL_TABLET | Freq: Three times a day (TID) | ORAL | 3 refills | Status: DC
  Filled 2023-11-06: qty 270, 90d supply, fill #0

## 2023-11-06 NOTE — Addendum Note (Signed)
 Addended by: CHAUVIGNE, Donelle Baba on: 11/06/2023 12:59 PM   Modules accepted: Orders

## 2023-11-10 ENCOUNTER — Other Ambulatory Visit (HOSPITAL_COMMUNITY): Payer: Self-pay

## 2023-11-10 MED ORDER — ISOSORBIDE DINITRATE 30 MG PO TABS
30.0000 mg | ORAL_TABLET | Freq: Three times a day (TID) | ORAL | 2 refills | Status: DC
  Filled 2023-11-10: qty 90, 30d supply, fill #0
  Filled 2023-12-03: qty 90, 30d supply, fill #1

## 2023-11-10 NOTE — Telephone Encounter (Signed)
 ICD-10-CM   1. Primary hypertension  I10 isosorbide  dinitrate (ISORDIL ) 30 MG tablet    DISCONTINUED: hydrALAZINE  (APRESOLINE ) 25 MG tablet    2. Coronary artery disease of native artery of native heart with stable angina pectoris  I25.118 isosorbide  dinitrate (ISORDIL ) 30 MG tablet     No orders of the defined types were placed in this encounter.   Meds ordered this encounter  Medications   DISCONTD: hydrALAZINE  (APRESOLINE ) 25 MG tablet    Sig: Take 1 to 2 tablets as needed for SBP >150 mmHg    Dispense:  90 tablet    Refill:  1   hydrALAZINE  (APRESOLINE ) 50 MG tablet    Sig: Take 1 tablet (50 mg total) by mouth 3 (three) times daily.    Dispense:  270 tablet    Refill:  3   isosorbide  dinitrate (ISORDIL ) 30 MG tablet    Sig: Take 1 tablet (30 mg total) by mouth 3 (three) times daily.    Dispense:  90 tablet    Refill:  2

## 2023-11-10 NOTE — Addendum Note (Signed)
 Addended by: LADONA MILAN on: 11/10/2023 09:11 AM   Modules accepted: Orders

## 2023-11-11 ENCOUNTER — Other Ambulatory Visit (HOSPITAL_COMMUNITY): Payer: Self-pay

## 2023-11-12 NOTE — Telephone Encounter (Signed)
 Orders Placed This Encounter  Procedures   Magnesium   Potassium

## 2023-11-12 NOTE — Addendum Note (Signed)
 Addended by: LADONA MILAN on: 11/12/2023 05:55 PM   Modules accepted: Orders

## 2023-11-13 DIAGNOSIS — I10 Essential (primary) hypertension: Secondary | ICD-10-CM | POA: Diagnosis not present

## 2023-11-14 LAB — POTASSIUM: Potassium: 4.4 mmol/L (ref 3.5–5.2)

## 2023-11-14 LAB — MAGNESIUM: Magnesium: 2.4 mg/dL — ABNORMAL HIGH (ref 1.6–2.3)

## 2023-11-15 ENCOUNTER — Ambulatory Visit: Payer: Self-pay | Admitting: Cardiology

## 2023-11-15 NOTE — Progress Notes (Signed)
 Normal Potassium and Mg levels. Leg cramps unrelated to this

## 2023-11-16 DIAGNOSIS — M25561 Pain in right knee: Secondary | ICD-10-CM | POA: Diagnosis not present

## 2023-11-19 ENCOUNTER — Other Ambulatory Visit (HOSPITAL_COMMUNITY): Payer: Self-pay

## 2023-11-19 DIAGNOSIS — F4323 Adjustment disorder with mixed anxiety and depressed mood: Secondary | ICD-10-CM | POA: Diagnosis not present

## 2023-11-24 ENCOUNTER — Other Ambulatory Visit (HOSPITAL_COMMUNITY): Payer: Self-pay

## 2023-11-24 DIAGNOSIS — M545 Low back pain, unspecified: Secondary | ICD-10-CM | POA: Diagnosis not present

## 2023-11-24 DIAGNOSIS — M2578 Osteophyte, vertebrae: Secondary | ICD-10-CM | POA: Diagnosis not present

## 2023-11-24 DIAGNOSIS — M5030 Other cervical disc degeneration, unspecified cervical region: Secondary | ICD-10-CM | POA: Diagnosis not present

## 2023-11-24 DIAGNOSIS — R030 Elevated blood-pressure reading, without diagnosis of hypertension: Secondary | ICD-10-CM | POA: Diagnosis not present

## 2023-11-24 DIAGNOSIS — I10 Essential (primary) hypertension: Secondary | ICD-10-CM | POA: Diagnosis not present

## 2023-11-24 DIAGNOSIS — Z79899 Other long term (current) drug therapy: Secondary | ICD-10-CM | POA: Diagnosis not present

## 2023-11-24 MED ORDER — OXYCODONE-ACETAMINOPHEN 5-325 MG PO TABS
1.0000 | ORAL_TABLET | Freq: Every day | ORAL | 0 refills | Status: AC | PRN
  Filled 2023-12-04: qty 150, 30d supply, fill #0

## 2023-11-27 DIAGNOSIS — Z79899 Other long term (current) drug therapy: Secondary | ICD-10-CM | POA: Diagnosis not present

## 2023-11-30 ENCOUNTER — Other Ambulatory Visit (HOSPITAL_COMMUNITY): Payer: Self-pay

## 2023-11-30 ENCOUNTER — Encounter (HOSPITAL_COMMUNITY): Payer: Self-pay

## 2023-12-01 ENCOUNTER — Encounter: Payer: Self-pay | Admitting: Cardiology

## 2023-12-01 ENCOUNTER — Other Ambulatory Visit (HOSPITAL_COMMUNITY): Payer: Self-pay

## 2023-12-01 ENCOUNTER — Encounter: Payer: Self-pay | Admitting: Internal Medicine

## 2023-12-02 ENCOUNTER — Ambulatory Visit: Payer: Self-pay | Admitting: Internal Medicine

## 2023-12-02 DIAGNOSIS — E039 Hypothyroidism, unspecified: Secondary | ICD-10-CM | POA: Diagnosis not present

## 2023-12-02 DIAGNOSIS — E1169 Type 2 diabetes mellitus with other specified complication: Secondary | ICD-10-CM | POA: Diagnosis not present

## 2023-12-02 DIAGNOSIS — I10 Essential (primary) hypertension: Secondary | ICD-10-CM | POA: Diagnosis not present

## 2023-12-02 DIAGNOSIS — I2510 Atherosclerotic heart disease of native coronary artery without angina pectoris: Secondary | ICD-10-CM | POA: Diagnosis not present

## 2023-12-02 DIAGNOSIS — E782 Mixed hyperlipidemia: Secondary | ICD-10-CM | POA: Diagnosis not present

## 2023-12-02 DIAGNOSIS — F4323 Adjustment disorder with mixed anxiety and depressed mood: Secondary | ICD-10-CM | POA: Diagnosis not present

## 2023-12-02 NOTE — Progress Notes (Signed)
 BNP slightly high. Please talk to [facility] tomorrow . Could be mild stiffness of heart muscle. Alpha 1 genetic tes for copd is normal

## 2023-12-03 ENCOUNTER — Encounter: Payer: Self-pay | Admitting: Adult Health

## 2023-12-03 ENCOUNTER — Ambulatory Visit

## 2023-12-03 ENCOUNTER — Other Ambulatory Visit: Payer: Self-pay

## 2023-12-03 ENCOUNTER — Ambulatory Visit: Admitting: Adult Health

## 2023-12-03 VITALS — BP 126/66 | HR 71 | Temp 97.7°F | Ht 64.0 in | Wt 143.6 lb

## 2023-12-03 DIAGNOSIS — J439 Emphysema, unspecified: Secondary | ICD-10-CM

## 2023-12-03 DIAGNOSIS — R7989 Other specified abnormal findings of blood chemistry: Secondary | ICD-10-CM

## 2023-12-03 DIAGNOSIS — Z87891 Personal history of nicotine dependence: Secondary | ICD-10-CM

## 2023-12-03 DIAGNOSIS — I2510 Atherosclerotic heart disease of native coronary artery without angina pectoris: Secondary | ICD-10-CM

## 2023-12-03 DIAGNOSIS — I10 Essential (primary) hypertension: Secondary | ICD-10-CM

## 2023-12-03 DIAGNOSIS — R0609 Other forms of dyspnea: Secondary | ICD-10-CM

## 2023-12-03 DIAGNOSIS — Z889 Allergy status to unspecified drugs, medicaments and biological substances status: Secondary | ICD-10-CM

## 2023-12-03 DIAGNOSIS — Z01811 Encounter for preprocedural respiratory examination: Secondary | ICD-10-CM

## 2023-12-03 DIAGNOSIS — Z122 Encounter for screening for malignant neoplasm of respiratory organs: Secondary | ICD-10-CM

## 2023-12-03 DIAGNOSIS — R0989 Other specified symptoms and signs involving the circulatory and respiratory systems: Secondary | ICD-10-CM

## 2023-12-03 LAB — PULMONARY FUNCTION TEST
DL/VA % pred: 110 %
DL/VA: 4.56 ml/min/mmHg/L
DLCO cor % pred: 91 %
DLCO cor: 16.71 ml/min/mmHg
DLCO unc % pred: 92 %
DLCO unc: 16.96 ml/min/mmHg
FEF 25-75 Post: 2.19 L/s
FEF 25-75 Pre: 1.01 L/s
FEF2575-%Change-Post: 117 %
FEF2575-%Pred-Post: 145 %
FEF2575-%Pred-Pre: 67 %
FEV1-%Change-Post: 11 %
FEV1-%Pred-Post: 98 %
FEV1-%Pred-Pre: 88 %
FEV1-Post: 1.93 L
FEV1-Pre: 1.73 L
FEV1FVC-%Change-Post: 6 %
FEV1FVC-%Pred-Pre: 98 %
FEV6-%Change-Post: 7 %
FEV6-%Pred-Post: 98 %
FEV6-%Pred-Pre: 91 %
FEV6-Post: 2.43 L
FEV6-Pre: 2.27 L
FEV6FVC-%Change-Post: 1 %
FEV6FVC-%Pred-Post: 104 %
FEV6FVC-%Pred-Pre: 102 %
FVC-%Change-Post: 5 %
FVC-%Pred-Post: 94 %
FVC-%Pred-Pre: 89 %
FVC-Post: 2.47 L
FVC-Pre: 2.34 L
Post FEV1/FVC ratio: 78 %
Post FEV6/FVC ratio: 99 %
Pre FEV1/FVC ratio: 74 %
Pre FEV6/FVC Ratio: 97 %
RV % pred: 102 %
RV: 2.34 L
TLC % pred: 96 %
TLC: 4.72 L

## 2023-12-03 NOTE — Patient Instructions (Signed)
 Full pft performed today

## 2023-12-03 NOTE — Telephone Encounter (Signed)
 Pt has an appt with Tammy today will discuss at visit

## 2023-12-03 NOTE — Progress Notes (Signed)
 Full pft performed today

## 2023-12-03 NOTE — Progress Notes (Signed)
 @Patient  ID: Amanda Kim, female    DOB: 1946-02-08, 78 y.o.   MRN: [id number]  Chief Complaint  Patient presents with   Follow-up   Shortness of Breath    Referring provider: Geronimo Amel, MD  HPI: 78 year old female former smoker seen for pulmonary consult October 08, 2023 for shortness of breath and emphysema Medical history significant for coronary artery disease/NSTEMI 2024    TEST/EVENTS :  CT chest low-dose April 16, 2023 emphysema, smoking-related respiratory bronchiolitis, scattered pulmonary nodules measuring 4.2 mm or less, lung RADS 2, Alpha-1 MM, 146 Pulmonary function testing December 03, 2023 showed normal lung function with FEV1 at 98%, ratio 78, FVC 94%, mild bronchodilator response, mid flow reversibility, DLCO 92%  Discussed the use of AI scribe software for clinical note transcription with the patient, who gave verbal consent to proceed.  History of Present Illness Amanda Kim is a 78 year old female with emphysema who presents for a 2 months checkup.  She has a history of smoking and has been having shortness of breath with activities.   initially experiencing breathlessness when walking up a hill, which has progressed to breathlessness when walking across a room without exertion.  Patient participates in the lung cancer CT screening program that showed underlying emphysema.  She was seen last visit and started on Spiriva .  Lab work for alpha-1 antitrypsin deficiency was normal.   CT scan in February showed stable emphysema and unchanged lung nodules. She is currently on Spiriva , which has improved her breathing.  She does feel that her shortness of breath has improved.  Labs last visit did show slightly elevated BNP 166.  2D echo February 07, 2023 showed preserved EF, right ventricular systolic function normal and RV size normal.  No valvular disease noted.  She quit smoking earlier this year after experiencing a heart attack. She has a  history of coronary artery disease with a previous stent placement. She was hospitalized in August while in [place]  due to uncontrolled blood pressure, which was over 200, and experienced a restless night.  Care everywhere October 19, 2023 Hospital records were reviewed in detail.  Patient's troponin level was slightly elevated during hospitalization felt secondary to severe hypertension.  CT chest was negative for PE and no acute process. Patient has not set up a follow-up with cardiology.  She is scheduled for surgery next Friday for pinched nerves in her neck, which have caused headaches throughout the year. She has undergone several physical therapy sessions and has bone spurs and arthritis. She also experiences sciatica in her left leg and has chronic back pain managed with a spinal cord stimulator since an accident in 1987. She has received treatment for bursitis and a bone spur in her thumb.  She is looking forward to upcoming surgery to help with her neck pain.  She remains fully independent.  He is not on oxygen .  She is active with ADLs neck pain has been limiting her exercise abilities due to pain.     Allergies  Allergen Reactions   Sulfisoxazole Itching   Bee Venom Itching, Swelling and Rash   Penicillins Itching and Swelling    Immunization History  Administered Date(s) Administered   Influenza Whole 10/19/2011   Influenza,inj,Quad PF,6+ Mos 11/04/2013   Influenza-Unspecified 11/17/2012   PFIZER(Purple Top)SARS-COV-2 Vaccination 03/28/2019, 04/22/2019, 10/18/2019   Pneumococcal Conjugate-13 04/10/2017   Pneumococcal Polysaccharide-23 06/18/2011    Past Medical History:  Diagnosis Date   Back pain    d/t horse  fell on pt in the 80's   Bone spur    in neck(since 1990) and 1st 3 fingers on right hand go numb   Chronic pain    d/t horse accident in the 80's   Headache(784.0)    tension   History of colon polyps    Hyperlipidemia    takes Fish Oil and CoQ10    Hypertension    takes Inderal  daily   Hypothyroid 05/07/2011   takes Synthroid  daily   Leg cramps    takes Potassium prn   Liver disease    nonalcholic fatty liver disease   Nausea & vomiting    phenergan  and zofran  prn   Spondylolisthesis    Spondylosis of thoracolumbar region without myelopathy or radiculopathy    spine   Urinary frequency     Tobacco History: Social History   Tobacco Use  Smoking Status Former   Current packs/day: 0.80   Average packs/day: 0.8 packs/day for 45.0 years (36.0 ttl pk-yrs)   Types: Cigarettes  Smokeless Tobacco Never  Tobacco Comments   Using quitline [place] for cessation counseling, down to 6 cigarettes/day.    Counseling given: Not Answered Tobacco comments: Using quitline [place] for cessation counseling, down to 6 cigarettes/day.    Outpatient Medications Prior to Visit  Medication Sig Dispense Refill   acetaminophen  (TYLENOL ) 500 MG tablet Take 500-1,000 mg by mouth every 6 (six) hours as needed (pain.).     amLODipine  (NORVASC ) 10 MG tablet Take 1 tablet (10 mg total) by mouth daily. (Patient not taking: Reported on 12/03/2023) 90 tablet 3   aspirin  EC 81 MG tablet Take 1 tablet (81 mg total) by mouth daily. Swallow whole. 30 tablet 11   diclofenac  Sodium (VOLTAREN  ARTHRITIS PAIN) 1 % GEL Apply 1 Application topically 4 (four) times daily as needed (pain.).     DULoxetine  (CYMBALTA ) 60 MG capsule Take 1 capsule (60 mg total) by mouth daily. 90 capsule 1   Estradiol  (VAGIFEM ) 10 MCG TABS vaginal tablet Place 10 mcg vaginally 2 (two) times a week.     hydrALAZINE  (APRESOLINE ) 50 MG tablet Take 1 tablet (50 mg total) by mouth 3 (three) times daily. 270 tablet 3   hydrOXYzine  (ATARAX ) 25 MG tablet Take 25 mg by mouth 3 (three) times daily as needed for anxiety (or sleep).     isosorbide  dinitrate (ISORDIL ) 30 MG tablet Take 1 tablet (30 mg total) by mouth 3 (three) times daily. 90 tablet 2   levothyroxine  (SYNTHROID ) 100 MCG tablet Take 1 tablet  (100 mcg total) by mouth in the morning on an empty stomach. 90 tablet 3   levothyroxine  (SYNTHROID ) 100 MCG tablet Take 1 tablet (100 mcg total) by mouth every morning on an empty stomach. (Patient not taking: Reported on 12/03/2023) 90 tablet 1   lidocaine  (LIDODERM ) 5 % Apply 1 patch to skin per package directions, leave on most painful area for up to 12 hours. Max 1 patch daily (Patient taking differently: Place 1 patch onto the skin daily as needed (pain.).) 10 patch 0   losartan  (COZAAR ) 100 MG tablet Take 1 tablet (100 mg total) by mouth daily. 30 tablet 2   metoprolol  succinate (TOPROL  XL) 25 MG 24 hr tablet Take 1 tablet (25 mg total) by mouth daily. 90 tablet 3   nitroGLYCERIN  (NITROSTAT ) 0.4 MG SL tablet Place 1 tablet (0.4 mg total) under the tongue every 5 (five) minutes as needed for chest pain. 25 tablet 3   OVER THE COUNTER MEDICATION  Take 1 Dose by mouth at bedtime. Sound Asleep Gummy     oxyCODONE -acetaminophen  (PERCOCET/ROXICET) 5-325 MG tablet Take 1 tablet by mouth 5 (five) times daily as needed for pain. (Patient taking differently: Take 1 tablet by mouth in the morning, at noon, and at bedtime.) 150 tablet 0   prasugrel  (EFFIENT ) 10 MG TABS tablet Take 1 tablet (10 mg total) by mouth daily. 90 tablet 3   rosuvastatin  (CRESTOR ) 20 MG tablet Take 1 tablet (20 mg total) by mouth daily. 90 tablet 3   Tiotropium Bromide  Monohydrate (SPIRIVA  RESPIMAT) 1.25 MCG/ACT AERS Inhale 2 puffs into the lungs daily. 4 g 6   valACYclovir  (VALTREX ) 1000 MG tablet Take 1,000 mg by mouth 2 (two) times daily as needed ([place]).  2   Vitamin D -Vitamin K (D3 + K2 PO) Take 1 tablet by mouth daily.     No facility-administered medications prior to visit.     Review of Systems:   Constitutional:   No  weight loss, night sweats,  Fevers, chills, +fatigue, or  lassitude.  HEENT:   No headaches,  Difficulty swallowing,  Tooth/dental problems, or  Sore throat,                No sneezing, itching, ear  ache, nasal congestion, post nasal drip,   CV:  No chest pain,  Orthopnea, PND, swelling in lower extremities, anasarca, dizziness, palpitations, syncope.   GI  No heartburn, indigestion, abdominal pain, nausea, vomiting, diarrhea, change in bowel habits, loss of appetite, bloody stools.   Resp:  No chest wall deformity  Skin: no rash or lesions.  GU: no dysuria, change in color of urine, no urgency or frequency.  No flank pain, no hematuria   MS:  neck and back pain      Physical Exam  BP 126/66   Pulse 71   Temp 97.7 F (36.5 C) (Temporal)   Ht 5' 4 (1.626 m)   Wt 143 lb 9.6 oz (65.1 kg)   SpO2 96%   BMI 24.65 kg/m   GEN: A/Ox3; pleasant , NAD, well nourished    HEENT:  /AT,   NECK:  Supple w/ fair ROM; no JVD; normal carotid impulses w/o bruits; no thyromegaly or nodules palpated; no lymphadenopathy.    RESP  Clear  P & A; w/o, wheezes/ rales/ or rhonchi. no accessory muscle use, no dullness to percussion  CARD:  RRR, no m/r/g, no peripheral edema, pulses intact, no cyanosis or clubbing.  GI:   Soft & nt; nml bowel sounds; no organomegaly or masses detected.   Musco: Warm bil, no deformities or joint swelling noted.   Neuro: alert, no focal deficits noted.    Skin: Warm, no lesions or rashes    Lab Results:  CBC    Component Value Date/Time   WBC 7.8 10/08/2023 1535   RBC 4.58 10/08/2023 1535   HGB 13.9 10/08/2023 1535   HGB 14.7 10/10/2022 1521   HCT 42.1 10/08/2023 1535   PLT 294.0 10/08/2023 1535   PLT 311 10/10/2022 1521   MCV 91.9 10/08/2023 1535   MCH 30.3 03/30/2023 1958   MCHC 33.1 10/08/2023 1535   RDW 13.3 10/08/2023 1535   LYMPHSABS 2.0 10/08/2023 1535   MONOABS 0.6 10/08/2023 1535   EOSABS 0.1 10/08/2023 1535   BASOSABS 0.0 10/08/2023 1535    BMET    Component Value Date/Time   NA 137 03/30/2023 1958   K 4.4 11/13/2023 1259   CL 103 03/30/2023 1958  CO2 21 (L) 03/30/2023 1958   GLUCOSE 128 (H) 03/30/2023 1958   BUN 14  03/30/2023 1958   CREATININE 0.70 03/30/2023 1958   CREATININE 0.73 10/10/2022 1521   CREATININE 0.70 05/14/2012 1651   CALCIUM  9.4 03/30/2023 1958   GFRNONAA >60 03/30/2023 1958   GFRNONAA >60 10/10/2022 1521   GFRAA >60 12/19/2015 1309    BNP    Component Value Date/Time   BNP 136.8 (H) 03/30/2023 2209    ProBNP    Component Value Date/Time   PROBNP 166.0 (H) 10/08/2023 1535    Imaging: No results found.  Administration History     None          Latest Ref Rng & Units 12/03/2023    1:51 PM  PFT Results  FVC-Pre L 2.34  P  FVC-Predicted Pre % 89  P  FVC-Post L 2.47  P  FVC-Predicted Post % 94  P  Pre FEV1/FVC % % 74  P  Post FEV1/FCV % % 78  P  FEV1-Pre L 1.73  P  FEV1-Predicted Pre % 88  P  FEV1-Post L 1.93  P  DLCO uncorrected ml/min/mmHg 16.96  P  DLCO UNC% % 92  P  DLCO corrected ml/min/mmHg 16.71  P  DLCO COR %Predicted % 91  P  DLVA Predicted % 110  P  TLC L 4.72  P  TLC % Predicted % 96  P  RV % Predicted % 102  P    P Preliminary result    No results found for: NITRICOXIDE      Assessment & Plan:   Assessment and Plan Assessment & Plan Emphysema  -symptom burden seems to be improved since starting on Spiriva .  Mild emphysema with stable lung function.  Despite significant smoking history patient has no significant airflow obstruction or restriction.  Has some small airways disease and significant small airways reversibility.  Diffusing capacity remains normal.  PFTs were reviewed in detail with patient. show small airway obstruction improved with bronchodilator. February CT scan showed unchanged lung nodules. Alpha-1 antitrypsin deficiency test was normal. She experiences mild dyspnea on exertion, but overall lung function is good. Smoking cessation has positively impacted her lung health. Continue Spiriva  for bronchodilation. Encourage participation in annual lung cancer screening. Advise remaining active to maintain lung function.  Recommend influenza and COVID-19 vaccinations.  Coronary artery disease, status post myocardial infarction and stent  -recent hospitalization for uncontrolled hypertension She had a myocardial infarction and stent placement with recent hospitalization for uncontrolled hypertension. BNP is minimally elevated from previous  but there are no symptoms of congestive heart failure. Troponin levels were minimally elevated during hospitalization, not diagnostic of myocardial infarction. Blood pressure management is crucial to prevent further cardiac events. Follow up with a cardiologist for management of coronary artery disease and elevated BNP. Ensure blood pressure is well controlled with current medications. recent hospitalization and test results reviewed in detail.  Hypertension  -currently well-controlled today. Previously uncontrolled hypertension led to hospitalization recently. Currently managed with medication adjustments, resulting in improved control. Blood pressure is higher in the morning and lowers after taking medication in the evening. Continue current antihypertensive regimen as prescribed. Monitor blood pressure regularly and report any significant changes to the cardiologist.  Follow-up with cardiology as as discussed  Cervical radiculopathy  /pulmonary preop risk assessment Chronic cervical radiculopathy with pinched nerves and associated headache. Scheduled for surgical intervention Anesthesia risks, including potential for increased risk of pneumonia postoperatively and cognitive effects, especially in older  adults, were discussed.  Discuss anesthesia risks and postoperative mobility to prevent complications.  From a pulmonary standpoint patient would be considered mild to moderate risk as she is fully independent not on oxygen  and pulmonary function testing shows no significant airflow obstruction or restriction.  Major Pulmonary risks identified in the multifactorial risk analysis are  but not limited to a) pneumonia; b) recurrent intubation risk; c) prolonged or recurrent acute respiratory failure needing mechanical ventilation; d) prolonged hospitalization; e) DVT/Pulmonary embolism; f) Acute Pulmonary edema  Recommend 1. Short duration of surgery as much as possible and avoid paralytic if possible . Recovery in step down or ICU with Pulmonary consultation if indicated . Aggressive pulmonary toilet with o2, bronchodilatation, and incentive spirometry and early ambulation     Plan  Patient Instructions  Continue on Spiriva  2 puffs daily  Albuterol inhaler As needed   Activity as tolerated  Follow up with Cardiology as discussed.  Continue with yearly CT chest screening Follow up with Dr. Geronimo in 6 months and As needed         Madelin Stank, NP 12/03/2023

## 2023-12-03 NOTE — Patient Instructions (Addendum)
 Continue on Spiriva  2 puffs daily  Albuterol inhaler As needed   Activity as tolerated  Follow up with Cardiology as discussed.  Continue with yearly CT chest screening Follow up with Dr. Geronimo in 6 months and As needed

## 2023-12-03 NOTE — Progress Notes (Addendum)
 Surgical Instructions   Your procedure is scheduled on Friday, October 24th. Report to [facility] Main Entrance A at 5:30 A.M., then check in with the Admitting office. Any questions or running late day of surgery: call [phone]  Questions prior to your surgery date: call [phone], Monday-Friday, 8am-4pm. If you experience any cold or flu symptoms such as cough, fever, chills, shortness of breath, etc. between now and your scheduled surgery, please notify us  at the above number.     Remember:  Do not eat or drink after midnight the night before your surgery. This includes no gum, mints, or hard candy.   Take these medicines the morning of surgery with A SIP OF WATER  hydrALAZINE  (APRESOLINE )  isosorbide  dinitrate (ISORDIL )  levothyroxine  (SYNTHROID )  Tiotropium Bromide  Monohydrate (SPIRIVA  RESPIMAT) inhaler    May take these medicines IF NEEDED: acetaminophen  (TYLENOL )  hydrOXYzine  (ATARAX )  nitroGLYCERIN  (NITROSTAT )  - please call [phone] after taking this medication oxyCODONE -acetaminophen  (PERCOCET/ROXICET)  valACYclovir  (VALTREX )    Follow your surgeon's instructions on when to stop Asprin and prasugrel  (EFFIENT ) .  If no instructions were given by your surgeon then you will need to call the office to get those instructions.    One week prior to surgery, STOP taking any Aspirin  (unless otherwise instructed by your surgeon) Aleve, Naproxen, Ibuprofen , Motrin , Advil , Goody's, BC's, all herbal medications, fish oil, and non-prescription vitamins. This includes your diclofenac  Sodium (VOLTAREN ) gel.                      Do NOT Smoke (Tobacco/Vaping) for 24 hours prior to your procedure.  If you use a CPAP at night, you may bring your mask/headgear for your overnight stay.   You will be asked to remove any contacts, glasses, piercing's, hearing aid's, dentures/partials prior to surgery. Please bring cases for these items if needed.    Patients discharged the day  of surgery will not be allowed to drive home, and someone needs to stay with them for 24 hours.  SURGICAL WAITING ROOM VISITATION Patients may have no more than 2 support people in the waiting area - these visitors may rotate.   Pre-op nurse will coordinate an appropriate time for 1 ADULT support person, who may not rotate, to accompany patient in pre-op.  Children under the age of 64 must have an adult with them who is not the patient and must remain in the main waiting area with an adult.  If the patient needs to stay at the hospital during part of their recovery, the visitor guidelines for inpatient rooms apply.  Please refer to the [facility] website for the visitor guidelines for any additional information.   If you received a COVID test during your pre-op visit  it is requested that you wear a mask when out in public, stay away from anyone that may not be feeling well and notify your surgeon if you develop symptoms. If you have been in contact with anyone that has tested positive in the last 10 days please notify you surgeon.      Pre-operative 4 CHG Bathing Instructions   You can play a key role in reducing the risk of infection after surgery. Your skin needs to be as free of germs as possible. You can reduce the number of germs on your skin by washing with CHG (chlorhexidine  gluconate) soap before surgery. CHG is an antiseptic soap that kills germs and continues to kill germs even after washing.   DO NOT  use if you have an allergy to chlorhexidine /CHG or antibacterial soaps. If your skin becomes reddened or irritated, stop using the CHG and notify one of our RNs at [phone].   Please shower with the CHG soap starting 4 days before surgery using the following schedule:     Please keep in mind the following:  DO NOT shave, including legs and underarms, starting the day of your first shower.   Place clean sheets on your bed the day you start using CHG soap. Use a clean  washcloth (not used since being washed) for each shower. DO NOT sleep with pets once you start using the CHG.   CHG Shower Instructions:  Wash your face and private area with normal soap. If you choose to wash your hair, wash first with your normal shampoo.  After you use shampoo/soap, rinse your hair and body thoroughly to remove shampoo/soap residue.  Turn the water OFF and apply  bottle of CHG soap to a CLEAN washcloth.  Apply CHG soap ONLY FROM YOUR NECK DOWN TO YOUR TOES (washing for 3-5 minutes)  DO NOT use CHG soap on face, private areas, open wounds, or sores.  Pay special attention to the area where your surgery is being performed.  If you are having back surgery, having someone wash your back for you may be helpful. Wait 2 minutes after CHG soap is applied, then you may rinse off the CHG soap.  Pat dry with a clean towel  Put on clean clothes/pajamas   If you choose to wear lotion, please use ONLY the CHG-compatible lotions that are listed below.  Additional instructions for the day of surgery:  If you choose, you may shower the morning of surgery with an antibacterial soap.  DO NOT APPLY any lotions, deodorants, powders, or perfumes.   Do not bring valuables to the hospital. [facility] is not responsible for any belongings/valuables. Do not wear nail polish, gel polish, artificial nails, or any other type of covering on natural nails (fingers and toes) Do not wear jewelry or makeup Put on clean/comfortable clothes.  Please brush your teeth.  Ask your nurse before applying any prescription medications to the skin.     CHG Compatible Lotions   Aveeno Moisturizing lotion  Cetaphil Moisturizing Cream  Cetaphil Moisturizing Lotion  Clairol Herbal Essence Moisturizing Lotion, Dry Skin  Clairol Herbal Essence Moisturizing Lotion, Extra Dry Skin  Clairol Herbal Essence Moisturizing Lotion, Normal Skin  Curel Age Defying Therapeutic Moisturizing Lotion with Alpha Hydroxy   Curel Extreme Care Body Lotion  Curel Soothing Hands Moisturizing Hand Lotion  Curel Therapeutic Moisturizing Cream, Fragrance-Free  Curel Therapeutic Moisturizing Lotion, Fragrance-Free  Curel Therapeutic Moisturizing Lotion, Original Formula  Eucerin Daily Replenishing Lotion  Eucerin Dry Skin Therapy Plus Alpha Hydroxy Crme  Eucerin Dry Skin Therapy Plus Alpha Hydroxy Lotion  Eucerin Original Crme  Eucerin Original Lotion  Eucerin Plus Crme Eucerin Plus Lotion  Eucerin TriLipid Replenishing Lotion  Keri Anti-Bacterial Hand Lotion  Keri Deep Conditioning Original Lotion Dry Skin Formula Softly Scented  Keri Deep Conditioning Original Lotion, Fragrance Free Sensitive Skin Formula  Keri Lotion Fast Absorbing Fragrance Free Sensitive Skin Formula  Keri Lotion Fast Absorbing Softly Scented Dry Skin Formula  Keri Original Lotion  Keri Skin Renewal Lotion Keri Silky Smooth Lotion  Keri Silky Smooth Sensitive Skin Lotion  Nivea Body Creamy Conditioning Oil  Nivea Body Extra Enriched Teacher, adult education Moisturizing Lotion Nivea Crme  Nivea Skin  Firming Lotion  NutraDerm 30 Skin Lotion  NutraDerm Skin Lotion  NutraDerm Therapeutic Skin Cream  NutraDerm Therapeutic Skin Lotion  ProShield Protective Hand Cream  Provon moisturizing lotion  Please read over the following fact sheets that you were given.

## 2023-12-04 ENCOUNTER — Encounter (HOSPITAL_COMMUNITY)
Admission: RE | Admit: 2023-12-04 | Discharge: 2023-12-04 | Disposition: A | Discharge status: Home / Self Care | Source: Ambulatory Visit | Attending: Neurosurgery | Admitting: Neurosurgery

## 2023-12-04 ENCOUNTER — Encounter (HOSPITAL_COMMUNITY): Payer: Self-pay

## 2023-12-04 ENCOUNTER — Other Ambulatory Visit: Payer: Self-pay

## 2023-12-04 ENCOUNTER — Other Ambulatory Visit (HOSPITAL_COMMUNITY): Payer: Self-pay

## 2023-12-04 VITALS — BP 107/68 | HR 76 | Temp 98.5°F | Resp 17 | Ht 63.0 in | Wt 143.2 lb

## 2023-12-04 DIAGNOSIS — I252 Old myocardial infarction: Secondary | ICD-10-CM | POA: Diagnosis not present

## 2023-12-04 DIAGNOSIS — Z87891 Personal history of nicotine dependence: Secondary | ICD-10-CM | POA: Insufficient documentation

## 2023-12-04 DIAGNOSIS — K7581 Nonalcoholic steatohepatitis (NASH): Secondary | ICD-10-CM | POA: Insufficient documentation

## 2023-12-04 DIAGNOSIS — G8929 Other chronic pain: Secondary | ICD-10-CM | POA: Diagnosis not present

## 2023-12-04 DIAGNOSIS — E785 Hyperlipidemia, unspecified: Secondary | ICD-10-CM | POA: Insufficient documentation

## 2023-12-04 DIAGNOSIS — E039 Hypothyroidism, unspecified: Secondary | ICD-10-CM | POA: Diagnosis not present

## 2023-12-04 DIAGNOSIS — J439 Emphysema, unspecified: Secondary | ICD-10-CM | POA: Diagnosis not present

## 2023-12-04 DIAGNOSIS — M542 Cervicalgia: Secondary | ICD-10-CM | POA: Diagnosis not present

## 2023-12-04 DIAGNOSIS — I2510 Atherosclerotic heart disease of native coronary artery without angina pectoris: Secondary | ICD-10-CM | POA: Diagnosis not present

## 2023-12-04 DIAGNOSIS — Z01812 Encounter for preprocedural laboratory examination: Secondary | ICD-10-CM | POA: Insufficient documentation

## 2023-12-04 DIAGNOSIS — Z01818 Encounter for other preprocedural examination: Secondary | ICD-10-CM

## 2023-12-04 HISTORY — DX: Acute myocardial infarction, unspecified: I21.9

## 2023-12-04 LAB — SURGICAL PCR SCREEN
MRSA, PCR: NEGATIVE
Staphylococcus aureus: NEGATIVE

## 2023-12-04 LAB — BASIC METABOLIC PANEL WITH GFR
Anion gap: 10 (ref 5–15)
BUN: 16 mg/dL (ref 8–23)
CO2: 26 mmol/L (ref 22–32)
Calcium: 9.3 mg/dL (ref 8.9–10.3)
Chloride: 105 mmol/L (ref 98–111)
Creatinine, Ser: 0.88 mg/dL (ref 0.44–1.00)
GFR, Estimated: >60 mL/min (ref >60)
Glucose, Bld: 118 mg/dL — ABNORMAL HIGH (ref 70–99)
Potassium: 3.8 mmol/L (ref 3.5–5.1)
Sodium: 141 mmol/L (ref 135–145)

## 2023-12-04 LAB — CBC
HCT: 39.7 % (ref 36.0–46.0)
Hemoglobin: 12.7 g/dL (ref 12.0–15.0)
MCH: 31 pg (ref 26.0–34.0)
MCHC: 32 g/dL (ref 30.0–36.0)
MCV: 96.8 fL (ref 80.0–100.0)
Platelets: 290 K/uL (ref 150–400)
RBC: 4.1 MIL/uL (ref 3.87–5.11)
RDW: 12.5 % (ref 11.5–15.5)
WBC: 7.5 K/uL (ref 4.0–10.5)
nRBC: 0 % (ref 0.0–0.2)

## 2023-12-04 LAB — TYPE AND SCREEN
ABO/RH(D): B NEG
Antibody Screen: NEGATIVE

## 2023-12-04 NOTE — Progress Notes (Signed)
 PCP - Dr. Lequita Flor Cardiologist - Dr. Gordy Bergamo - clearance on 09-11-23 Pulmonologist - Dr. Dorethia Cave - clearance on 12-03-23  PPM/ICD - Denies Device Orders - n/a Rep Notified - n/a  Spinal cord stimulator - 09/21/19 - patient instructed to bring remote with on DOS.  Chest x-ray - n/a EKG - 08-03-23 Stress Test - denies ECHO - 12-03-23 Cardiac Cath - 02-06-23  Sleep Study - Denies CPAP - n/a  NON-diabetic  Last dose of GLP1 agonist-  denies GLP1 instructions: n/a  Blood Thinner Instructions:prasugrel  (EFFIENT ) Hold for 5 days per Dr. Bergamo Aspirin  Instructions:aspirin  EC 81 hold for 1 week per Dr. Bergamo  ERAS Protcol - npo PRE-SURGERY Ensure or G2- none  COVID TEST- n/a   Anesthesia review: Yes, HTN, HLD, MI, spinal cord stimulator.  Patient denies shortness of breath, fever, cough and chest pain at PAT appointment.   All instructions explained to the patient, with a verbal understanding of the material. Patient agrees to go over the instructions while at home for a better understanding. Patient also instructed to self quarantine after being tested for COVID-19. The opportunity to ask questions was provided.

## 2023-12-07 ENCOUNTER — Other Ambulatory Visit: Payer: Self-pay

## 2023-12-07 ENCOUNTER — Encounter (HOSPITAL_COMMUNITY): Payer: Self-pay

## 2023-12-07 ENCOUNTER — Other Ambulatory Visit (HOSPITAL_COMMUNITY): Payer: Self-pay

## 2023-12-07 DIAGNOSIS — I10 Essential (primary) hypertension: Secondary | ICD-10-CM

## 2023-12-07 DIAGNOSIS — I25118 Atherosclerotic heart disease of native coronary artery with other forms of angina pectoris: Secondary | ICD-10-CM

## 2023-12-07 MED ORDER — ISOSORBIDE DINITRATE 30 MG PO TABS
30.0000 mg | ORAL_TABLET | Freq: Three times a day (TID) | ORAL | 2 refills | Status: AC
  Filled 2023-12-07 – 2024-01-04 (×2): qty 270, 90d supply, fill #0

## 2023-12-07 MED ORDER — HYDRALAZINE HCL 50 MG PO TABS
50.0000 mg | ORAL_TABLET | Freq: Three times a day (TID) | ORAL | 2 refills | Status: AC
  Filled 2023-12-07 – 2024-01-28 (×2): qty 270, 90d supply, fill #0

## 2023-12-07 NOTE — Progress Notes (Signed)
 Anesthesia Chart Review:  Case: [id number] Date/Time: 12/11/23 0700   Procedure: ANTERIOR CERVICAL DECOMPRESSION/DISCECTOMY FUSION 3 LEVELS - ACDF - C3-C4 - C4-C5 - C5-C6   Anesthesia type: General   Diagnosis: Cervicalgia [M54.2]   Pre-op diagnosis: Cervicalgia   [facility] ROOM 21 / [facility]   Surgeons: Onetha Kuba, MD       DISCUSSION: Patient is a 78 year old female scheduled for the above procedure.  History includes former smoker (quit ~ 01/2023), emphysema, pulmonary nodules (stable 04/16/2023 CT), CAD (NSTEMI, s/p DES ost-mid LAD & DES prox CX 02/06/2023), HLD, hypothyroidism, chronic pain (following horse accident 1980's), NASH, spinal surgery (spinal cord stimulator 09/2019), cholecystectomy (05/20/2012), skin cancer (s/p MOHS left ala)  Her CAD is followed by Dr. Ladona. S/p NSTEMI with DES to LAD and CX in 01/2023. She had ED visit on 10/18/2023 while visiting [place] . She had been out to eat and drink and did not take her BP medications. She later checked her BP which was in the 200's. She also developed some nausea and chest pressure which she felt was similar to her prior MI, so she presented to the hospital for further evaluation [facility]). She had mildly elevated, flat troponins (34-37-43-32-43) which was attributed to significantly elevated BP. D-dimer 0.64, so CTA chest was done which was negative for PE. EKG showed SR with PACs, age undetermined anterior infarct. Her BP improved in the ED, and she was discharged.  Patient did communicate recent ED visit with Dr. Ladona, and he reviewed the records. If not recurrent symptoms and BP better controlled, then he thought she could continue with her routine cardiology follow-up.  He had previously addressed timing of c-spine surgery given DES x2 in 01/2023. Per communication threads between patient and Dr. Ladona, he advised, As it has been >6 months and you have had severe pain in your neck, go ahead and schedule for surgery and I  will approve holding antiplatelet therapy now and clarified, Stop ASA 1 week and Effient  5 days, surgery of the 6-day and restart aspirin  at the earliest 81 mg daily.  She will hold Effient  5 days and ASA 81 mg for 1 week as she discussed with Dr. Ladona.  She had pulmonology visit with Orlie Milling, NP for emphysema follow-up and preoperative evaluation on 12/03/2023. PFTs showed small airway obstruction improved with bronchodilator [FVC 2.34 (89%), post 2.47 (94%); FEV1 1.73 (88%), post 1.93 (98%); FEV1/FVC 74% (98%), post 78% (104%).]  Lung nodules were stable by February chest CT.  Known mild DOE but overall good lung function.  Smoking cessation had positively impacted her lung health.  Continue Spiriva  for bronchodilation.  Continue annual lung cancer screening. In regards to surgery she wrote, From a pulmonary standpoint patient would be considered mild to moderate risk as she is fully independent not on oxygen  and pulmonary function testing shows no significant airflow obstruction or restriction.   Major Pulmonary risks identified in the multifactorial risk analysis are but not limited to a) pneumonia; b) recurrent intubation risk; c) prolonged or recurrent acute respiratory failure needing mechanical ventilation; d) prolonged hospitalization; e) DVT/Pulmonary embolism; f) Acute Pulmonary edema   Recommend 1. Short duration of surgery as much as possible and avoid paralytic if possible . Recovery in step down or ICU with Pulmonary consultation if indicated . Aggressive pulmonary toilet with o2, bronchodilatation, and incentive spirometry and early ambulation  Six month pulmonology follow-up recommended.  She has preoperative cardiology and pulmonology input. It has been > 6  months since DES (~ 10 months). BP was 107/68 at PAT. Anesthesia team to evaluate on the day of surgery.  She was advised to bring her spinal cord stimulator remote with her on the day of surgery.  She is for ABO lab  draw on arrival.   VS: BP 107/68   Pulse 76   Temp 36.9 C (Oral)   Resp 17   Ht 5' 3 (1.6 m)   Wt 65 kg   SpO2 98%   BMI 25.37 kg/m    PROVIDERS: Verdia Lombard, MD is PCP  Ladona Heinz, MD is cardiologist Geronimo Amel, MD is pulmonologist Tommas Pears, MD is endocrinologist   LABS: Labs reviewed: Acceptable for surgery. A1c 6.4%, AST 19, ALT 20, total bili 0.3, alk phos 59, TSH 0.402 on 12/03/2023 [facility]). (all labs ordered are listed, but only abnormal results are displayed)  Labs Reviewed  BASIC METABOLIC PANEL WITH GFR - Abnormal; Notable for the following components:      Result Value   Glucose, Bld 118 (*)    All other components within normal limits  SURGICAL PCR SCREEN  CBC  TYPE AND SCREEN    OTHER: PFTs 12/03/2023: Per review by Orlie Milling, NP Pulmonary function testing December 03, 2023 showed normal lung function with FEV1 at 98%, ratio 78, FVC 94%, mild bronchodilator response, mid flow reversibility, DLCO 92%  Baseline Polysomnogram 10/29/2017: IMPRESSION:  1. Much too Mild Obstructive Sleep Apnea (OSA) to call for any  intervention. There was prolonged hypoxemia noted in REM sleep.  2. Mild Snoring  3. Many Spontaneous Arousals- patient related these to pain.  4. Sleep onset Bruxism  5. Sinus rhythm EKG, not bradycardic.  RECOMMENDATIONS:..Best therapy for this constellation is treatment of anxiety  and stress with behavior modification and smoking cessation.SABRASABRAPain therapy for chronic pain is needed...   IMAGES: CTA Chest 10/19/2023 ([facility]): Impression:  No evidence of pulmonary embolus.   MRI C-spine 08/11/2023 ([facility]): IMPRESSION:  1. Multilevel cervical spondylosis results in up to moderate to advanced canal stenosis at C4-C5 and C5-C6, with moderate canal stenosis at C6-C7.  2.  Varying degrees of multilevel foraminal stenosis, moderate to advanced on the right at C3-C4 and advanced on the right at  C4-C5. Additional mild to moderate degrees of multilevel foraminal stenosis are detailed above.  3.  Degenerative disc changes flatten the cord at multiple levels. Within technical constraints imposed by patient motion, no definite cervical cord signal abnormality is identified.   CT Chest [facility] 04/16/2023: IMPRESSION: 1. Lung-RADS 2, benign appearance or behavior. Continue annual screening with low-dose chest CT without contrast in 12 months. 2. Aortic atherosclerosis (ICD10-I70.0). Coronary artery calcification. 3.  Emphysema (ICD10-J43.9).   CT C-spine 03/30/2023: IMPRESSION: 1. No acute or traumatic finding. Straightening of the normal cervical lordosis with mild kyphotic curvature. 2 mm of degenerative anterolisthesis at C3-4 and C7-T1. 2. C3-4: Facet arthropathy with 2 mm of degenerative anterolisthesis. Bilateral foraminal narrowing that could affect either C4 nerve. 3. C4-5: Spondylosis with endplate osteophytes. Mild bilateral foraminal narrowing. 4. C5-6: Spondylosis with endplate osteophytes. Foraminal narrowing worse on the left than the right. Neural compression could occur at this level, particularly on the left. 5. C6-7: Endplate and uncovertebral osteophytes. Bilateral foraminal narrowing that could affect either C7 nerve.  CT Head 03/30/2023: IMPRESSION: No acute CT finding. Age related volume loss. Mild chronic small-vessel ischemic change of the cerebral hemispheric white matter.    EKG:  EKG 10/18/2023 [facility]): Per Narrative  in [facility]: Sinus rhythm with premature atrial complexes  Anterior infarct , age undetermined  Abnormal ECG  No previous ECGs available  Confirmed by Carlette Rigg ([id number]) on 10/18/2023 10:54:23 AM   EKG 08/03/2023:  Normal sinus rhythm Anterior infarct (cited on or before 06-Feb-2023) When compared with ECG of 30-Mar-2023 19:53, Criteria for Inferior infarct are no longer Present ST elevation now present in Inferior  leads Confirmed by Madie Slough ([id number]) on 08/03/2023 1:42:38 PM   CV: Echo 02/07/2023: IMPRESSIONS   1. Left ventricular ejection fraction, by estimation, is 55 to 60%. The  left ventricle has normal function. The left ventricle has no regional  wall motion abnormalities. Left ventricular diastolic parameters are  indeterminate.   2. Right ventricular systolic function is normal. The right ventricular  size is normal.   3. The mitral valve is normal in structure. No evidence of mitral valve  regurgitation. No evidence of mitral stenosis.   4. The aortic valve was not well visualized. Aortic valve regurgitation  is not visualized. No aortic stenosis is present.   5. The inferior vena cava is normal in size with greater than 50%  respiratory variability, suggesting right atrial pressure of 3 mmHg.  - Comparison(s): No prior Echocardiogram.   Cardiac cath/PCI 02/06/2023: 1.  Patent left main  2.  Patent RCA with mild nonobstructive plaquing 3.  Severe diffuse proximal LAD stenosis treated successfully with intravascular ultrasound-guided stenting using a 3.0 x 32 mm Synergy DES, reducing 80% stenosis to 0% 4.  Severe 90% stenosis of the mid circumflex/first OM treated with a 3.0 x 16 mm Synergy DES 5.  Normal LVEDP   DAPT with aspirin  and ticagrelor  x 12 months.  Aggressive risk reduction, tobacco cessation.  Okay for discharge tomorrow as long as no complications arise.    Past Medical History:  Diagnosis Date   Back pain    d/t horse fell on pt in the 80's   Bone spur    in neck(since 1990) and 1st 3 fingers on right hand go numb   Chronic pain    d/t horse accident in the 80's   Headache(784.0)    tension   History of colon polyps    Hyperlipidemia    takes Fish Oil and CoQ10   Hypertension    takes Inderal  daily   Hypothyroid 05/07/2011   takes Synthroid  daily   Leg cramps    takes Potassium prn   Liver disease    nonalcholic fatty liver disease   Myocardial  infarction (HCC)    Nausea & vomiting    phenergan  and zofran  prn   Spondylolisthesis    Spondylosis of thoracolumbar region without myelopathy or radiculopathy    spine   Urinary frequency     Past Surgical History:  Procedure Laterality Date   CESAREAN SECTION  1967, 1970   CHOLECYSTECTOMY N/A 05/20/2012   Procedure: LAPAROSCOPIC CHOLECYSTECTOMY WITH INTRAOPERATIVE CHOLANGIOGRAM;  Surgeon: Deward GORMAN Curvin DOUGLAS, MD;  Location: [facility] OR;  Service: General;  Laterality: N/A;   COLONOSCOPY     CORONARY STENT INTERVENTION N/A 02/06/2023   Procedure: CORONARY STENT INTERVENTION;  Surgeon: Wonda Sharper, MD;  Location: [facility] INVASIVE CV LAB;  Service: Cardiovascular;  Laterality: N/A;   CORONARY ULTRASOUND/IVUS N/A 02/06/2023   Procedure: Coronary Ultrasound/IVUS;  Surgeon: Wonda Sharper, MD;  Location: [facility] INVASIVE CV LAB;  Service: Cardiovascular;  Laterality: N/A;   LEFT HEART CATH AND CORONARY ANGIOGRAPHY N/A 02/06/2023   Procedure: LEFT HEART CATH AND CORONARY ANGIOGRAPHY;  Surgeon: Wonda Sharper, MD;  Location: [facility] INVASIVE CV LAB;  Service: Cardiovascular;  Laterality: N/A;   TUBAL LIGATION  1981    MEDICATIONS:  acetaminophen  (TYLENOL ) 500 MG tablet   amLODipine  (NORVASC ) 10 MG tablet   aspirin  EC 81 MG tablet   diclofenac  Sodium (VOLTAREN  ARTHRITIS PAIN) 1 % GEL   DULoxetine  (CYMBALTA ) 60 MG capsule   Estradiol  (VAGIFEM ) 10 MCG TABS vaginal tablet   hydrALAZINE  (APRESOLINE ) 50 MG tablet   hydrOXYzine  (ATARAX ) 25 MG tablet   isosorbide  dinitrate (ISORDIL ) 30 MG tablet   levothyroxine  (SYNTHROID ) 100 MCG tablet   levothyroxine  (SYNTHROID ) 100 MCG tablet   lidocaine  (LIDODERM ) 5 %   losartan  (COZAAR ) 100 MG tablet   metoprolol  succinate (TOPROL  XL) 25 MG 24 hr tablet   nitroGLYCERIN  (NITROSTAT ) 0.4 MG SL tablet   OVER THE COUNTER MEDICATION   oxyCODONE -acetaminophen  (PERCOCET/ROXICET) 5-325 MG tablet   prasugrel  (EFFIENT ) 10 MG TABS tablet   rosuvastatin  (CRESTOR ) 20 MG tablet    Tiotropium Bromide  Monohydrate (SPIRIVA  RESPIMAT) 1.25 MCG/ACT AERS   valACYclovir  (VALTREX ) 1000 MG tablet   Vitamin D -Vitamin K (D3 + K2 PO)   No current facility-administered medications for this encounter.    Isaiah Ruder, PA-C Surgical Short Stay/Anesthesiology [facility] Phone [phone] [facility] Phone [phone] 12/07/2023 5:28 PM

## 2023-12-07 NOTE — Anesthesia Preprocedure Evaluation (Addendum)
 Anesthesia Evaluation  Patient identified by MRN, date of birth, ID band Patient awake    Reviewed: Allergy & Precautions, NPO status , Patient's Chart, lab work & pertinent test results  History of Anesthesia Complications (+) PONV and history of anesthetic complications  Airway Mallampati: II  TM Distance: >3 FB Neck ROM: Full    Dental  (+) Dental Advisory Given   Pulmonary former smoker   breath sounds clear to auscultation       Cardiovascular hypertension, Pt. on medications and Pt. on home beta blockers (-) angina + CAD, + Past MI, + Cardiac Stents and + Peripheral Vascular Disease   Rhythm:Regular  1. Left ventricular ejection fraction, by estimation, is 55 to 60%. The  left ventricle has normal function. The left ventricle has no regional  wall motion abnormalities. Left ventricular diastolic parameters are  indeterminate.   2. Right ventricular systolic function is normal. The right ventricular  size is normal.   3. The mitral valve is normal in structure. No evidence of mitral valve  regurgitation. No evidence of mitral stenosis.   4. The aortic valve was not well visualized. Aortic valve regurgitation  is not visualized. No aortic stenosis is present.   5. The inferior vena cava is normal in size with greater than 50%  respiratory variability, suggesting right atrial pressure of 3 mmHg.     Neuro/Psych  Headaches, neg Seizures PSYCHIATRIC DISORDERS Anxiety Depression     Neuromuscular disease    GI/Hepatic negative GI ROS, Neg liver ROS,,,  Endo/Other  Hypothyroidism    Renal/GU negative Renal ROSLab Results      Component                Value               Date                      NA                       141                 12/04/2023                K                        3.8                 12/04/2023                CO2                      26                  12/04/2023                GLUCOSE                   118 (H)             12/04/2023                BUN                      16                  12/04/2023  CREATININE               0.88                12/04/2023                CALCIUM                   9.3                 12/04/2023                GFRNONAA                 >60                 12/04/2023                Musculoskeletal negative musculoskeletal ROS (+)    Abdominal   Peds  Hematology negative hematology ROS (+) Lab Results      Component                Value               Date                      WBC                      7.5                 12/04/2023                HGB                      12.7                12/04/2023                HCT                      39.7                12/04/2023                MCV                      96.8                12/04/2023                PLT                      290                 12/04/2023              Anesthesia Other Findings History includes former smoker (quit ~ 01/2023), emphysema, pulmonary nodules (stable 04/16/2023 CT), CAD (NSTEMI, s/p DES ost-mid LAD & DES prox CX 02/06/2023), HLD, hypothyroidism, chronic pain (following horse accident 1980's), NASH, spinal surgery (spinal cord stimulator 09/2019), cholecystectomy (05/20/2012), skin cancer (s/p MOHS left ala)   Her CAD is followed by Dr. Ladona. S/p NSTEMI with DES to LAD and CX in 01/2023. She had ED visit on 10/18/2023 while visiting [place] . She had been out to eat and drink and did not take her BP medications. She later checked her BP which was in the 200's.  She also developed some nausea and chest pressure which she felt was similar to her prior MI, so she presented to the hospital for further evaluation [facility]). She had mildly elevated, flat troponins (34-37-43-32-43) which was attributed to significantly elevated BP. D-dimer 0.64, so CTA chest was done which was negative for PE. EKG showed SR with PACs, age undetermined anterior infarct. Her BP  improved in the ED, and she was discharged.   Patient did communicate recent ED visit with Dr. Ladona, and he reviewed the records. If not recurrent symptoms and BP better controlled, then he thought she could continue with her routine cardiology follow-up.  He had previously addressed timing of c-spine surgery given DES x2 in 01/2023. Per communication threads between patient and Dr. Ladona, he advised, As it has been >6 months and you have had severe pain in your neck, go ahead and schedule for surgery and I will approve holding antiplatelet therapy now and clarified, Stop ASA 1 week and Effient  5 days, surgery of the 6-day and restart aspirin  at the earliest 81 mg daily.   She will hold Effient  5 days and ASA 81 mg for 1 week as she discussed with Dr. Ladona.   She had pulmonology visit with Orlie Milling, NP for emphysema follow-up and preoperative evaluation on 12/03/2023. PFTs showed small airway obstruction improved with bronchodilator [FVC 2.34 (89%), post 2.47 (94%); FEV1 1.73 (88%), post 1.93 (98%); FEV1/FVC 74% (98%), post 78% (104%).]  Lung nodules were stable by February chest CT.  Known mild DOE but overall good lung function.  Smoking cessation had positively impacted her lung health.  Continue Spiriva  for bronchodilation.  Continue annual lung cancer screening. In regards to surgery she wrote, From a pulmonary standpoint patient would be considered mild to moderate risk as she is fully independent not on oxygen  and pulmonary function testing shows no significant airflow obstruction or restriction.   Major Pulmonary risks identified in the multifactorial risk analysis are but not limited to a) pneumonia; b) recurrent intubation risk; c) prolonged or recurrent acute respiratory failure needing mechanical ventilation; d) prolonged hospitalization; e) DVT/Pulmonary embolism; f) Acute Pulmonary edema   Recommend 1. Short duration of surgery as much as possible and avoid paralytic if  possible . Recovery in step down or ICU with Pulmonary consultation if indicated . Aggressive pulmonary toilet with o2, bronchodilatation, and incentive spirometry and early ambulation  Six month pulmonology follow-up recommended.   She has preoperative cardiology and pulmonology input. It has been > 6 months since DES (~ 10 months). BP was 107/68 at PAT. Anesthesia team to evaluate on the day of surgery.  She was advised to bring her spinal cord stimulator remote with her on the day of surgery.  She is for ABO lab draw on arrival.   Reproductive/Obstetrics                              Anesthesia Physical Anesthesia Plan  ASA: 3  Anesthesia Plan: General   Post-op Pain Management: Ofirmev  IV (intra-op)*   Induction: Intravenous  PONV Risk Score and Plan: 4 or greater and Ondansetron , Dexamethasone , Propofol  infusion and TIVA  Airway Management Planned: Oral ETT and Video Laryngoscope Planned  Additional Equipment: ClearSight  Intra-op Plan:   Post-operative Plan: Extubation in OR  Informed Consent: I have reviewed the patients History and Physical, chart, labs and discussed the procedure including the risks, benefits and alternatives for the proposed anesthesia with  the patient or authorized representative who has indicated his/her understanding and acceptance.     Dental advisory given  Plan Discussed with: CRNA  Anesthesia Plan Comments: (PAT note written 12/07/2023 by Allison Zelenak, PA-C.  )         Anesthesia Quick Evaluation

## 2023-12-07 NOTE — Telephone Encounter (Signed)
 RX sent in

## 2023-12-09 DIAGNOSIS — I2510 Atherosclerotic heart disease of native coronary artery without angina pectoris: Secondary | ICD-10-CM | POA: Diagnosis not present

## 2023-12-09 DIAGNOSIS — I252 Old myocardial infarction: Secondary | ICD-10-CM | POA: Diagnosis not present

## 2023-12-09 DIAGNOSIS — G4762 Sleep related leg cramps: Secondary | ICD-10-CM | POA: Diagnosis not present

## 2023-12-09 DIAGNOSIS — Z0000 Encounter for general adult medical examination without abnormal findings: Secondary | ICD-10-CM | POA: Diagnosis not present

## 2023-12-09 DIAGNOSIS — F411 Generalized anxiety disorder: Secondary | ICD-10-CM | POA: Diagnosis not present

## 2023-12-09 DIAGNOSIS — E039 Hypothyroidism, unspecified: Secondary | ICD-10-CM | POA: Diagnosis not present

## 2023-12-09 DIAGNOSIS — E782 Mixed hyperlipidemia: Secondary | ICD-10-CM | POA: Diagnosis not present

## 2023-12-09 DIAGNOSIS — E1169 Type 2 diabetes mellitus with other specified complication: Secondary | ICD-10-CM | POA: Diagnosis not present

## 2023-12-09 DIAGNOSIS — I10 Essential (primary) hypertension: Secondary | ICD-10-CM | POA: Diagnosis not present

## 2023-12-09 DIAGNOSIS — D692 Other nonthrombocytopenic purpura: Secondary | ICD-10-CM | POA: Diagnosis not present

## 2023-12-09 DIAGNOSIS — I700 Atherosclerosis of aorta: Secondary | ICD-10-CM | POA: Diagnosis not present

## 2023-12-09 DIAGNOSIS — J432 Centrilobular emphysema: Secondary | ICD-10-CM | POA: Diagnosis not present

## 2023-12-10 ENCOUNTER — Encounter (HOSPITAL_COMMUNITY): Payer: Self-pay

## 2023-12-11 ENCOUNTER — Inpatient Hospital Stay (HOSPITAL_COMMUNITY)
Admission: RE | Disposition: A | Discharge status: Home / Self Care | Payer: Self-pay | Source: Home / Self Care | Attending: Neurosurgery

## 2023-12-11 ENCOUNTER — Other Ambulatory Visit (HOSPITAL_COMMUNITY): Payer: Self-pay

## 2023-12-11 ENCOUNTER — Inpatient Hospital Stay (HOSPITAL_COMMUNITY)

## 2023-12-11 ENCOUNTER — Inpatient Hospital Stay (HOSPITAL_COMMUNITY): Payer: Self-pay | Admitting: Vascular Surgery

## 2023-12-11 ENCOUNTER — Encounter (HOSPITAL_COMMUNITY): Payer: Self-pay | Admitting: Neurosurgery

## 2023-12-11 ENCOUNTER — Inpatient Hospital Stay (HOSPITAL_COMMUNITY)
Admission: RE | Admit: 2023-12-11 | Discharge: 2023-12-12 | DRG: 472 | Disposition: A | Discharge status: Home / Self Care | Attending: Neurosurgery | Admitting: Neurosurgery

## 2023-12-11 DIAGNOSIS — Z7982 Long term (current) use of aspirin: Secondary | ICD-10-CM

## 2023-12-11 DIAGNOSIS — I2510 Atherosclerotic heart disease of native coronary artery without angina pectoris: Secondary | ICD-10-CM | POA: Diagnosis present

## 2023-12-11 DIAGNOSIS — Z880 Allergy status to penicillin: Secondary | ICD-10-CM

## 2023-12-11 DIAGNOSIS — I10 Essential (primary) hypertension: Secondary | ICD-10-CM

## 2023-12-11 DIAGNOSIS — M4712 Other spondylosis with myelopathy, cervical region: Secondary | ICD-10-CM | POA: Diagnosis not present

## 2023-12-11 DIAGNOSIS — Z8249 Family history of ischemic heart disease and other diseases of the circulatory system: Secondary | ICD-10-CM

## 2023-12-11 DIAGNOSIS — Z79899 Other long term (current) drug therapy: Secondary | ICD-10-CM

## 2023-12-11 DIAGNOSIS — Z818 Family history of other mental and behavioral disorders: Secondary | ICD-10-CM | POA: Diagnosis not present

## 2023-12-11 DIAGNOSIS — Z7902 Long term (current) use of antithrombotics/antiplatelets: Secondary | ICD-10-CM

## 2023-12-11 DIAGNOSIS — M47812 Spondylosis without myelopathy or radiculopathy, cervical region: Secondary | ICD-10-CM | POA: Diagnosis not present

## 2023-12-11 DIAGNOSIS — E785 Hyperlipidemia, unspecified: Secondary | ICD-10-CM | POA: Diagnosis not present

## 2023-12-11 DIAGNOSIS — M4722 Other spondylosis with radiculopathy, cervical region: Secondary | ICD-10-CM | POA: Diagnosis present

## 2023-12-11 DIAGNOSIS — I252 Old myocardial infarction: Secondary | ICD-10-CM | POA: Diagnosis not present

## 2023-12-11 DIAGNOSIS — Z811 Family history of alcohol abuse and dependence: Secondary | ICD-10-CM

## 2023-12-11 DIAGNOSIS — M4802 Spinal stenosis, cervical region: Principal | ICD-10-CM | POA: Diagnosis present

## 2023-12-11 DIAGNOSIS — Z981 Arthrodesis status: Secondary | ICD-10-CM | POA: Diagnosis not present

## 2023-12-11 DIAGNOSIS — Z882 Allergy status to sulfonamides status: Secondary | ICD-10-CM

## 2023-12-11 DIAGNOSIS — E039 Hypothyroidism, unspecified: Secondary | ICD-10-CM | POA: Diagnosis present

## 2023-12-11 DIAGNOSIS — Z9049 Acquired absence of other specified parts of digestive tract: Secondary | ICD-10-CM

## 2023-12-11 DIAGNOSIS — G8929 Other chronic pain: Secondary | ICD-10-CM | POA: Diagnosis present

## 2023-12-11 DIAGNOSIS — I739 Peripheral vascular disease, unspecified: Secondary | ICD-10-CM | POA: Diagnosis not present

## 2023-12-11 DIAGNOSIS — Z91030 Bee allergy status: Secondary | ICD-10-CM

## 2023-12-11 DIAGNOSIS — M542 Cervicalgia: Secondary | ICD-10-CM

## 2023-12-11 DIAGNOSIS — Z955 Presence of coronary angioplasty implant and graft: Secondary | ICD-10-CM

## 2023-12-11 DIAGNOSIS — Z87891 Personal history of nicotine dependence: Secondary | ICD-10-CM

## 2023-12-11 DIAGNOSIS — Z800 Family history of malignant neoplasm of digestive organs: Secondary | ICD-10-CM | POA: Diagnosis not present

## 2023-12-11 DIAGNOSIS — Z79890 Hormone replacement therapy (postmenopausal): Secondary | ICD-10-CM | POA: Diagnosis not present

## 2023-12-11 DIAGNOSIS — Z4682 Encounter for fitting and adjustment of non-vascular catheter: Secondary | ICD-10-CM | POA: Diagnosis not present

## 2023-12-11 HISTORY — DX: Nausea with vomiting, unspecified: R11.2

## 2023-12-11 HISTORY — PX: ANTERIOR CERVICAL DECOMP/DISCECTOMY FUSION: SHX1161

## 2023-12-11 LAB — ABO/RH: ABO/RH(D): B NEG

## 2023-12-11 SURGERY — ANTERIOR CERVICAL DECOMPRESSION/DISCECTOMY FUSION 3 LEVELS
Anesthesia: General | Site: Neck

## 2023-12-11 MED ORDER — ASPIRIN 81 MG PO TBEC
81.0000 mg | DELAYED_RELEASE_TABLET | Freq: Every day | ORAL | Status: DC
  Administered 2023-12-12: 81 mg via ORAL
  Filled 2023-12-11: qty 1

## 2023-12-11 MED ORDER — ROSUVASTATIN CALCIUM 20 MG PO TABS
20.0000 mg | ORAL_TABLET | Freq: Every day | ORAL | Status: DC
  Administered 2023-12-11 – 2023-12-12 (×2): 20 mg via ORAL
  Filled 2023-12-11 (×3): qty 1

## 2023-12-11 MED ORDER — SODIUM CHLORIDE 0.9 % IV SOLN
250.0000 mL | INTRAVENOUS | Status: DC
  Administered 2023-12-11: 250 mL via INTRAVENOUS

## 2023-12-11 MED ORDER — PANTOPRAZOLE SODIUM 40 MG IV SOLR: 40.0000 mg | Freq: Every day | INTRAVENOUS | Status: DC

## 2023-12-11 MED ORDER — THROMBIN 5000 UNITS EX SOLR
CUTANEOUS | Status: DC | PRN
  Administered 2023-12-11 (×2): 5 mL via TOPICAL

## 2023-12-11 MED ORDER — LIDOCAINE 2% (20 MG/ML) 5 ML SYRINGE
INTRAMUSCULAR | Status: AC
  Filled 2023-12-11: qty 5

## 2023-12-11 MED ORDER — ISOSORBIDE DINITRATE 30 MG PO TABS
30.0000 mg | ORAL_TABLET | Freq: Three times a day (TID) | ORAL | Status: DC
  Administered 2023-12-11 – 2023-12-12 (×2): 30 mg via ORAL
  Filled 2023-12-11 (×5): qty 1

## 2023-12-11 MED ORDER — LIDOCAINE 2% (20 MG/ML) 5 ML SYRINGE
INTRAMUSCULAR | Status: DC | PRN
  Administered 2023-12-11: 60 mg via INTRAVENOUS

## 2023-12-11 MED ORDER — METOPROLOL SUCCINATE ER 25 MG PO TB24
25.0000 mg | ORAL_TABLET | Freq: Every day | ORAL | Status: DC
Start: 2023-12-12 — End: 2023-12-12
  Administered 2023-12-12: 25 mg via ORAL
  Filled 2023-12-11: qty 1

## 2023-12-11 MED ORDER — HYDRALAZINE HCL 50 MG PO TABS
50.0000 mg | ORAL_TABLET | Freq: Three times a day (TID) | ORAL | Status: DC
  Administered 2023-12-11 – 2023-12-12 (×2): 50 mg via ORAL
  Filled 2023-12-11 (×2): qty 1

## 2023-12-11 MED ORDER — DULOXETINE HCL 60 MG PO CPEP
60.0000 mg | ORAL_CAPSULE | Freq: Every day | ORAL | Status: DC
Start: 2023-12-11 — End: 2023-12-12
  Administered 2023-12-11 – 2023-12-12 (×2): 60 mg via ORAL
  Filled 2023-12-11 (×3): qty 1

## 2023-12-11 MED ORDER — 0.9 % SODIUM CHLORIDE (POUR BTL) OPTIME
TOPICAL | Status: DC | PRN
  Administered 2023-12-11: 1000 mL

## 2023-12-11 MED ORDER — LEVOTHYROXINE SODIUM 100 MCG PO TABS: 100.0000 ug | ORAL_TABLET | Freq: Every morning | ORAL | Status: DC

## 2023-12-11 MED ORDER — OXYCODONE-ACETAMINOPHEN 5-325 MG PO TABS
1.0000 | ORAL_TABLET | ORAL | Status: DC | PRN
  Administered 2023-12-11 – 2023-12-12 (×4): 1 via ORAL
  Filled 2023-12-11 (×5): qty 1

## 2023-12-11 MED ORDER — ONDANSETRON HCL 4 MG/2ML IJ SOLN: 4.0000 mg | Freq: Four times a day (QID) | INTRAMUSCULAR | Status: DC | PRN

## 2023-12-11 MED ORDER — ORAL CARE MOUTH RINSE: 15.0000 mL | Freq: Once | OROMUCOSAL | Status: AC

## 2023-12-11 MED ORDER — D3 + K2 125-100 MCG PO CAPS
ORAL_CAPSULE | Freq: Every day | ORAL | Status: DC
Start: 2023-12-11 — End: 2023-12-11

## 2023-12-11 MED ORDER — OXYCODONE HCL 5 MG/5ML PO SOLN
5.0000 mg | Freq: Once | ORAL | Status: AC | PRN
  Administered 2023-12-11: 5 mg via ORAL

## 2023-12-11 MED ORDER — SUGAMMADEX SODIUM 200 MG/2ML IV SOLN
INTRAVENOUS | Status: DC | PRN
  Administered 2023-12-11: 200 mg via INTRAVENOUS

## 2023-12-11 MED ORDER — ACETAMINOPHEN 10 MG/ML IV SOLN
1000.0000 mg | Freq: Once | INTRAVENOUS | Status: DC | PRN
  Administered 2023-12-11: 1000 mg via INTRAVENOUS

## 2023-12-11 MED ORDER — CHLORHEXIDINE GLUCONATE CLOTH 2 % EX PADS
6.0000 | MEDICATED_PAD | Freq: Once | CUTANEOUS | Status: AC
  Administered 2023-12-11: 6 via TOPICAL

## 2023-12-11 MED ORDER — LACTATED RINGERS IV SOLN: INTRAVENOUS | Status: DC | PRN

## 2023-12-11 MED ORDER — ONDANSETRON HCL 4 MG PO TABS: 4.0000 mg | ORAL_TABLET | Freq: Four times a day (QID) | ORAL | Status: DC | PRN

## 2023-12-11 MED ORDER — MENTHOL 3 MG MT LOZG: 1.0000 | LOZENGE | OROMUCOSAL | Status: DC | PRN

## 2023-12-11 MED ORDER — ROCURONIUM BROMIDE 10 MG/ML (PF) SYRINGE
PREFILLED_SYRINGE | INTRAVENOUS | Status: AC
  Filled 2023-12-11: qty 10

## 2023-12-11 MED ORDER — PANTOPRAZOLE SODIUM 40 MG PO TBEC
40.0000 mg | DELAYED_RELEASE_TABLET | Freq: Every day | ORAL | Status: DC
  Administered 2023-12-11: 40 mg via ORAL
  Filled 2023-12-11: qty 1

## 2023-12-11 MED ORDER — PROPOFOL 10 MG/ML IV BOLUS
INTRAVENOUS | Status: AC
  Filled 2023-12-11: qty 20

## 2023-12-11 MED ORDER — PHENYLEPHRINE 80 MCG/ML (10ML) SYRINGE FOR IV PUSH (FOR BLOOD PRESSURE SUPPORT)
PREFILLED_SYRINGE | INTRAVENOUS | Status: AC
Start: 2023-12-11 — End: 2023-12-11
  Filled 2023-12-11: qty 10

## 2023-12-11 MED ORDER — DEXAMETHASONE SOD PHOSPHATE PF 10 MG/ML IJ SOLN
INTRAMUSCULAR | Status: DC | PRN
  Administered 2023-12-11: 10 mg via INTRAVENOUS

## 2023-12-11 MED ORDER — TIOTROPIUM BROMIDE 1.25 MCG/ACT IN AERS: 2.0000 | INHALATION_SPRAY | Freq: Every day | RESPIRATORY_TRACT | Status: DC

## 2023-12-11 MED ORDER — FENTANYL CITRATE (PF) 100 MCG/2ML IJ SOLN
25.0000 ug | INTRAMUSCULAR | Status: DC | PRN
  Administered 2023-12-11 (×2): 50 ug via INTRAVENOUS

## 2023-12-11 MED ORDER — HYDROXYZINE HCL 25 MG PO TABS: 25.0000 mg | ORAL_TABLET | Freq: Three times a day (TID) | ORAL | Status: DC | PRN

## 2023-12-11 MED ORDER — CHLORHEXIDINE GLUCONATE CLOTH 2 % EX PADS: 6.0000 | MEDICATED_PAD | Freq: Once | CUTANEOUS | Status: DC

## 2023-12-11 MED ORDER — ACETAMINOPHEN 10 MG/ML IV SOLN
INTRAVENOUS | Status: AC
  Filled 2023-12-11: qty 100

## 2023-12-11 MED ORDER — UMECLIDINIUM BROMIDE 62.5 MCG/ACT IN AEPB
1.0000 | INHALATION_SPRAY | Freq: Every day | RESPIRATORY_TRACT | Status: DC
Start: 2023-12-11 — End: 2023-12-12
  Filled 2023-12-11: qty 7

## 2023-12-11 MED ORDER — PHENOL 1.4 % MT LIQD: 1.0000 | OROMUCOSAL | Status: DC | PRN

## 2023-12-11 MED ORDER — NITROGLYCERIN 0.4 MG SL SUBL: 0.4000 mg | SUBLINGUAL_TABLET | SUBLINGUAL | Status: DC | PRN

## 2023-12-11 MED ORDER — THROMBIN 20000 UNITS EX SOLR
CUTANEOUS | Status: DC | PRN
  Administered 2023-12-11: 20 mL via TOPICAL

## 2023-12-11 MED ORDER — LEVOTHYROXINE SODIUM 100 MCG PO TABS
100.0000 ug | ORAL_TABLET | Freq: Every day | ORAL | Status: DC
  Administered 2023-12-12: 100 ug via ORAL
  Filled 2023-12-11: qty 1

## 2023-12-11 MED ORDER — ONDANSETRON HCL 4 MG/2ML IJ SOLN
INTRAMUSCULAR | Status: AC
Start: 2023-12-11 — End: 2023-12-11
  Filled 2023-12-11: qty 2

## 2023-12-11 MED ORDER — THROMBIN 20000 UNITS EX SOLR
CUTANEOUS | Status: AC
Start: 2023-12-11 — End: 2023-12-11
  Filled 2023-12-11: qty 20000

## 2023-12-11 MED ORDER — HYDROMORPHONE HCL 1 MG/ML IJ SOLN: 0.5000 mg | INTRAMUSCULAR | Status: DC | PRN

## 2023-12-11 MED ORDER — DOCUSATE SODIUM 100 MG PO CAPS
100.0000 mg | ORAL_CAPSULE | Freq: Two times a day (BID) | ORAL | Status: DC | PRN
  Administered 2023-12-11: 100 mg via ORAL
  Filled 2023-12-11: qty 1

## 2023-12-11 MED ORDER — CYCLOBENZAPRINE HCL 10 MG PO TABS
10.0000 mg | ORAL_TABLET | Freq: Three times a day (TID) | ORAL | Status: DC | PRN
  Administered 2023-12-11 (×2): 10 mg via ORAL
  Filled 2023-12-11 (×2): qty 1

## 2023-12-11 MED ORDER — VALACYCLOVIR HCL 500 MG PO TABS: 1000.0000 mg | ORAL_TABLET | Freq: Two times a day (BID) | ORAL | Status: DC | PRN

## 2023-12-11 MED ORDER — FENTANYL CITRATE (PF) 100 MCG/2ML IJ SOLN
INTRAMUSCULAR | Status: AC
  Filled 2023-12-11: qty 2

## 2023-12-11 MED ORDER — METOPROLOL SUCCINATE ER 25 MG PO TB24: 25.0000 mg | ORAL_TABLET | Freq: Every day | ORAL | Status: DC

## 2023-12-11 MED ORDER — FENTANYL CITRATE (PF) 250 MCG/5ML IJ SOLN
INTRAMUSCULAR | Status: DC | PRN
  Administered 2023-12-11: 100 ug via INTRAVENOUS
  Administered 2023-12-11 (×3): 50 ug via INTRAVENOUS

## 2023-12-11 MED ORDER — THROMBIN 5000 UNITS EX KIT
PACK | CUTANEOUS | Status: AC
Start: 2023-12-11 — End: 2023-12-11
  Filled 2023-12-11: qty 1

## 2023-12-11 MED ORDER — METOPROLOL SUCCINATE ER 25 MG PO TB24
ORAL_TABLET | ORAL | Status: AC
Start: 2023-12-11 — End: 2023-12-11
  Administered 2023-12-11: 25 mg via ORAL
  Filled 2023-12-11: qty 1

## 2023-12-11 MED ORDER — MIDAZOLAM HCL (PF) 2 MG/2ML IJ SOLN
INTRAMUSCULAR | Status: DC | PRN
Start: 2023-12-11 — End: 2023-12-11
  Administered 2023-12-11: 1 mg via INTRAVENOUS

## 2023-12-11 MED ORDER — CEFAZOLIN SODIUM-DEXTROSE 2-4 GM/100ML-% IV SOLN
2.0000 g | INTRAVENOUS | Status: AC
  Administered 2023-12-11: 2 g via INTRAVENOUS
  Filled 2023-12-11: qty 100

## 2023-12-11 MED ORDER — MIDAZOLAM HCL 2 MG/2ML IJ SOLN
INTRAMUSCULAR | Status: AC
  Filled 2023-12-11: qty 2

## 2023-12-11 MED ORDER — PHENYLEPHRINE 80 MCG/ML (10ML) SYRINGE FOR IV PUSH (FOR BLOOD PRESSURE SUPPORT)
PREFILLED_SYRINGE | INTRAVENOUS | Status: DC | PRN
  Administered 2023-12-11 (×2): 80 ug via INTRAVENOUS

## 2023-12-11 MED ORDER — PROPOFOL 10 MG/ML IV BOLUS
INTRAVENOUS | Status: DC | PRN
  Administered 2023-12-11: 200 mg via INTRAVENOUS
  Administered 2023-12-11: 125 ug/kg/min via INTRAVENOUS

## 2023-12-11 MED ORDER — LOSARTAN POTASSIUM 50 MG PO TABS
100.0000 mg | ORAL_TABLET | Freq: Every day | ORAL | Status: DC
  Administered 2023-12-11 – 2023-12-12 (×2): 100 mg via ORAL
  Filled 2023-12-11 (×3): qty 2

## 2023-12-11 MED ORDER — SENNA 8.6 MG PO TABS
1.0000 | ORAL_TABLET | Freq: Two times a day (BID) | ORAL | Status: DC | PRN
  Administered 2023-12-11: 8.6 mg via ORAL
  Filled 2023-12-11: qty 1

## 2023-12-11 MED ORDER — FENTANYL CITRATE (PF) 250 MCG/5ML IJ SOLN
INTRAMUSCULAR | Status: AC
  Filled 2023-12-11: qty 5

## 2023-12-11 MED ORDER — ALUM & MAG HYDROXIDE-SIMETH 200-200-20 MG/5ML PO SUSP: 30.0000 mL | Freq: Four times a day (QID) | ORAL | Status: DC | PRN

## 2023-12-11 MED ORDER — AMLODIPINE BESYLATE 10 MG PO TABS
10.0000 mg | ORAL_TABLET | Freq: Every day | ORAL | Status: DC
  Filled 2023-12-11: qty 1

## 2023-12-11 MED ORDER — SODIUM CHLORIDE 0.9% FLUSH: 3.0000 mL | INTRAVENOUS | Status: DC | PRN

## 2023-12-11 MED ORDER — HYDROCODONE-ACETAMINOPHEN 5-325 MG PO TABS
2.0000 | ORAL_TABLET | ORAL | Status: DC | PRN
  Filled 2023-12-11: qty 2

## 2023-12-11 MED ORDER — ACETAMINOPHEN 500 MG PO TABS: 500.0000 mg | ORAL_TABLET | Freq: Four times a day (QID) | ORAL | Status: DC | PRN

## 2023-12-11 MED ORDER — SODIUM CHLORIDE 0.9% FLUSH
3.0000 mL | Freq: Two times a day (BID) | INTRAVENOUS | Status: DC
  Administered 2023-12-11 (×2): 3 mL via INTRAVENOUS

## 2023-12-11 MED ORDER — OXYCODONE HCL 5 MG/5ML PO SOLN
ORAL | Status: AC
  Filled 2023-12-11: qty 5

## 2023-12-11 MED ORDER — ONDANSETRON HCL 4 MG/2ML IJ SOLN
INTRAMUSCULAR | Status: DC | PRN
  Administered 2023-12-11: 4 mg via INTRAVENOUS

## 2023-12-11 MED ORDER — OXYCODONE HCL 5 MG PO TABS: 5.0000 mg | ORAL_TABLET | Freq: Once | ORAL | Status: AC | PRN

## 2023-12-11 MED ORDER — CEFAZOLIN SODIUM-DEXTROSE 2-4 GM/100ML-% IV SOLN
2.0000 g | Freq: Three times a day (TID) | INTRAVENOUS | Status: DC
  Administered 2023-12-11 – 2023-12-12 (×3): 2 g via INTRAVENOUS
  Filled 2023-12-11 (×3): qty 100

## 2023-12-11 MED ORDER — ROCURONIUM BROMIDE 10 MG/ML (PF) SYRINGE
PREFILLED_SYRINGE | INTRAVENOUS | Status: DC | PRN
  Administered 2023-12-11: 40 mg via INTRAVENOUS
  Administered 2023-12-11: 20 mg via INTRAVENOUS
  Administered 2023-12-11: 60 mg via INTRAVENOUS

## 2023-12-11 MED ORDER — LACTATED RINGERS IV SOLN
INTRAVENOUS | Status: DC
Start: 2023-12-11 — End: 2023-12-11

## 2023-12-11 MED ORDER — THROMBIN 5000 UNITS EX KIT
PACK | CUTANEOUS | Status: AC
  Filled 2023-12-11: qty 1

## 2023-12-11 MED ORDER — ESTRADIOL 10 MCG VA TABS: 10.0000 ug | ORAL_TABLET | VAGINAL | Status: DC

## 2023-12-11 MED ORDER — PHENYLEPHRINE HCL-NACL 20-0.9 MG/250ML-% IV SOLN
INTRAVENOUS | Status: DC | PRN
Start: 2023-12-11 — End: 2023-12-11
  Administered 2023-12-11: 20 ug/min via INTRAVENOUS

## 2023-12-11 MED ORDER — CHLORHEXIDINE GLUCONATE 0.12 % MT SOLN
15.0000 mL | Freq: Once | OROMUCOSAL | Status: AC
  Administered 2023-12-11: 15 mL via OROMUCOSAL
  Filled 2023-12-11: qty 15

## 2023-12-11 SURGICAL SUPPLY — 50 items
BAG COUNTER SPONGE SURGICOUNT (BAG) ×1 IMPLANT
BAND RUBBER #18 3X1/16 STRL (MISCELLANEOUS) ×2 IMPLANT
BASKET BONE COLLECTION (BASKET) ×1 IMPLANT
BENZOIN TINCTURE PRP APPL 2/3 (GAUZE/BANDAGES/DRESSINGS) ×1 IMPLANT
BIT DRILL 13 (BIT) IMPLANT
BIT DRILL NEURO 2X3.1 SFT TUCH (MISCELLANEOUS) ×1 IMPLANT
BUR MATCHSTICK NEURO 3.0 LAGG (BURR) ×1 IMPLANT
CANISTER SUCTION 3000ML PPV (SUCTIONS) ×1 IMPLANT
CHIPS BONE CANC-ACS11X14X6 6D (Bone Implant) IMPLANT
CHIPS BONE CANC-ACS11X14X7 6D (Bone Implant) IMPLANT
DERMABOND ADVANCED .7 DNX12 (GAUZE/BANDAGES/DRESSINGS) ×1 IMPLANT
DRAPE C-ARM 42X72 X-RAY (DRAPES) ×2 IMPLANT
DRAPE LAPAROTOMY 100X72 PEDS (DRAPES) ×1 IMPLANT
DRAPE MICROSCOPE SLANT 54X150 (MISCELLANEOUS) ×1 IMPLANT
DRSG OPSITE POSTOP 4X6 (GAUZE/BANDAGES/DRESSINGS) IMPLANT
DURAPREP 6ML APPLICATOR 50/CS (WOUND CARE) ×1 IMPLANT
ELECT COATED BLADE 2.86 ST (ELECTRODE) ×1 IMPLANT
ELECTRODE REM PT RTRN 9FT ADLT (ELECTROSURGICAL) ×1 IMPLANT
GAUZE 4X4 16PLY ~~LOC~~+RFID DBL (SPONGE) IMPLANT
GAUZE SPONGE 4X4 12PLY STRL (GAUZE/BANDAGES/DRESSINGS) ×1 IMPLANT
GLOVE BIO SURGEON STRL SZ7 (GLOVE) IMPLANT
GLOVE BIO SURGEON STRL SZ8 (GLOVE) ×1 IMPLANT
GLOVE BIOGEL PI IND STRL 7.0 (GLOVE) IMPLANT
GLOVE ECLIPSE 7.0 STRL STRAW (GLOVE) IMPLANT
GLOVE INDICATOR 8.5 STRL (GLOVE) ×1 IMPLANT
GOWN STRL REUS W/ TWL LRG LVL3 (GOWN DISPOSABLE) ×1 IMPLANT
GOWN STRL REUS W/ TWL XL LVL3 (GOWN DISPOSABLE) ×1 IMPLANT
GOWN STRL REUS W/TWL 2XL LVL3 (GOWN DISPOSABLE) IMPLANT
HALTER HD/CHIN CERV TRACTION D (MISCELLANEOUS) ×1 IMPLANT
HEMOSTAT POWDER KIT SURGIFOAM (HEMOSTASIS) ×1 IMPLANT
KIT BASIN OR (CUSTOM PROCEDURE TRAY) ×1 IMPLANT
KIT TURNOVER KIT B (KITS) ×1 IMPLANT
NDL SPNL 20GX3.5 QUINCKE YW (NEEDLE) ×1 IMPLANT
NEEDLE SPNL 20GX3.5 QUINCKE YW (NEEDLE) ×1 IMPLANT
PACK LAMINECTOMY NEURO (CUSTOM PROCEDURE TRAY) ×1 IMPLANT
PIN DISTRACTION 14MM (PIN) IMPLANT
PLATE ANT CERV ATL ELITE 57.5 (Plate) IMPLANT
SCREW BN 13X4XSLF DRL FXANG (Screw) IMPLANT
SCREW ST FIX 4 ATL 3120213 (Screw) IMPLANT
SOLN 0.9% NACL POUR BTL 1000ML (IV SOLUTION) ×1 IMPLANT
SOLN STERILE WATER BTL 1000 ML (IV SOLUTION) ×1 IMPLANT
SPIKE FLUID TRANSFER (MISCELLANEOUS) ×1 IMPLANT
SPONGE INTESTINAL PEANUT (DISPOSABLE) ×1 IMPLANT
SPONGE SURGIFOAM ABS GEL 100 (HEMOSTASIS) ×1 IMPLANT
STRIP CLOSURE SKIN 1/2X4 (GAUZE/BANDAGES/DRESSINGS) ×1 IMPLANT
SUT VIC AB 3-0 SH 8-18 (SUTURE) ×1 IMPLANT
SUT VIC AB 4-0 PS2 27 (SUTURE) ×1 IMPLANT
TAPE CLOTH 4X10 WHT NS (GAUZE/BANDAGES/DRESSINGS) IMPLANT
TOWEL GREEN STERILE (TOWEL DISPOSABLE) ×1 IMPLANT
TOWEL GREEN STERILE FF (TOWEL DISPOSABLE) ×1 IMPLANT

## 2023-12-11 NOTE — Op Note (Signed)
 Preoperative diagnosis: Cervical spondylosis with stenosis and radiculopathy C3-4, C4-5, C5-6.  Postoperative diagnosis: Same.  Procedure: Anterior cervical discectomies and fusion and C3-4 C4-5 C5-6 utilizing allograft and anterior cervical plating with Atlantis plating system.  Surgeon: Arley helling.  Assistant: Suzen Click.  Anesthesia: General.  EBL: Minimal.  HPI: 78 year old female progressive worsening neck pain right shoulder and arm pain workup revealed severe cord compression spinal stenosis and foraminal stenosis at C3-4, C4-5, and C5-6.  Due to the patient's progression of clinical syndrome imaging findings failed conservative treatment I recommended anterior cervical discectomies and fusion at those 3 levels.  I extensively reviewed the risks and benefits of the operation with the patient as well as perioperative course expectations of outcome and alternatives to surgery and she understood and agreed to proceed forward.  Operative procedure: Patient was brought into the OR was induced under general anesthesia positioned supine the neck in slight extension 5 pounds of halter traction.  The right side of her neck was prepped and draped in routine sterile fashion.  Preoperative x-ray localized the appropriate level.  So a curvilinear incision was made just off midline to the intraportal sternocleidomastoid and the superficial layer of the platysma was dissected out and divided longitudinally.  The avascular plane between the cervical mass and strap was was developed down to the prevertebral fascia and prevertebral fascia was dissected away with Kitners.  Interoperative x-ray confirmed identification appropriate level so longus close reflected laterally self-retaining retractors were placed.  Marked spondylosis and anterior osteophytes were all identified and removed in piecemeal fashion with a Leksell rongeur and a 2 and 3 Miller Kerrison punch.  All 3 disc bases were drilled down the  posterior annulus and osteophyte complex.  Under microscopic lamination first working at C3-4 further drilling down the posterior osteophyte complex allowed defecation posterior logical ligament which was removed in piecemeal fashion aggressively under biting both endplates and marching laterally identified the C4 pedicles and the C4 nerve root was decompressed and skeletonized flush with the pedicle.  This was then packed with Gelfoam after adequate decompression eventually both centrally and foraminally and then attention was taken to C4-5.  At C4-5 in a similar fashion pathology was marked spondylosis was large central spurs and marked foraminal stenosis this was all widely decompressed and after adequate central and foraminal decompression was achieved and taken to C5-6 and C5-6 was a large leftward spur this was all drilled away removed decompressing both C6 nerve root skeletonized flush with pedicle and aggressively under biting both endplates decompress central canal.  I then sized up allograft spacer 6 mm at C3-4 and C5-6 7 mm at C4-5 all grafts were inserted 2 to 3 mm deep to the intervertebral line.  I then initially tried in a Lance translational plate but the lordosis built in the plate was too much for her loss of lordosis so we switched to a regular Atlantis bent the plate and contoured it to the front of the spine and anchored all the screws all the screws were placed locking mechanisms were engaged wound was copiously irrigated meticulous hemostasis was maintained drain was placed and the wound was closed in layers with interrupted Vicryl and the skin was closed running 4 subcuticular.  Dermabond benzoin Steri-Strips and sterile dressing was applied patient to cover room in stable condition.  At the end the case all needle counts and sponge counts were correct.

## 2023-12-11 NOTE — H&P (Signed)
 Amanda Kim is an 78 y.o. female.   Chief Complaint: Neck and right arm pain HPI: 78-year female progressive worsening neck and right shoulder and arm pain workup revealed severe cervical spondylosis with cord compression C3-4 C4-5 C5-6.  Due to patient's progression of clinical syndrome imaging findings of failed conservative treatment I recommended anterior cervical discectomies and fusion at those 3 levels.  I extensively reviewed the risks and benefits of the operation with the patient as well as perioperative course expectations of outcome and alternatives to surgery and she understood and agreed to proceed forward.  She did have some spondylitic disease at C6-7 that was not causing cord compression or significant foraminal stenosis so I did not recommend fusing this level as well.  Past Medical History:  Diagnosis Date   Back pain    d/t horse fell on pt in the 80's   Bone spur    in neck(since 1990) and 1st 3 fingers on right hand go numb   Chronic pain    d/t horse accident in the 80's   Headache(784.0)    tension   History of colon polyps    Hyperlipidemia    takes Fish Oil and CoQ10   Hypertension    takes Inderal  daily   Hypothyroid 05/07/2011   takes Synthroid  daily   Leg cramps    takes Potassium prn   Liver disease    nonalcholic fatty liver disease   Myocardial infarction (HCC)    Nausea & vomiting    phenergan  and zofran  prn   PONV (postoperative nausea and vomiting)    Spondylolisthesis    Spondylosis of thoracolumbar region without myelopathy or radiculopathy    spine   Urinary frequency     Past Surgical History:  Procedure Laterality Date   CESAREAN SECTION  1967, 1970   CHOLECYSTECTOMY N/A 05/20/2012   Procedure: LAPAROSCOPIC CHOLECYSTECTOMY WITH INTRAOPERATIVE CHOLANGIOGRAM;  Surgeon: Deward GORMAN Curvin DOUGLAS, MD;  Location: [facility] OR;  Service: General;  Laterality: N/A;   COLONOSCOPY     CORONARY STENT INTERVENTION N/A 02/06/2023   Procedure: CORONARY  STENT INTERVENTION;  Surgeon: Wonda Sharper, MD;  Location: [facility] INVASIVE CV LAB;  Service: Cardiovascular;  Laterality: N/A;   CORONARY ULTRASOUND/IVUS N/A 02/06/2023   Procedure: Coronary Ultrasound/IVUS;  Surgeon: Wonda Sharper, MD;  Location: [facility] INVASIVE CV LAB;  Service: Cardiovascular;  Laterality: N/A;   LEFT HEART CATH AND CORONARY ANGIOGRAPHY N/A 02/06/2023   Procedure: LEFT HEART CATH AND CORONARY ANGIOGRAPHY;  Surgeon: Wonda Sharper, MD;  Location: [facility] INVASIVE CV LAB;  Service: Cardiovascular;  Laterality: N/A;   SPINAL CORD STIMULATOR IMPLANT  09/2019   TUBAL LIGATION  02/18/1979    Family History  Problem Relation Age of Onset   Hypertension Mother    Alcohol  abuse Mother    Depression Father    Bipolar disorder Father    Suicidality Sister    Heart disease Paternal Uncle    Hyperlipidemia Paternal Uncle    Colon cancer Paternal Grandmother    Cancer Paternal Grandmother        colon   Heart attack Paternal Grandfather    Social History:  reports that she has quit smoking. Her smoking use included cigarettes. She has never used smokeless tobacco. She reports current alcohol  use. She reports current drug use. Drug: Other-see comments.  Allergies:  Allergies  Allergen Reactions   Sulfisoxazole Itching   Bee Venom Itching, Swelling and Rash   Penicillins Itching and Swelling    Medications Prior to  Admission  Medication Sig Dispense Refill   acetaminophen  (TYLENOL ) 500 MG tablet Take 500-1,000 mg by mouth every 6 (six) hours as needed (pain.).     aspirin  EC 81 MG tablet Take 1 tablet (81 mg total) by mouth daily. Swallow whole. 30 tablet 11   diclofenac  Sodium (VOLTAREN  ARTHRITIS PAIN) 1 % GEL Apply 1 Application topically 4 (four) times daily as needed (pain.).     DULoxetine  (CYMBALTA ) 60 MG capsule Take 1 capsule (60 mg total) by mouth daily. 90 capsule 1   Estradiol  (VAGIFEM ) 10 MCG TABS vaginal tablet Place 10 mcg vaginally 2 (two) times a week.      hydrALAZINE  (APRESOLINE ) 50 MG tablet Take 1 tablet (50 mg total) by mouth 3 (three) times daily. 270 tablet 2   hydrOXYzine  (ATARAX ) 25 MG tablet Take 25 mg by mouth 3 (three) times daily as needed for anxiety (or sleep).     isosorbide  dinitrate (ISORDIL ) 30 MG tablet Take 1 tablet (30 mg total) by mouth 3 (three) times daily. 270 tablet 2   levothyroxine  (SYNTHROID ) 100 MCG tablet Take 1 tablet (100 mcg total) by mouth in the morning on an empty stomach. 90 tablet 3   lidocaine  (LIDODERM ) 5 % Apply 1 patch to skin per package directions, leave on most painful area for up to 12 hours. Max 1 patch daily (Patient taking differently: Place 1 patch onto the skin daily as needed (pain.).) 10 patch 0   losartan  (COZAAR ) 100 MG tablet Take 1 tablet (100 mg total) by mouth daily. 30 tablet 2   metoprolol  succinate (TOPROL  XL) 25 MG 24 hr tablet Take 1 tablet (25 mg total) by mouth daily. 90 tablet 3   OVER THE COUNTER MEDICATION Take 1 Dose by mouth at bedtime. Sound Asleep Gummy     oxyCODONE -acetaminophen  (PERCOCET/ROXICET) 5-325 MG tablet Take 1 tablet by mouth 5 (five) times daily as needed for pain. (Patient taking differently: Take 1 tablet by mouth in the morning, at noon, and at bedtime.) 150 tablet 0   prasugrel  (EFFIENT ) 10 MG TABS tablet Take 1 tablet (10 mg total) by mouth daily. 90 tablet 3   rosuvastatin  (CRESTOR ) 20 MG tablet Take 1 tablet (20 mg total) by mouth daily. 90 tablet 3   Tiotropium Bromide  Monohydrate (SPIRIVA  RESPIMAT) 1.25 MCG/ACT AERS Inhale 2 puffs into the lungs daily. 4 g 6   Vitamin D -Vitamin K (D3 + K2 PO) Take 1 tablet by mouth daily.     amLODipine  (NORVASC ) 10 MG tablet Take 1 tablet (10 mg total) by mouth daily. (Patient not taking: Reported on 12/03/2023) 90 tablet 3   levothyroxine  (SYNTHROID ) 100 MCG tablet Take 1 tablet (100 mcg total) by mouth every morning on an empty stomach. (Patient not taking: Reported on 12/03/2023) 90 tablet 1   nitroGLYCERIN  (NITROSTAT )  0.4 MG SL tablet Place 1 tablet (0.4 mg total) under the tongue every 5 (five) minutes as needed for chest pain. 25 tablet 3   valACYclovir  (VALTREX ) 1000 MG tablet Take 1,000 mg by mouth 2 (two) times daily as needed (Flares).  2    No results found for this or any previous visit (from the past 48 hours). No results found.  Review of Systems  Musculoskeletal:  Positive for neck pain.  Neurological:  Positive for numbness.    Blood pressure (!) 106/57, pulse 68, temperature 98.5 F (36.9 C), resp. rate 18, height 5' 3 (1.6 m), weight 64.9 kg, SpO2 94%. Physical Exam HENT:  Head: Normocephalic.     Right Ear: Tympanic membrane normal.     Nose: Nose normal.  Cardiovascular:     Rate and Rhythm: Normal rate.     Pulses: Normal pulses.  Pulmonary:     Effort: Pulmonary effort is normal.  Musculoskeletal:     Cervical back: Normal range of motion.  Neurological:     Mental Status: She is alert.     Comments: Strength is 5 out of 5 deltoid, bicep, tricep, wrist flexion, wrist extension, hand intrinsics.      Assessment/Plan 78 year old presents for ACDF C3-4, C4-5, C5-6.  Arley SHAUNNA Helling, MD 12/11/2023, 7:28 AM

## 2023-12-11 NOTE — Plan of Care (Signed)

## 2023-12-11 NOTE — Discharge Instructions (Signed)
  Wound Care Keep incision covered and dry until post op day 3. You may remove the Honeycomb dressing on post op day 3. Leave steri-strips on neck.  They will fall off by themselves. Do not put any creams, lotions, or ointments on incision. You are fine to shower. Let water  run over incision and pat dry.  Activity Walk each and every day, increasing distance each day. No lifting greater than 8 lbs.  Avoid excessive neck motion. No driving, or riding a car unless coming back and forth to see the doctor  Diet Resume your normal diet.    Call Your Doctor If Any of These Occur Redness, drainage, or swelling at the wound.  Temperature greater than 101 degrees. Severe pain not relieved by pain medication. Incision starts to come apart.  Follow Up Appt Call [phone] if you have one or any problem.

## 2023-12-11 NOTE — Progress Notes (Signed)
 Orthopedic Tech Progress Note Patient Details:  Amanda Kim [facility] 1946/01/28 [id number]  Ortho Devices Type of Ortho Device: Soft collar Ortho Device/Splint Location: Neck Ortho Device/Splint Interventions: Ordered, Application   Post Interventions Patient Tolerated: Well  Amanda Kim 12/11/2023, 1:22 PM

## 2023-12-11 NOTE — Transfer of Care (Signed)
 Immediate Anesthesia Transfer of Care Note  Patient: Amanda Kim  Procedure(s) Performed: ANTERIOR CERVICAL DECOMPRESSION/DISCECTOMY FUSION, CERVICAL THREE -CERVICAL FOUR - CERVICAL FOUR -CERVICAL FIVE - CERVICAL FIVE- CERVICAL SIX (Neck)  Patient Location: PACU  Anesthesia Type:General  Level of Consciousness: awake, alert , and sedated  Airway & Oxygen  Therapy: Patient Spontanous Breathing and Patient connected to face mask oxygen   Post-op Assessment: Report given to RN and Post -op Vital signs reviewed and stable  Post vital signs: Reviewed and stable  Last Vitals:  Vitals Value Taken Time  BP 148/80 12/11/23 11:05  Temp    Pulse 79 12/11/23 11:10  Resp 22 12/11/23 11:10  SpO2 92 % 12/11/23 11:10  Vitals shown include unfiled device data.  Last Pain:  Vitals:   12/11/23 0658  PainSc: 6       Patients Stated Pain Goal: 1 (12/11/23 9341)  Complications: No notable events documented.

## 2023-12-11 NOTE — Anesthesia Procedure Notes (Signed)
 Procedure Name: Intubation Date/Time: 12/11/2023 8:10 AM  Performed by: Tationa Stech C, CRNAPre-anesthesia Checklist: Patient identified, Emergency Drugs available, Suction available and Patient being monitored Patient Re-evaluated:Patient Re-evaluated prior to induction Oxygen  Delivery Method: Circle system utilized Preoxygenation: Pre-oxygenation with 100% oxygen  Induction Type: IV induction Ventilation: Mask ventilation without difficulty Laryngoscope Size: Mac and 3 Grade View: Grade II Tube type: Oral Tube size: 7.0 mm Number of attempts: 1 Airway Equipment and Method: Stylet and Oral airway Placement Confirmation: ETT inserted through vocal cords under direct vision, positive ETCO2 and breath sounds checked- equal and bilateral Secured at: 21 cm Tube secured with: Tape Dental Injury: Teeth and Oropharynx as per pre-operative assessment  Comments: Dr. Leopoldo performed intubation.

## 2023-12-12 ENCOUNTER — Other Ambulatory Visit: Payer: Self-pay

## 2023-12-12 ENCOUNTER — Other Ambulatory Visit (HOSPITAL_COMMUNITY): Payer: Self-pay

## 2023-12-12 ENCOUNTER — Encounter (HOSPITAL_COMMUNITY): Payer: Self-pay

## 2023-12-12 MED ORDER — TIZANIDINE HCL 4 MG PO TABS: 4.0000 mg | ORAL_TABLET | Freq: Four times a day (QID) | ORAL | 0 refills | Status: AC | PRN

## 2023-12-12 MED ORDER — TIZANIDINE HCL 4 MG PO TABS
4.0000 mg | ORAL_TABLET | Freq: Four times a day (QID) | ORAL | 0 refills | Status: DC | PRN
  Filled 2023-12-12: qty 60, 15d supply, fill #0

## 2023-12-12 NOTE — Progress Notes (Signed)
Patient is discharged from room 3C04 at this time. Alert and in stable condition. IV site d/c'd and instructions read to patient and spouse with understanding verbalized and all questions answered. Left unit via wheelchair with all belongings at side. 

## 2023-12-12 NOTE — Evaluation (Signed)
 Occupational Therapy Evaluation Patient Details Name: Amanda Kim MRN: [id number] DOB: [birth year] Today's Date: 12/12/2023   History of Present Illness   78 year old female progressive worsening neck pain right shoulder and arm pain workup revealed severe cord compression spinal stenosis and foraminal stenosis at C3-4, C4-5, and C5-6. Pt underwent anterior cervical discectomies and fusion and C3-4 C4-5 C5-6 utilizing allograft and anterior cervical plating with Atlantis plating via Dr. Onetha on 12/11/23.  PMH migraines, back pain, HTN, MI.     Clinical Impressions Pt currently modified independent to distant supervision for simulated selfcare tasks and mobility.  She has history of balance deficits so recommend initial use of cane for safety in and out of the house secondary to history of falls the past 6 months.  Provided education and handout on cervical precautions and patient has all needed DME already.  No post acute OT or PT needs at this time.  Spouse will be available to provide supervision if needed post discharge.      Functional Status Assessment   Patient has not had a recent decline in their functional status     Equipment Recommendations   None recommended by OT      Precautions/Restrictions   Precautions Precautions: Cervical Precaution Booklet Issued: Yes (comment) Recall of Precautions/Restrictions: Intact Required Braces or Orthoses: Cervical Brace Cervical Brace: Soft collar Restrictions Other Position/Activity Restrictions: Soft collar can be removed in bed, transfers to the bathroom, or for showering     Mobility Bed Mobility Overal bed mobility: Modified Independent                  Transfers Overall transfer level: Modified independent Equipment used: None                      Balance Overall balance assessment: Mild deficits observed, not formally tested                                          ADL either performed or assessed with clinical judgement   ADL Overall ADL's : Modified independent                                       General ADL Comments: Pt modified independent for selfcare, mobility, and transfers during session.  History of balance impairments in the past.  Recommend use of cane at home for safety initially and to not walk her dog until she follow's up with her MD post op. Discussed use of shower seat for bathing which she already has so she can sit to wash her hair and feet.  She is able to cross her LEs up in sitting for donning LB clothing but has reachers throughout the home if needed.  Provided cervical precautions handout and reviewed.  Spouse will be available to provide some supervision if needed.     Vision Baseline Vision/History: 1 Wears glasses Ability to See in Adequate Light: 0 Adequate Patient Visual Report: No change from baseline Vision Assessment?: No apparent visual deficits     Perception Perception: Within Functional Limits       Praxis Praxis: WFL       Pertinent Vitals/Pain Pain Assessment Pain Assessment: 0-10 Pain Score: 7  Pain Location: right neck/shoulder area Pain Descriptors / Indicators: Aching, Discomfort Pain  Intervention(s): Monitored during session, Repositioned     Extremity/Trunk Assessment Upper Extremity Assessment Upper Extremity Assessment: Overall WFL for tasks assessed (Noted arthritic changes in hands with grip 3+/5,  AROM WFLs for all other joints but strength not formally assessed secondary to cervical precautions)   Lower Extremity Assessment Lower Extremity Assessment: Overall WFL for tasks assessed   Cervical / Trunk Assessment Cervical / Trunk Assessment: Neck Surgery (soft collar in place)   Communication Communication Communication: No apparent difficulties   Cognition Arousal: Alert Behavior During Therapy: WFL for tasks assessed/performed Cognition: No apparent  impairments                               Following commands: Intact                  Home Living Family/patient expects to be discharged to:: Private residence Living Arrangements: Spouse/significant other Available Help at Discharge: Available 24 hours/day Type of Home: House Home Access: Stairs to enter Secretary/administrator of Steps: 2 Entrance Stairs-Rails: Right Home Layout: One level     Bathroom Shower/Tub: Producer, Television/film/video: Standard Bathroom Accessibility: Yes   Home Equipment: Shower seat;Grab bars - tub/shower          Prior Functioning/Environment Prior Level of Function : Independent/Modified Independent                     AM-PAC OT 6 Clicks Daily Activity     Outcome Measure Help from another person eating meals?: None Help from another person taking care of personal grooming?: None Help from another person toileting, which includes using toliet, bedpan, or urinal?: None Help from another person bathing (including washing, rinsing, drying)?: None Help from another person to put on and taking off regular upper body clothing?: None Help from another person to put on and taking off regular lower body clothing?: None 6 Click Score: 24   End of Session Nurse Communication: Mobility status  Activity Tolerance: Patient tolerated treatment well Patient left: in bed;with call bell/phone within reach                   Time: 9086-9058 OT Time Calculation (min): 28 min Charges:  OT Evaluation $OT Eval Low Complexity: 1 Low OT Treatments $Self Care/Home Management : 8-22 mins  Lynwood Constant, OTR/L Acute Rehabilitation Services  Office [phone] 12/12/2023

## 2023-12-12 NOTE — Discharge Summary (Signed)
 Physician Discharge Summary  Patient ID: Amanda Kim MRN: [id number] DOB/AGE: November 22, 1945 78 y.o.  Admit date: 12/11/2023 Discharge date: 12/12/2023  Admission Diagnoses:Cervical stenosis with radiculopathy C3-6  Discharge Diagnoses: same Principal Problem:   Spinal stenosis of cervical region with radiculopathy   Discharged Condition: good  Hospital Course: Amanda Kim was admitted and taken to the operating room for an uncomplicated ACDF at C3-6. Atlantis hardware was used. Post op she is speaking with a clear, strong voice. Her wound is clean, dry, and without signs of infection. She has excellent strength in the upper extremities. She is also ambulating well. Amanda Kim is tolerating a regular diet.   Treatments: surgery: Anterior cervical discectomies and fusion and C3-4 C4-5 C5-6 utilizing allograft and anterior cervical plating with Atlantis plating system.  Discharge Exam: Blood pressure (!) 142/63, pulse 83, temperature 98.6 F (37 C), temperature source Oral, resp. rate 19, height 5' 3 (1.6 m), weight 64.9 kg, SpO2 97%. General appearance: alert, cooperative, appears stated age, and mild distress  Disposition: Discharge disposition: 01-Home or Self Care      Cervicalgia  Allergies as of 12/12/2023       Reactions   Sulfisoxazole Itching   Bee Venom Itching, Swelling, Rash   Penicillins Itching, Swelling        Medication List     TAKE these medications    acetaminophen  500 MG tablet Commonly known as: TYLENOL  Take 500-1,000 mg by mouth every 6 (six) hours as needed (pain.).   amLODipine  10 MG tablet Commonly known as: NORVASC  Take 1 tablet (10 mg total) by mouth daily.   Aspirin  Low Dose 81 MG tablet Generic drug: aspirin  EC Take 1 tablet (81 mg total) by mouth daily. Swallow whole.   D3 + K2 PO Take 1 tablet by mouth daily.   DULoxetine  60 MG capsule Commonly known as: CYMBALTA  Take 1 capsule (60 mg total) by mouth daily.    hydrALAZINE  50 MG tablet Commonly known as: APRESOLINE  Take 1 tablet (50 mg total) by mouth 3 (three) times daily.   hydrOXYzine  25 MG tablet Commonly known as: ATARAX  Take 25 mg by mouth 3 (three) times daily as needed for anxiety (or sleep).   isosorbide  dinitrate 30 MG tablet Commonly known as: ISORDIL  Take 1 tablet (30 mg total) by mouth 3 (three) times daily.   levothyroxine  100 MCG tablet Commonly known as: SYNTHROID  Take 1 tablet (100 mcg total) by mouth in the morning on an empty stomach.   levothyroxine  100 MCG tablet Commonly known as: SYNTHROID  Take 1 tablet (100 mcg total) by mouth every morning on an empty stomach.   lidocaine  5 % Commonly known as: LIDODERM  Apply 1 patch to skin per package directions, leave on most painful area for up to 12 hours. Max 1 patch daily What changed:  when to take this reasons to take this   losartan  100 MG tablet Commonly known as: COZAAR  Take 1 tablet (100 mg total) by mouth daily.   metoprolol  succinate 25 MG 24 hr tablet Commonly known as: Toprol  XL Take 1 tablet (25 mg total) by mouth daily.   nitroGLYCERIN  0.4 MG SL tablet Commonly known as: NITROSTAT  Place 1 tablet (0.4 mg total) under the tongue every 5 (five) minutes as needed for chest pain.   OVER THE COUNTER MEDICATION Take 1 Dose by mouth at bedtime. Sound Asleep Gummy   oxyCODONE -acetaminophen  5-325 MG tablet Commonly known as: PERCOCET/ROXICET Take 1 tablet by mouth 5 (five) times daily as needed for  pain. What changed: when to take this   prasugrel  10 MG Tabs tablet Commonly known as: EFFIENT  Take 1 tablet (10 mg total) by mouth daily.   rosuvastatin  20 MG tablet Commonly known as: Crestor  Take 1 tablet (20 mg total) by mouth daily.   Spiriva  Respimat 1.25 MCG/ACT Aers Generic drug: Tiotropium Bromide  Inhale 2 puffs into the lungs daily.   tiZANidine  4 MG tablet Commonly known as: ZANAFLEX  Take 1 tablet (4 mg total) by mouth every 6 (six) hours  as needed for muscle spasms.   Vagifem  10 MCG Tabs vaginal tablet Generic drug: Estradiol  Place 10 mcg vaginally 2 (two) times a week.   valACYclovir  1000 MG tablet Commonly known as: VALTREX  Take 1,000 mg by mouth 2 (two) times daily as needed (Flares).   Voltaren  Arthritis Pain 1 % Gel Generic drug: diclofenac  Sodium Apply 1 Application topically 4 (four) times daily as needed (pain.).        Follow-up Information     Onetha Kuba, MD. Call.   Specialty: Neurosurgery Why: As needed, If symptoms worsen Contact information: [address] [place] [address] [phone]                 Signed: Rockey Peru 12/12/2023, 10:51 AM

## 2023-12-14 ENCOUNTER — Telehealth: Payer: Self-pay

## 2023-12-14 ENCOUNTER — Encounter (HOSPITAL_COMMUNITY): Payer: Self-pay | Admitting: Neurosurgery

## 2023-12-14 ENCOUNTER — Other Ambulatory Visit (HOSPITAL_COMMUNITY): Payer: Self-pay

## 2023-12-14 MED FILL — Thrombin For Soln 5000 Unit: CUTANEOUS | Qty: 5000 | Status: AC

## 2023-12-14 NOTE — Transitions of Care (Post Inpatient/ED Visit) (Signed)
   12/14/2023  Name: Amanda Kim MRN: [id number] DOB: [birth year]  Today's TOC FU Call Status: Today's TOC FU Call Status:: Unsuccessful Call (1st Attempt) Unsuccessful Call (1st Attempt) Date: 12/14/23  Attempted to reach the patient regarding the most recent Inpatient/ED visit.  Follow Up Plan: Additional outreach attempts will be made to reach the patient to complete the Transitions of Care (Post Inpatient/ED visit) call.   Wanette Robison J. Alecea Trego RN, MSN [facility], [facility] RN Care Manager Direct Dial: [phone]  Fax: [phone] Website: [email or web address]

## 2023-12-15 ENCOUNTER — Other Ambulatory Visit: Payer: Self-pay

## 2023-12-15 ENCOUNTER — Telehealth: Payer: Self-pay

## 2023-12-15 DIAGNOSIS — Z1231 Encounter for screening mammogram for malignant neoplasm of breast: Secondary | ICD-10-CM

## 2023-12-15 NOTE — Anesthesia Postprocedure Evaluation (Signed)
 Anesthesia Post Note  Patient: Amanda Kim  Procedure(s) Performed: ANTERIOR CERVICAL DECOMPRESSION/DISCECTOMY FUSION, CERVICAL THREE -CERVICAL FOUR - CERVICAL FOUR -CERVICAL FIVE - CERVICAL FIVE- CERVICAL SIX (Neck)     Patient location during evaluation: PACU Anesthesia Type: General Level of consciousness: awake and alert Pain management: pain level controlled Vital Signs Assessment: post-procedure vital signs reviewed and stable Respiratory status: spontaneous breathing, nonlabored ventilation and respiratory function stable Cardiovascular status: blood pressure returned to baseline and stable Postop Assessment: no apparent nausea or vomiting Anesthetic complications: no   No notable events documented.                  Arzu Mcgaughey

## 2023-12-15 NOTE — Transitions of Care (Post Inpatient/ED Visit) (Signed)
   12/15/2023  Name: Avianah Pellman MRN: [id number] DOB: 08/06/1945  Today's TOC FU Call Status: Today's TOC FU Call Status:: Unsuccessful Call (2nd Attempt) Unsuccessful Call (2nd Attempt) Date: 12/15/23  Attempted to reach the patient regarding the most recent Inpatient/ED visit.  Follow Up Plan: Additional outreach attempts will be made to reach the patient to complete the Transitions of Care (Post Inpatient/ED visit) call.   Adelynn Gipe J. Keeghan Mcintire RN, MSN [facility], [facility] Health RN Care Manager Direct Dial: [phone]  Fax: [phone] Website: [email or web address]

## 2023-12-16 ENCOUNTER — Telehealth: Payer: Self-pay

## 2023-12-16 ENCOUNTER — Other Ambulatory Visit: Payer: Self-pay | Admitting: Cardiology

## 2023-12-16 ENCOUNTER — Other Ambulatory Visit (HOSPITAL_COMMUNITY): Payer: Self-pay

## 2023-12-16 ENCOUNTER — Encounter (HOSPITAL_COMMUNITY): Payer: Self-pay

## 2023-12-16 ENCOUNTER — Encounter: Payer: Self-pay | Admitting: Cardiology

## 2023-12-16 DIAGNOSIS — I25118 Atherosclerotic heart disease of native coronary artery with other forms of angina pectoris: Secondary | ICD-10-CM

## 2023-12-16 MED ORDER — CLOPIDOGREL BISULFATE 75 MG PO TABS
75.0000 mg | ORAL_TABLET | Freq: Every day | ORAL | 3 refills | Status: AC
  Filled 2023-12-16 – 2024-03-05 (×2): qty 90, 90d supply, fill #0

## 2023-12-16 NOTE — Transitions of Care (Post Inpatient/ED Visit) (Signed)
 12/16/2023  Name: Amanda Kim MRN: [id number] DOB: Dec 18, 1945  Today's TOC FU Call Status: Today's TOC FU Call Status:: Successful TOC FU Call Completed TOC FU Call Complete Date: 12/16/23 Patient's Name and Date of Birth confirmed.  Transition Care Management Follow-up Telephone Call Date of Discharge: 12/12/23 Discharge Facility: [facility]) Type of Discharge: Inpatient Admission Primary Inpatient Discharge Diagnosis:: Spinal stenosis of cervical region with radiculopathy How have you been since you were released from the hospital?: Better Any questions or concerns?: No  Items Reviewed: Did you receive and understand the discharge instructions provided?: No Medications obtained,verified, and reconciled?: Yes (Medications Reviewed) Any new allergies since your discharge?: No Dietary orders reviewed?: Yes Type of Diet Ordered:: Low sodium heart healthy Do you have support at home?: Yes People in Home [RPT]: significant other Name of Support/Comfort Primary Source: Chuck  Medications Reviewed Today: Medications Reviewed Today     Reviewed by Jayelle Page, RN (Case Manager) on 12/16/23 at 1101  Med List Status: <None>   Medication Order Taking? Sig Documenting Provider Last Dose Status Informant  acetaminophen  (TYLENOL ) 500 MG tablet [id number] Yes Take 500-1,000 mg by mouth every 6 (six) hours as needed (pain.). [provider]  Active Self  amLODipine  (NORVASC ) 10 MG tablet [id number]  Take 1 tablet (10 mg total) by mouth daily.  Patient not taking: Reported on 12/03/2023     Active Self  clopidogrel (PLAVIX) 75 MG tablet [id number] Yes Take 1 tablet (75 mg total) by mouth daily. Ladona Heinz, MD  Active   diclofenac  Sodium (VOLTAREN  ARTHRITIS PAIN) 1 % GEL [id number]  Apply 1 Application topically 4 (four) times daily as needed (pain.). [provider]  Active Self  DULoxetine  (CYMBALTA ) 60 MG capsule [id number] Yes Take 1 capsule (60 mg total) by  mouth daily.   Active Self  Estradiol  (VAGIFEM ) 10 MCG TABS vaginal tablet [id number] Yes Place 10 mcg vaginally 2 (two) times a week. [provider]  Active Self  hydrALAZINE  (APRESOLINE ) 50 MG tablet [id number] Yes Take 1 tablet (50 mg total) by mouth 3 (three) times daily. Ladona Heinz, MD  Active   hydrOXYzine  (ATARAX ) 25 MG tablet [id number] Yes Take 25 mg by mouth 3 (three) times daily as needed for anxiety (or sleep). [provider]  Active Self  isosorbide  dinitrate (ISORDIL ) 30 MG tablet [id number] Yes Take 1 tablet (30 mg total) by mouth 3 (three) times daily. Ladona Heinz, MD  Active   levothyroxine  (SYNTHROID ) 100 MCG tablet [id number] Yes Take 1 tablet (100 mcg total) by mouth in the morning on an empty stomach.   Active Self  levothyroxine  (SYNTHROID ) 100 MCG tablet [id number]  Take 1 tablet (100 mcg total) by mouth every morning on an empty stomach.  Patient not taking: Reported on 12/03/2023     Active Self  lidocaine  (LIDODERM ) 5 % [id number] Yes Apply 1 patch to skin per package directions, leave on most painful area for up to 12 hours. Max 1 patch daily  Patient taking differently: Place 1 patch onto the skin daily as needed (pain.).     Active Self  losartan  (COZAAR ) 100 MG tablet [id number] Yes Take 1 tablet (100 mg total) by mouth daily.   Active Self  metoprolol  succinate (TOPROL  XL) 25 MG 24 hr tablet [id number] Yes Take 1 tablet (25 mg total) by mouth daily. Ladona Heinz, MD  Active Self  nitroGLYCERIN  (NITROSTAT ) 0.4 MG SL tablet [id number] Yes Place 1 tablet (0.4 mg total) under the tongue every  5 (five) minutes as needed for chest pain. Ladona Heinz, MD  Active Self  OVER THE COUNTER MEDICATION [id number] Yes Take 1 Dose by mouth at bedtime. Sound Asleep Gummy [provider]  Active Self, Pharmacy Records  oxyCODONE -acetaminophen  (PERCOCET/ROXICET) 5-325 MG tablet [id number] Yes Take 1 tablet by mouth 5 (five) times daily as needed for pain.  Patient taking  differently: Take 1 tablet by mouth every 4 (four) hours as needed.     Active Self  rosuvastatin  (CRESTOR ) 20 MG tablet [id number]  Take 1 tablet (20 mg total) by mouth daily. Madie Jon Garre, PA  Active Self  Tiotropium Bromide  Monohydrate (SPIRIVA  RESPIMAT) 1.25 MCG/ACT AERS [id number] Yes Inhale 2 puffs into the lungs daily. Geronimo Amel, MD  Active Self  tiZANidine  (ZANAFLEX ) 4 MG tablet [id number]  Take 1 tablet (4 mg total) by mouth every 6 (six) hours as needed for muscle spasms. Gillie Duncans, MD  Active   tiZANidine  (ZANAFLEX ) 4 MG tablet [id number] Yes Take 1 tablet (4 mg total) by mouth every 6 (six) hours as needed for muscle spasms Gillie Duncans, MD  Active   valACYclovir  (VALTREX ) 1000 MG tablet [id number] Yes Take 1,000 mg by mouth 2 (two) times daily as needed (Flares). [provider]  Active Self, Pharmacy Records           Med Note CLAUD, MICHEAL ONEIDA Schaumann Dec 03, 2023 12:18 PM)    Vitamin D -Vitamin K (D3 + K2 PO) [id number] Yes Take 1 tablet by mouth daily. [provider]  Active Self            Home Care and Equipment/Supplies: Were Home Health Services Ordered?: NA Any new equipment or medical supplies ordered?: NA  Functional Questionnaire: Do you need assistance with bathing/showering or dressing?: No Do you need assistance with meal preparation?: No Do you need assistance with eating?: No Do you have difficulty maintaining continence: No Do you need assistance with getting out of bed/getting out of a chair/moving?: No Do you have difficulty managing or taking your medications?: No  Follow up appointments reviewed: PCP Follow-up appointment confirmed?: No (Patient to call PCP) MD Provider Line [phone] Given: No Specialist Hospital Follow-up appointment confirmed?: Yes Date of Specialist follow-up appointment?: 12/24/23 Follow-Up Specialty Provider:: Dr. Onetha Do you need transportation to your follow-up appointment?: No Do  you understand care options if your condition(s) worsen?: Yes-patient verbalized understanding  SDOH Interventions Today    Flowsheet Row Most Recent Value  SDOH Interventions   Food Insecurity Interventions Intervention Not Indicated  Housing Interventions Intervention Not Indicated  Transportation Interventions Intervention Not Indicated, Patient Resources (Friends/Family)  Utilities Interventions Intervention Not Indicated    Emmah Bratcher J. Graydon Fofana RN, MSN [facility]  [facility], [facility] RN Care Manager Direct Dial: [phone]  Fax: [phone] Website: [email or web address]

## 2023-12-16 NOTE — Transitions of Care (Post Inpatient/ED Visit) (Signed)
   12/16/2023  Name: Amanda Kim MRN: [id number] DOB: 1945/10/12  Today's TOC FU Call Status: Today's TOC FU Call Status:: Unsuccessful Call (3rd Attempt) Unsuccessful Call (3rd Attempt) Date: 12/16/23  Attempted to reach the patient regarding the most recent Inpatient/ED visit.  Follow Up Plan: No further outreach attempts will be made at this time. We have been unable to contact the patient.  Honest Safranek J. Zyhir Cappella RN, MSN [facility], [facility] Health RN Care Manager Direct Dial: [phone]  Fax: [phone] Website: [email or web address]

## 2023-12-16 NOTE — Patient Instructions (Addendum)
 Visit Information  Thank you for taking time to visit with me today. Please don't hesitate to contact me if I can be of assistance to you before our next scheduled telephone appointment.  Our next appointment is by telephone on 12/23/23 at 1100 am  Following is a copy of your care plan:   Goals Addressed             This Visit's Progress    VBCI Transitions of Care (TOC) Care Plan       Problems:  Recent Hospitalization for treatment of Spinal stenosis of cervical region with radiculopathy Knowledge Deficit Related to Spinal stenosis of cervical region with radiculopathy/s/p Anterior cervical discectomies and fusion and C3-4 C4-5 C5-6  Goal:  Over the next 30 days, the patient will not experience hospital readmission  Interventions:  Transitions of Care: 12/16/23 Patient reports she is having some pain but taking percocet abut every 4 hours.  She states supportive boyfriend. Incision with some swelling to site with steri strips over incision.  No signs of infection.  She reports that she is letting water run over area when showering.  She is increasing activity.  No issues with constipation.  Follow up with Dr. Onetha 12-24-23.    Doctor Visits  - discussed the importance of doctor visits Post discharge activity limitations prescribed by provider reviewed Post-op wound/incision care reviewed with patient/caregiver Reviewed Signs and symptoms of infection  Patient Self Care Activities:  Attend all scheduled provider appointments Call pharmacy for medication refills 3-7 days in advance of running out of medications Call provider office for new concerns or questions  Notify RN Care Manager of TOC call rescheduling needs Participate in Transition of Care Program/Attend TOC scheduled calls Take medications as prescribed   Monitor incision for Redness, drainage, or swelling Leave steri-strips on neck. They will fall off by themselves. Do not put any creams, lotions, or ointments on  incision. You are fine to shower. Let water run over incision and pat dry. Walk each and every day, increasing distance each day. No lifting greater than 8 lbs. Avoid excessive neck motion. No driving, or riding a car unless coming back and forth to see the doctor Call Your Doctor if these occur Redness, drainage, or swelling at the wound. Temperature greater than 101 degrees. Severe pain not relieved by pain medication. Incision starts to come apart.  Plan:  The patient has been provided with contact information for the care management team and has been advised to call with any health related questions or concerns.         Patient verbalizes understanding of instructions and care plan provided today and agrees to view in MyChart. Active MyChart status and patient understanding of how to access instructions and care plan via MyChart confirmed with patient.     The patient has been provided with contact information for the care management team and has been advised to call with any health related questions or concerns.   Please call the care guide team at [phone] if you need to cancel or reschedule your appointment.   Please call the Suicide and Crisis Lifeline: 988 if you are experiencing a Mental Health or Behavioral Health Crisis or need someone to talk to.  Minta Fair J. Kurt Azimi RN, MSN [facility], [facility] Health RN Care Manager Direct Dial: [phone]  Fax: [phone] Website: [email or web address]

## 2023-12-17 ENCOUNTER — Encounter (HOSPITAL_COMMUNITY): Payer: Self-pay

## 2023-12-17 ENCOUNTER — Other Ambulatory Visit (HOSPITAL_COMMUNITY): Payer: Self-pay

## 2023-12-18 ENCOUNTER — Other Ambulatory Visit (HOSPITAL_BASED_OUTPATIENT_CLINIC_OR_DEPARTMENT_OTHER): Payer: Self-pay

## 2023-12-18 ENCOUNTER — Other Ambulatory Visit (HOSPITAL_COMMUNITY): Payer: Self-pay

## 2023-12-18 ENCOUNTER — Other Ambulatory Visit: Payer: Self-pay

## 2023-12-18 ENCOUNTER — Emergency Department (HOSPITAL_BASED_OUTPATIENT_CLINIC_OR_DEPARTMENT_OTHER)

## 2023-12-18 ENCOUNTER — Emergency Department (HOSPITAL_BASED_OUTPATIENT_CLINIC_OR_DEPARTMENT_OTHER)
Admission: EM | Admit: 2023-12-18 | Discharge: 2023-12-18 | Disposition: A | Discharge status: Home / Self Care | Attending: Emergency Medicine | Admitting: Emergency Medicine

## 2023-12-18 DIAGNOSIS — R9431 Abnormal electrocardiogram [ECG] [EKG]: Secondary | ICD-10-CM | POA: Diagnosis not present

## 2023-12-18 DIAGNOSIS — M2578 Osteophyte, vertebrae: Secondary | ICD-10-CM | POA: Diagnosis not present

## 2023-12-18 DIAGNOSIS — M5410 Radiculopathy, site unspecified: Secondary | ICD-10-CM

## 2023-12-18 DIAGNOSIS — M5030 Other cervical disc degeneration, unspecified cervical region: Secondary | ICD-10-CM | POA: Diagnosis not present

## 2023-12-18 DIAGNOSIS — M5412 Radiculopathy, cervical region: Secondary | ICD-10-CM | POA: Insufficient documentation

## 2023-12-18 DIAGNOSIS — M542 Cervicalgia: Secondary | ICD-10-CM | POA: Diagnosis present

## 2023-12-18 DIAGNOSIS — Z981 Arthrodesis status: Secondary | ICD-10-CM | POA: Diagnosis not present

## 2023-12-18 DIAGNOSIS — I10 Essential (primary) hypertension: Secondary | ICD-10-CM | POA: Diagnosis not present

## 2023-12-18 DIAGNOSIS — M5416 Radiculopathy, lumbar region: Secondary | ICD-10-CM | POA: Diagnosis not present

## 2023-12-18 DIAGNOSIS — R221 Localized swelling, mass and lump, neck: Secondary | ICD-10-CM | POA: Diagnosis not present

## 2023-12-18 LAB — COMPREHENSIVE METABOLIC PANEL WITH GFR
ALT: 18 U/L (ref 0–44)
AST: 22 U/L (ref 15–41)
Albumin: 4.4 g/dL (ref 3.5–5.0)
Alkaline Phosphatase: 76 U/L (ref 38–126)
Anion gap: 10 (ref 5–15)
BUN: 16 mg/dL (ref 8–23)
CO2: 25 mmol/L (ref 22–32)
Calcium: 9.8 mg/dL (ref 8.9–10.3)
Chloride: 103 mmol/L (ref 98–111)
Creatinine, Ser: 0.79 mg/dL (ref 0.44–1.00)
GFR, Estimated: >60 mL/min (ref >60)
Glucose, Bld: 141 mg/dL — ABNORMAL HIGH (ref 70–99)
Potassium: 4.2 mmol/L (ref 3.5–5.1)
Sodium: 139 mmol/L (ref 135–145)
Total Bilirubin: 0.3 mg/dL (ref 0.0–1.2)
Total Protein: 7.6 g/dL (ref 6.5–8.1)

## 2023-12-18 LAB — CBC WITH DIFFERENTIAL/PLATELET
Abs Immature Granulocytes: 0.07 K/uL (ref 0.00–0.07)
Basophils Absolute: 0 K/uL (ref 0.0–0.1)
Basophils Relative: 0 %
Eosinophils Absolute: 0 K/uL (ref 0.0–0.5)
Eosinophils Relative: 0 %
HCT: 39.8 % (ref 36.0–46.0)
Hemoglobin: 13 g/dL (ref 12.0–15.0)
Immature Granulocytes: 1 %
Lymphocytes Relative: 11 %
Lymphs Abs: 1 K/uL (ref 0.7–4.0)
MCH: 30.4 pg (ref 26.0–34.0)
MCHC: 32.7 g/dL (ref 30.0–36.0)
MCV: 93 fL (ref 80.0–100.0)
Monocytes Absolute: 0.2 K/uL (ref 0.1–1.0)
Monocytes Relative: 2 %
Neutro Abs: 8 K/uL — ABNORMAL HIGH (ref 1.7–7.7)
Neutrophils Relative %: 86 %
Platelets: 359 K/uL (ref 150–400)
RBC: 4.28 MIL/uL (ref 3.87–5.11)
RDW: 12.1 % (ref 11.5–15.5)
WBC: 9.3 K/uL (ref 4.0–10.5)
nRBC: 0 % (ref 0.0–0.2)

## 2023-12-18 LAB — TROPONIN T, HIGH SENSITIVITY: Troponin T High Sensitivity: <15 ng/L (ref 0–19)

## 2023-12-18 MED ORDER — HYDROMORPHONE HCL 1 MG/ML IJ SOLN
1.0000 mg | Freq: Once | INTRAMUSCULAR | Status: AC
  Administered 2023-12-18: 1 mg via INTRAVENOUS
  Filled 2023-12-18: qty 1

## 2023-12-18 MED ORDER — IOHEXOL 300 MG/ML  SOLN
75.0000 mL | Freq: Once | INTRAMUSCULAR | Status: AC | PRN
  Administered 2023-12-18: 75 mL via INTRAVENOUS

## 2023-12-18 MED ORDER — OXYCODONE HCL 10 MG PO TABS
10.0000 mg | ORAL_TABLET | Freq: Four times a day (QID) | ORAL | 0 refills | Status: AC | PRN
  Filled 2023-12-18: qty 20, 5d supply, fill #0

## 2023-12-18 MED ORDER — METHYLPREDNISOLONE 4 MG PO TBPK
ORAL_TABLET | ORAL | 0 refills | Status: DC
  Filled 2023-12-18: qty 21, 6d supply, fill #0

## 2023-12-18 MED ORDER — ONDANSETRON HCL 4 MG/2ML IJ SOLN
4.0000 mg | Freq: Once | INTRAMUSCULAR | Status: AC
  Administered 2023-12-18: 4 mg via INTRAVENOUS
  Filled 2023-12-18: qty 2

## 2023-12-18 NOTE — ED Provider Notes (Addendum)
 [place] EMERGENCY DEPARTMENT AT [facility] Provider Note   CSN: [id number] Arrival date & time: 12/18/23  1620     Patient presents with: Neck Pain   Amanda Kim is a 78 y.o. female.   Patient status post cervical stenosis with radiculopathy C3 C6 status post surgery with anterior cervical discectomies and fusion of C3-4 C4-5 C5-6 utilizing allograft and anterior cervical plating with Atlantis plating system.  Patient was admitted October 24 discharged October 25.  Patient has had increased pain.  Despite being followed by pain management.  Pain management Center here for evaluation.  Patient has not touch base with neurosurgery of late.  Patient denies any fevers temperature 98.2 pulse 71 respirations 19 blood pressure 155/77 oxygen  sats 97% on room air.  Patient does have a spinal stimulator in place.  That apparently is MRI compatible.  Patient is complaining of unchanged pain although more intense to her right upper extremity.  And pain around the neck.       Prior to Admission medications   Medication Sig Start Date End Date Taking? Authorizing Provider  acetaminophen  (TYLENOL ) 500 MG tablet Take 500-1,000 mg by mouth every 6 (six) hours as needed (pain.).    [provider]  amLODipine  (NORVASC ) 10 MG tablet Take 1 tablet (10 mg total) by mouth daily. Patient not taking: Reported on 12/03/2023 01/28/23     clopidogrel (PLAVIX) 75 MG tablet Take 1 tablet (75 mg total) by mouth daily. 12/16/23   Ladona Heinz, MD  diclofenac  Sodium (VOLTAREN  ARTHRITIS PAIN) 1 % GEL Apply 1 Application topically 4 (four) times daily as needed (pain.).    [provider]  DULoxetine  (CYMBALTA ) 60 MG capsule Take 1 capsule (60 mg total) by mouth daily. 09/15/23     Estradiol  (VAGIFEM ) 10 MCG TABS vaginal tablet Place 10 mcg vaginally 2 (two) times a week.    [provider]  hydrALAZINE  (APRESOLINE ) 50 MG tablet Take 1 tablet (50 mg total) by mouth 3  (three) times daily. 12/07/23   Ladona Heinz, MD  hydrOXYzine  (ATARAX ) 25 MG tablet Take 25 mg by mouth 3 (three) times daily as needed for anxiety (or sleep).    [provider]  isosorbide  dinitrate (ISORDIL ) 30 MG tablet Take 1 tablet (30 mg total) by mouth 3 (three) times daily. 12/07/23   Ladona Heinz, MD  levothyroxine  (SYNTHROID ) 100 MCG tablet Take 1 tablet (100 mcg total) by mouth in the morning on an empty stomach. 07/28/23     levothyroxine  (SYNTHROID ) 100 MCG tablet Take 1 tablet (100 mcg total) by mouth every morning on an empty stomach. Patient not taking: Reported on 12/03/2023 10/28/23     lidocaine  (LIDODERM ) 5 % Apply 1 patch to skin per package directions, leave on most painful area for up to 12 hours. Max 1 patch daily Patient taking differently: Place 1 patch onto the skin daily as needed (pain.). 09/24/23     losartan  (COZAAR ) 100 MG tablet Take 1 tablet (100 mg total) by mouth daily. 10/26/23     methylPREDNISolone  (MEDROL  DOSEPAK) 4 MG TBPK tablet Take 6 tablets (24 mg total) by mouth daily for 1 day, THEN 5 tablets (20 mg total) daily for 1 day, THEN 4 tablets (16 mg total) daily for 1 day, THEN 3 tablets (12 mg total) daily for 1 day, THEN 2 tablets (8 mg total) daily for 1 day, THEN 1 tablet (4 mg total) daily for 1 day. 12/18/23 12/24/23    metoprolol  succinate (TOPROL   XL) 25 MG 24 hr tablet Take 1 tablet (25 mg total) by mouth daily. 09/28/23   Ladona Heinz, MD  nitroGLYCERIN  (NITROSTAT ) 0.4 MG SL tablet Place 1 tablet (0.4 mg total) under the tongue every 5 (five) minutes as needed for chest pain. 02/13/23   Ladona Heinz, MD  OVER THE COUNTER MEDICATION Take 1 Dose by mouth at bedtime. Sound Asleep Gummy    [provider]  Oxycodone  HCl 10 MG TABS Take 1 tablet (10 mg total) by mouth every 6 (six) hours as needed for pain. 12/18/23     oxyCODONE -acetaminophen  (PERCOCET/ROXICET) 5-325 MG tablet Take 1 tablet by mouth 5 (five) times daily as needed for pain. Patient  taking differently: Take 1 tablet by mouth every 4 (four) hours as needed. 11/24/23     rosuvastatin  (CRESTOR ) 20 MG tablet Take 1 tablet (20 mg total) by mouth daily. 08/03/23 08/02/24  [facility], Jon Garre, PA  Tiotropium Bromide  Monohydrate (SPIRIVA  RESPIMAT) 1.25 MCG/ACT AERS Inhale 2 puffs into the lungs daily. 10/08/23   Geronimo Amel, MD  tiZANidine  (ZANAFLEX ) 4 MG tablet Take 1 tablet (4 mg total) by mouth every 6 (six) hours as needed for muscle spasms. 12/12/23   Gillie Duncans, MD  tiZANidine  (ZANAFLEX ) 4 MG tablet Take 1 tablet (4 mg total) by mouth every 6 (six) hours as needed for muscle spasms 12/12/23   Gillie Duncans, MD  valACYclovir  (VALTREX ) 1000 MG tablet Take 1,000 mg by mouth 2 (two) times daily as needed (Flares). 08/24/17   [provider]  Vitamin D -Vitamin K (D3 + K2 PO) Take 1 tablet by mouth daily.    [provider]    Allergies: Sulfisoxazole, Bee venom, and Penicillins    Review of Systems  Constitutional:  Negative for chills and fever.  HENT:  Negative for ear pain and sore throat.   Eyes:  Negative for pain and visual disturbance.  Respiratory:  Negative for cough and shortness of breath.   Cardiovascular:  Negative for chest pain and palpitations.  Gastrointestinal:  Negative for abdominal pain and vomiting.  Genitourinary:  Negative for dysuria and hematuria.  Musculoskeletal:  Positive for neck pain. Negative for arthralgias, back pain and neck stiffness.  Skin:  Negative for color change and rash.  Neurological:  Positive for weakness and numbness. Negative for seizures and syncope.  All other systems reviewed and are negative.   Updated Vital Signs BP (!) 166/81   Pulse 74   Temp 98.2 F (36.8 C) (Oral)   Resp 18   Ht 1.6 m (5' 3)   Wt 65.3 kg   SpO2 95%   BMI 25.51 kg/m   Physical Exam Vitals and nursing note reviewed.  Constitutional:      General: She is not in acute distress.    Appearance: She is well-developed.   HENT:     Head: Normocephalic and atraumatic.  Eyes:     Conjunctiva/sclera: Conjunctivae normal.     Pupils: Pupils are equal, round, and reactive to light.  Neck:     Comments: Right-sided anterior neck wound with Steri-Strips in place no surrounding erythema no significant swelling. Cardiovascular:     Rate and Rhythm: Normal rate and regular rhythm.     Heart sounds: No murmur heard. Pulmonary:     Effort: Pulmonary effort is normal. No respiratory distress.     Breath sounds: Normal breath sounds.  Abdominal:     Palpations: Abdomen is soft.     Tenderness: There is no abdominal  tenderness.  Musculoskeletal:        General: No swelling.     Cervical back: Neck supple. No rigidity.  Skin:    General: Skin is warm and dry.     Capillary Refill: Capillary refill takes less than 2 seconds.  Neurological:     Mental Status: She is alert and oriented to person, place, and time.     Sensory: Sensory deficit present.     Motor: Weakness present.  Psychiatric:        Mood and Affect: Mood normal.     (all labs ordered are listed, but only abnormal results are displayed) Labs Reviewed  COMPREHENSIVE METABOLIC PANEL WITH GFR - Abnormal; Notable for the following components:      Result Value   Glucose, Bld 141 (*)    All other components within normal limits  CBC WITH DIFFERENTIAL/PLATELET - Abnormal; Notable for the following components:   Neutro Abs 8.0 (*)    All other components within normal limits  TROPONIN T, HIGH SENSITIVITY    EKG: EKG Interpretation Date/Time:  Friday December 18 2023 16:52:08 EDT Ventricular Rate:  70 PR Interval:  188 QRS Duration:  94 QT Interval:  409 QTC Calculation: 442 R Axis:   -18  Text Interpretation: Sinus rhythm Borderline left axis deviation Low voltage, precordial leads Consider anterior infarct Abnormal T, consider ischemia, inferior leads Artifact in lead(s) I II III aVR aVL aVF V1 V2 No significant change since last tracing  Confirmed by Tyiesha Brackney [phone]) on 12/18/2023 5:02:29 PM  Radiology: No results found.   Procedures   Medications Ordered in the ED - No data to display                                  Medical Decision Making Amount and/or Complexity of Data Reviewed Labs: ordered. Radiology: ordered.  Risk Prescription drug management.   Patient states symptoms are unchanged from the surgery.  But they were that way in the hospital immediately postop.  Patient's cervical wound seems to be healing well.  Troponin less than 15 delta troponin not really required complete metabolic panel normal.  Including LFTs and renal function.  CBC no leukocytosis hemoglobin 13 platelets 359.  EKG unchanged but did have a lot of artifact secondary to the nerve stimulator.  But you can see that it is sinus rhythm.  In reexamination patient really does not have any new sensory deficits maybe some increased weakness.  CT soft tissue neck does show some fluid collection which may be just be postsurgical.  Not sure if it is infectious but with the white count being normal and no fevers probably okay.  There is a fluid collection of 3 x 1.8 cm fluid digits with the prevertebral space at the C3 C6 level.  Will run this by on call neurosurgery.  Discussed with on-call neurosurgery Dr. Gillie he says that these are just postsurgical changes with the fluid collection particular with her white count being normal no fevers.  You can just call the office on Monday for follow-up she will continue her chronic pain medicine and the new pain medicine that her pain management doctor put in for her today.    Final diagnoses:  Radiculopathy, unspecified spinal region    ED Discharge Orders     None          Geraldene Hamilton, MD 12/18/23 1758    Abanoub Hanken,  MD 12/18/23 2019    Geraldene Hamilton, MD 12/18/23 2037

## 2023-12-18 NOTE — ED Triage Notes (Signed)
 Pt POV reporting persistent R side neck, shoulder, and head pain following cervical decompression surgery 10/24, seen by pain clinic with no improvement, told to come to ED if pain persists.

## 2023-12-18 NOTE — Discharge Instructions (Signed)
 I discussed with Dr. Cabbell for on-call neurosurgery.  Thinks everything is postsurgical changes no warning signs at this time.  Give the office a call on Monday for follow-up.  Take your chronic pain medicine as well as your new pain medicine that your pain management doctor provided today.

## 2023-12-18 NOTE — ED Notes (Signed)
 Called lab to add troponin, spoke with Jasmine.

## 2023-12-21 ENCOUNTER — Other Ambulatory Visit (HOSPITAL_BASED_OUTPATIENT_CLINIC_OR_DEPARTMENT_OTHER): Payer: Self-pay

## 2023-12-21 ENCOUNTER — Encounter (HOSPITAL_BASED_OUTPATIENT_CLINIC_OR_DEPARTMENT_OTHER): Payer: Self-pay

## 2023-12-23 ENCOUNTER — Other Ambulatory Visit: Payer: Self-pay

## 2023-12-23 ENCOUNTER — Encounter: Payer: Self-pay | Admitting: Cardiology

## 2023-12-23 DIAGNOSIS — F4323 Adjustment disorder with mixed anxiety and depressed mood: Secondary | ICD-10-CM | POA: Diagnosis not present

## 2023-12-23 NOTE — Patient Instructions (Signed)
 Visit Information  Thank you for taking time to visit with me today. Please don't hesitate to contact me if I can be of assistance to you before our next scheduled telephone appointment.  Our next appointment is by telephone on 12/29/23 at 1100 am  Following is a copy of your care plan:   Goals Addressed             This Visit's Progress    VBCI Transitions of Care (TOC) Care Plan       Problems:  Recent Hospitalization for treatment of Spinal stenosis of cervical region with radiculopathy Knowledge Deficit Related to Spinal stenosis of cervical region with radiculopathy/s/p Anterior cervical discectomies and fusion and C3-4 C4-5 C5-6  Goal:  Over the next 30 days, the patient will not experience hospital readmission  Interventions:  Transitions of Care: 12/23/23 Patient reports she is doing much better but did have an ED visit over the weekend due to pain.  She is taking oxycodone  10mg  as needed.  Pain 6/10 today. She is out walking her dog.  Incision with no signs of infection with steri strips over incision.  No issues with constipation.  Follow up with Dr. Onetha 12-24-23.    Doctor Visits  - discussed the importance of doctor visits Post discharge activity limitations prescribed by provider reviewed Reviewed Signs and symptoms of infection  Patient Self Care Activities:  Attend all scheduled provider appointments Call pharmacy for medication refills 3-7 days in advance of running out of medications Call provider office for new concerns or questions  Notify RN Care Manager of TOC call rescheduling needs Participate in Transition of Care Program/Attend TOC scheduled calls Take medications as prescribed   Monitor incision for Redness, drainage, or swelling Leave steri-strips on neck. They will fall off by themselves. Do not put any creams, lotions, or ointments on incision. You are fine to shower. Let water run over incision and pat dry. Walk each and every day, increasing  distance each day. No lifting greater than 8 lbs. Avoid excessive neck motion. No driving, or riding a car unless coming back and forth to see the doctor Call Your Doctor if these occur Redness, drainage, or swelling at the wound. Temperature greater than 101 degrees. Severe pain not relieved by pain medication. Incision starts to come apart.  Plan:  The patient has been provided with contact information for the care management team and has been advised to call with any health related questions or concerns.         Patient verbalizes understanding of instructions and care plan provided today and agrees to view in MyChart. Active MyChart status and patient understanding of how to access instructions and care plan via MyChart confirmed with patient.     The patient has been provided with contact information for the care management team and has been advised to call with any health related questions or concerns.   Please call the care guide team at [phone] if you need to cancel or reschedule your appointment.   Please call the Suicide and Crisis Lifeline: 988 if you are experiencing a Mental Health or Behavioral Health Crisis or need someone to talk to.  Lymon Kidney J. Sharmin Foulk RN, MSN [facility], [facility] RN Care Manager Direct Dial: [phone]  Fax: [phone] Website: [email or web address]

## 2023-12-23 NOTE — Transitions of Care (Post Inpatient/ED Visit) (Signed)
 Transition of Care week 2  Visit Note  12/23/2023  Name: Amanda Kim MRN: [id number]          DOB: [birth year]  Situation: Patient enrolled in [facility] 30-day program. Visit completed with patient by telephone.   Background:   Initial Transition Care Management Follow-up Telephone Call Discharge Date and Diagnosis: 12/12/23, Spinal stenosis of cervical region with radiculopathy   Past Medical History:  Diagnosis Date   Back pain    d/t horse fell on pt in the 80's   Bone spur    in neck(since 1990) and 1st 3 fingers on right hand go numb   Chronic pain    d/t horse accident in the 80's   Headache(784.0)    tension   History of colon polyps    Hyperlipidemia    takes Fish Oil and CoQ10   Hypertension    takes Inderal  daily   Hypothyroid 05/07/2011   takes Synthroid  daily   Leg cramps    takes Potassium prn   Liver disease    nonalcholic fatty liver disease   Myocardial infarction (HCC)    Nausea & vomiting    phenergan  and zofran  prn   PONV (postoperative nausea and vomiting)    Spondylolisthesis    Spondylosis of thoracolumbar region without myelopathy or radiculopathy    spine   Urinary frequency     Assessment: Patient Reported Symptoms: Cognitive Cognitive Status: Alert and oriented to person, place, and time, Normal speech and language skills      Neurological Neurological Review of Symptoms: Other: Oher Neurological Symptoms/Conditions [RPT]: Spinal Stenosis with recent surgery. Patient reports she is doing well today. Pain 6/10.  MD Follow up 11-6 Neurological Management Strategies: Medication therapy, Routine screening, Activity Neurological Self-Management Outcome: 4 (good)  HEENT HEENT Symptoms Reported: No symptoms reported      Cardiovascular Cardiovascular Symptoms Reported: No symptoms reported    Respiratory Respiratory Symptoms Reported: No symptoms reported    Endocrine Endocrine Symptoms Reported: No symptoms reported     Gastrointestinal Gastrointestinal Symptoms Reported: No symptoms reported      Genitourinary Genitourinary Symptoms Reported: No symptoms reported    Integumentary Integumentary Symptoms Reported: Incision Additional Integumentary Details: Patient reports mid neck incision. Steri strips to area. Follow up 12-24-23. No signs of infection. Skin Management Strategies: Routine screening Skin Self-Management Outcome: 4 (good)  Musculoskeletal Musculoskelatal Symptoms Reviewed: Limited mobility Additional Musculoskeletal Details: Patient moving better.  Out walking her dog today.  Reiterated pacing self with activitiy Musculoskeletal Management Strategies: Activity Musculoskeletal Self-Management Outcome: 4 (good)      Psychosocial Psychosocial Symptoms Reported: No symptoms reported         There were no vitals filed for this visit.  Medications Reviewed Today     Reviewed by Jessicamarie Amiri, RN (Case Manager) on 12/23/23 at 1103  Med List Status: <None>   Medication Order Taking? Sig Documenting Provider Last Dose Status Informant  acetaminophen  (TYLENOL ) 500 MG tablet [id number]  Take 500-1,000 mg by mouth every 6 (six) hours as needed (pain.). [provider]  Active Self  amLODipine  (NORVASC ) 10 MG tablet [id number]  Take 1 tablet (10 mg total) by mouth daily.  Patient not taking: Reported on 12/03/2023     Active Self  clopidogrel (PLAVIX) 75 MG tablet [id number]  Take 1 tablet (75 mg total) by mouth daily. Ladona Heinz, MD  Active   diclofenac  Sodium (VOLTAREN  ARTHRITIS PAIN) 1 % GEL [id number]  Apply 1 Application topically 4 (four) times daily  as needed (pain.). [provider]  Active Self  DULoxetine  (CYMBALTA ) 60 MG capsule [id number]  Take 1 capsule (60 mg total) by mouth daily.   Active Self  Estradiol  (VAGIFEM ) 10 MCG TABS vaginal tablet [id number]  Place 10 mcg vaginally 2 (two) times a week. [provider]  Active Self  hydrALAZINE  (APRESOLINE ) 50 MG  tablet [id number]  Take 1 tablet (50 mg total) by mouth 3 (three) times daily. Ladona Heinz, MD  Active   hydrOXYzine  (ATARAX ) 25 MG tablet [id number]  Take 25 mg by mouth 3 (three) times daily as needed for anxiety (or sleep). [provider]  Active Self  isosorbide  dinitrate (ISORDIL ) 30 MG tablet [id number]  Take 1 tablet (30 mg total) by mouth 3 (three) times daily. Ladona Heinz, MD  Active   levothyroxine  (SYNTHROID ) 100 MCG tablet [id number]  Take 1 tablet (100 mcg total) by mouth in the morning on an empty stomach.   Active Self  levothyroxine  (SYNTHROID ) 100 MCG tablet [id number]  Take 1 tablet (100 mcg total) by mouth every morning on an empty stomach.  Patient not taking: Reported on 12/03/2023     Active Self  lidocaine  (LIDODERM ) 5 % [id number]  Apply 1 patch to skin per package directions, leave on most painful area for up to 12 hours. Max 1 patch daily  Patient taking differently: Place 1 patch onto the skin daily as needed (pain.).     Active Self  losartan  (COZAAR ) 100 MG tablet [id number]  Take 1 tablet (100 mg total) by mouth daily.   Active Self  methylPREDNISolone  (MEDROL  DOSEPAK) 4 MG TBPK tablet [id number]  Take 6 tablets (24 mg total) by mouth daily for 1 day, THEN 5 tablets (20 mg total) daily for 1 day, THEN 4 tablets (16 mg total) daily for 1 day, THEN 3 tablets (12 mg total) daily for 1 day, THEN 2 tablets (8 mg total) daily for 1 day, THEN 1 tablet (4 mg total) daily for 1 day.   Active   metoprolol  succinate (TOPROL  XL) 25 MG 24 hr tablet [id number]  Take 1 tablet (25 mg total) by mouth daily. Ladona Heinz, MD  Active Self  nitroGLYCERIN  (NITROSTAT ) 0.4 MG SL tablet [id number]  Place 1 tablet (0.4 mg total) under the tongue every 5 (five) minutes as needed for chest pain. Ladona Heinz, MD  Active Self  OVER THE COUNTER MEDICATION [id number]  Take 1 Dose by mouth at bedtime. Sound Asleep Gummy [provider]  Active Self, Pharmacy Records  Oxycodone  HCl 10 MG TABS  [id number] Yes Take 1 tablet (10 mg total) by mouth every 6 (six) hours as needed for pain.   Active   oxyCODONE -acetaminophen  (PERCOCET/ROXICET) 5-325 MG tablet [id number]  Take 1 tablet by mouth 5 (five) times daily as needed for pain.  Patient taking differently: Take 1 tablet by mouth every 4 (four) hours as needed.     Active Self  rosuvastatin  (CRESTOR ) 20 MG tablet [id number] Yes Take 1 tablet (20 mg total) by mouth daily. Madie Jon Garre, PA  Active Self  Tiotropium Bromide  Monohydrate (SPIRIVA  RESPIMAT) 1.25 MCG/ACT AERS [id number] Yes Inhale 2 puffs into the lungs daily. Geronimo Amel, MD  Active Self  tiZANidine  (ZANAFLEX ) 4 MG tablet [id number] Yes Take 1 tablet (4 mg total) by mouth every 6 (six) hours as needed for muscle spasms. Gillie Duncans, MD  Active   tiZANidine  (ZANAFLEX ) 4 MG tablet [id number] Yes Take 1 tablet (4 mg total) by mouth every 6 (six)  hours as needed for muscle spasms Gillie Duncans, MD  Active   valACYclovir  (VALTREX ) 1000 MG tablet [id number] Yes Take 1,000 mg by mouth 2 (two) times daily as needed ([place]). [provider]  Active Self, Pharmacy Records           Med Note CLAUD, MICHEAL ONEIDA Schaumann Dec 03, 2023 12:18 PM)    Vitamin D -Vitamin K (D3 + K2 PO) [id number] Yes Take 1 tablet by mouth daily. [provider]  Active Self            Goals Addressed             This Visit's Progress    VBCI Transitions of Care (TOC) Care Plan       Problems:  Recent Hospitalization for treatment of Spinal stenosis of cervical region with radiculopathy Knowledge Deficit Related to Spinal stenosis of cervical region with radiculopathy/s/p Anterior cervical discectomies and fusion and C3-4 C4-5 C5-6  Goal:  Over the next 30 days, the patient will not experience hospital readmission  Interventions:  Transitions of Care: 12/23/23 Patient reports she is doing much better but did have an ED visit over the weekend due to pain.  She is taking oxycodone   10mg  as needed.  Pain 6/10 today. She is out walking her dog.  Incision with no signs of infection with steri strips over incision.  No issues with constipation.  Follow up with Dr. Onetha 12-24-23.    Doctor Visits  - discussed the importance of doctor visits Post discharge activity limitations prescribed by provider reviewed Reviewed Signs and symptoms of infection  Patient Self Care Activities:  Attend all scheduled provider appointments Call pharmacy for medication refills 3-7 days in advance of running out of medications Call provider office for new concerns or questions  Notify RN Care Manager of TOC call rescheduling needs Participate in Transition of Care Program/Attend TOC scheduled calls Take medications as prescribed   Monitor incision for Redness, drainage, or swelling Leave steri-strips on neck. They will fall off by themselves. Do not put any creams, lotions, or ointments on incision. You are fine to shower. Let water run over incision and pat dry. Walk each and every day, increasing distance each day. No lifting greater than 8 lbs. Avoid excessive neck motion. No driving, or riding a car unless coming back and forth to see the doctor Call Your Doctor if these occur Redness, drainage, or swelling at the wound. Temperature greater than 101 degrees. Severe pain not relieved by pain medication. Incision starts to come apart.  Plan:  The patient has been provided with contact information for the care management team and has been advised to call with any health related questions or concerns.         Recommendation:   Continue Current Plan of Care  Follow Up Plan:   Telephone follow-up in 1 week   Devinn Hurwitz J. Reid Nawrot RN, MSN [facility], [facility] RN Care Manager Direct Dial: [phone]  Fax: [phone] Website: [email or web address] '

## 2023-12-24 ENCOUNTER — Other Ambulatory Visit (HOSPITAL_COMMUNITY): Payer: Self-pay

## 2023-12-29 ENCOUNTER — Other Ambulatory Visit: Payer: Self-pay

## 2023-12-29 NOTE — Transitions of Care (Post Inpatient/ED Visit) (Signed)
 Transition of Care week 3  Visit Note  12/29/2023  Name: Amanda Kim MRN: [id number]          DOB: 04/27/1945  Situation: Patient enrolled in [facility] 30-day program. Visit completed with patient by telephone.   Background:   Initial Transition Care Management Follow-up Telephone Call Discharge Date and Diagnosis: 12/12/23, Spinal stenosis of cervical region with radiculopathy   Past Medical History:  Diagnosis Date   Back pain    d/t horse fell on pt in the 80's   Bone spur    in neck(since 1990) and 1st 3 fingers on right hand go numb   Chronic pain    d/t horse accident in the 80's   Headache(784.0)    tension   History of colon polyps    Hyperlipidemia    takes Fish Oil and CoQ10   Hypertension    takes Inderal  daily   Hypothyroid 05/07/2011   takes Synthroid  daily   Leg cramps    takes Potassium prn   Liver disease    nonalcholic fatty liver disease   Myocardial infarction (HCC)    Nausea & vomiting    phenergan  and zofran  prn   PONV (postoperative nausea and vomiting)    Spondylolisthesis    Spondylosis of thoracolumbar region without myelopathy or radiculopathy    spine   Urinary frequency     Assessment: Patient Reported Symptoms: Cognitive Cognitive Status: Alert and oriented to person, place, and time, Normal speech and language skills      Neurological Neurological Review of Symptoms: Other: Oher Neurological Symptoms/Conditions [RPT]: Spinal Stenosis with recent surgery. Patient reports she is doing well today. Pain 6/10. Neurological Management Strategies: Medication therapy, Routine screening, Activity Neurological Self-Management Outcome: 4 (good)  HEENT HEENT Symptoms Reported: No symptoms reported      Cardiovascular Cardiovascular Symptoms Reported: No symptoms reported    Respiratory Respiratory Symptoms Reported: No symptoms reported    Endocrine Endocrine Symptoms Reported: No symptoms reported    Gastrointestinal  Gastrointestinal Symptoms Reported: No symptoms reported      Genitourinary Genitourinary Symptoms Reported: No symptoms reported    Integumentary Integumentary Symptoms Reported: Incision Additional Integumentary Details: Patient reports mid neck incision. Steri strips to area. No signs of infection. Skin Management Strategies: Routine screening Skin Self-Management Outcome: 4 (good)  Musculoskeletal Musculoskelatal Symptoms Reviewed: Limited mobility Additional Musculoskeletal Details: Reports improvement daily.  Discussed pacing self with activitiy. Musculoskeletal Management Strategies: Activity Musculoskeletal Self-Management Outcome: 4 (good)      Psychosocial Psychosocial Symptoms Reported: No symptoms reported         There were no vitals filed for this visit. Pain Scale: 0-10 Pain Score: 6  Pain Type: Surgical pain Pain Location: Neck Pain Orientation: Anterior, Right Pain Descriptors / Indicators: Aching Pain Onset: On-going Patients Stated Pain Goal: 1 Pain Intervention(s): Medication (See eMAR)  Medications Reviewed Today     Reviewed by Dayanara Sherrill, RN (Case Manager) on 12/29/23 at 1107  Med List Status: <None>   Medication Order Taking? Sig Documenting Provider Last Dose Status Informant  acetaminophen  (TYLENOL ) 500 MG tablet [id number] Yes Take 500-1,000 mg by mouth every 6 (six) hours as needed (pain.). [provider]  Active Self  amLODipine  (NORVASC ) 10 MG tablet [id number]  Take 1 tablet (10 mg total) by mouth daily.  Patient not taking: Reported on 12/29/2023     Active Self  clopidogrel (PLAVIX) 75 MG tablet [id number] Yes Take 1 tablet (75 mg total) by mouth daily. Ladona Heinz, MD  Active   diclofenac  Sodium (VOLTAREN  ARTHRITIS PAIN) 1 % GEL [id number] Yes Apply 1 Application topically 4 (four) times daily as needed (pain.). [provider]  Active Self  DULoxetine  (CYMBALTA ) 60 MG capsule [id number] Yes Take 1 capsule (60 mg total) by  mouth daily.   Active Self  Estradiol  (VAGIFEM ) 10 MCG TABS vaginal tablet [id number] Yes Place 10 mcg vaginally 2 (two) times a week. [provider]  Active Self  hydrALAZINE  (APRESOLINE ) 50 MG tablet [id number] Yes Take 1 tablet (50 mg total) by mouth 3 (three) times daily. Ladona Heinz, MD  Active   hydrOXYzine  (ATARAX ) 25 MG tablet [id number] Yes Take 25 mg by mouth 3 (three) times daily as needed for anxiety (or sleep). [provider]  Active Self  isosorbide  dinitrate (ISORDIL ) 30 MG tablet [id number] Yes Take 1 tablet (30 mg total) by mouth 3 (three) times daily. Ladona Heinz, MD  Active   levothyroxine  (SYNTHROID ) 100 MCG tablet [id number] Yes Take 1 tablet (100 mcg total) by mouth in the morning on an empty stomach.   Active Self  levothyroxine  (SYNTHROID ) 100 MCG tablet [id number] Yes Take 1 tablet (100 mcg total) by mouth every morning on an empty stomach.   Active Self  lidocaine  (LIDODERM ) 5 % [id number] Yes Apply 1 patch to skin per package directions, leave on most painful area for up to 12 hours. Max 1 patch daily  Patient taking differently: Place 1 patch onto the skin daily as needed (pain.).     Active Self  losartan  (COZAAR ) 100 MG tablet [id number] Yes Take 1 tablet (100 mg total) by mouth daily.   Active Self  metoprolol  succinate (TOPROL  XL) 25 MG 24 hr tablet [id number] Yes Take 1 tablet (25 mg total) by mouth daily. Ladona Heinz, MD  Active Self  nitroGLYCERIN  (NITROSTAT ) 0.4 MG SL tablet [id number] Yes Place 1 tablet (0.4 mg total) under the tongue every 5 (five) minutes as needed for chest pain. Ladona Heinz, MD  Active Self  OVER THE COUNTER MEDICATION [id number] Yes Take 1 Dose by mouth at bedtime. Sound Asleep Gummy [provider]  Active Self, Pharmacy Records  Oxycodone  HCl 10 MG TABS [id number] Yes Take 1 tablet (10 mg total) by mouth every 6 (six) hours as needed for pain.   Active   oxyCODONE -acetaminophen  (PERCOCET/ROXICET) 5-325 MG tablet [id number] Yes  Take 1 tablet by mouth 5 (five) times daily as needed for pain.   Active Self  rosuvastatin  (CRESTOR ) 20 MG tablet [id number] Yes Take 1 tablet (20 mg total) by mouth daily. Madie Jon Garre, PA  Active Self  Tiotropium Bromide  Monohydrate (SPIRIVA  RESPIMAT) 1.25 MCG/ACT AERS [id number] Yes Inhale 2 puffs into the lungs daily. Geronimo Amel, MD  Active Self  tiZANidine  (ZANAFLEX ) 4 MG tablet [id number] Yes Take 1 tablet (4 mg total) by mouth every 6 (six) hours as needed for muscle spasms. Gillie Duncans, MD  Active   tiZANidine  (ZANAFLEX ) 4 MG tablet [id number] Yes Take 1 tablet (4 mg total) by mouth every 6 (six) hours as needed for muscle spasms Gillie Duncans, MD  Active   valACYclovir  (VALTREX ) 1000 MG tablet [id number] Yes Take 1,000 mg by mouth 2 (two) times daily as needed ([place]). [provider]  Active Self, Pharmacy Records           Med Note CLAUD, MICHEAL ONEIDA Schaumann Dec 03, 2023 12:18 PM)    Vitamin D -Vitamin K (D3 + K2 PO) [id number] Yes Take 1 tablet by mouth daily.  [provider]  Active Self            Goals Addressed             This Visit's Progress    VBCI Transitions of Care (TOC) Care Plan       Problems:  Recent Hospitalization for treatment of Spinal stenosis of cervical region with radiculopathy Knowledge Deficit Related to Spinal stenosis of cervical region with radiculopathy/s/p Anterior cervical discectomies and fusion and C3-4 C4-5 C5-6  Goal:  Over the next 30 days, the patient will not experience hospital readmission  Interventions:  Transitions of Care: 12/29/23 Patient reports she is doing better.  Pain 6/10 today. Taking pain medication and muscle relaxer as needed.  No issues with incision.  Discussed importance of pacing self with activities.    Doctor Visits  - discussed the importance of doctor visits Post discharge activity limitations prescribed by provider reviewed Reviewed Signs and symptoms of infection  Patient Self  Care Activities:  Attend all scheduled provider appointments Call pharmacy for medication refills 3-7 days in advance of running out of medications Call provider office for new concerns or questions  Notify RN Care Manager of TOC call rescheduling needs Participate in Transition of Care Program/Attend TOC scheduled calls Take medications as prescribed   Monitor incision for Redness, drainage, or swelling Leave steri-strips on neck. They will fall off by themselves. Do not put any creams, lotions, or ointments on incision. You are fine to shower. Let water run over incision and pat dry. Walk each and every day, increasing distance each day. No lifting greater than 8 lbs. Avoid excessive neck motion. No driving, or riding a car unless coming back and forth to see the doctor Call Your Doctor if these occur Redness, drainage, or swelling at the wound. Temperature greater than 101 degrees. Severe pain not relieved by pain medication. Incision starts to come apart.  Plan:  The patient has been provided with contact information for the care management team and has been advised to call with any health related questions or concerns.         Recommendation:   Continue Current Plan of Care  Follow Up Plan:   Telephone follow-up in 1 week  Ibtisam Benge J. Viyan Rosamond RN, MSN [facility], [facility] RN Care Manager Direct Dial: [phone]  Fax: [phone] Website: [email or web address]

## 2023-12-29 NOTE — Patient Instructions (Signed)
 Visit Information  Thank you for taking time to visit with me today. Please don't hesitate to contact me if I can be of assistance to you before our next scheduled telephone appointment.  Our next appointment is by telephone on 01/05/24 at 1130 am  Following is a copy of your care plan:   Goals Addressed             This Visit's Progress    [facility] Transitions of Care (TOC) Care Plan       Problems:  Recent Hospitalization for treatment of Spinal stenosis of cervical region with radiculopathy Knowledge Deficit Related to Spinal stenosis of cervical region with radiculopathy/s/p Anterior cervical discectomies and fusion and C3-4 C4-5 C5-6  Goal:  Over the next 30 days, the patient will not experience hospital readmission  Interventions:  Transitions of Care: 12/29/23 Patient reports she is doing better.  Pain 6/10 today. Taking pain medication and muscle relaxer as needed.  No issues with incision.  Discussed importance of pacing self with activities.    Doctor Visits  - discussed the importance of doctor visits Post discharge activity limitations prescribed by provider reviewed Reviewed Signs and symptoms of infection  Patient Self Care Activities:  Attend all scheduled provider appointments Call pharmacy for medication refills 3-7 days in advance of running out of medications Call provider office for new concerns or questions  Notify RN Care Manager of TOC call rescheduling needs Participate in Transition of Care Program/Attend TOC scheduled calls Take medications as prescribed   Monitor incision for Redness, drainage, or swelling Leave steri-strips on neck. They will fall off by themselves. Do not put any creams, lotions, or ointments on incision. You are fine to shower. Let water run over incision and pat dry. Walk each and every day, increasing distance each day. No lifting greater than 8 lbs. Avoid excessive neck motion. No driving, or riding a car unless coming back and  forth to see the doctor Call Your Doctor if these occur Redness, drainage, or swelling at the wound. Temperature greater than 101 degrees. Severe pain not relieved by pain medication. Incision starts to come apart.  Plan:  The patient has been provided with contact information for the care management team and has been advised to call with any health related questions or concerns.         Patient verbalizes understanding of instructions and care plan provided today and agrees to view in MyChart. Active MyChart status and patient understanding of how to access instructions and care plan via MyChart confirmed with patient.     The patient has been provided with contact information for the care management team and has been advised to call with any health related questions or concerns.   Please call the care guide team at [phone] if you need to cancel or reschedule your appointment.   Please call the Suicide and Crisis Lifeline: 988 if you are experiencing a Mental Health or Behavioral Health Crisis or need someone to talk to.  Jesus Poplin J. Beau Ramsburg RN, MSN [facility], [facility] RN Care Manager Direct Dial: [phone]  Fax: [phone] Website: [email or web address]

## 2023-12-30 ENCOUNTER — Other Ambulatory Visit (HOSPITAL_COMMUNITY): Payer: Self-pay

## 2023-12-30 MED ORDER — TIZANIDINE HCL 4 MG PO TABS
4.0000 mg | ORAL_TABLET | Freq: Four times a day (QID) | ORAL | 0 refills | Status: DC | PRN
  Filled 2023-12-30: qty 40, 10d supply, fill #0

## 2023-12-31 DIAGNOSIS — H903 Sensorineural hearing loss, bilateral: Secondary | ICD-10-CM | POA: Diagnosis not present

## 2023-12-31 DIAGNOSIS — Z23 Encounter for immunization: Secondary | ICD-10-CM | POA: Diagnosis not present

## 2024-01-04 ENCOUNTER — Other Ambulatory Visit (HOSPITAL_COMMUNITY): Payer: Self-pay

## 2024-01-04 DIAGNOSIS — I10 Essential (primary) hypertension: Secondary | ICD-10-CM | POA: Diagnosis not present

## 2024-01-04 DIAGNOSIS — G8929 Other chronic pain: Secondary | ICD-10-CM | POA: Diagnosis not present

## 2024-01-04 DIAGNOSIS — M5030 Other cervical disc degeneration, unspecified cervical region: Secondary | ICD-10-CM | POA: Diagnosis not present

## 2024-01-04 DIAGNOSIS — R030 Elevated blood-pressure reading, without diagnosis of hypertension: Secondary | ICD-10-CM | POA: Diagnosis not present

## 2024-01-04 DIAGNOSIS — M545 Low back pain, unspecified: Secondary | ICD-10-CM | POA: Diagnosis not present

## 2024-01-04 DIAGNOSIS — Z79899 Other long term (current) drug therapy: Secondary | ICD-10-CM | POA: Diagnosis not present

## 2024-01-04 DIAGNOSIS — M2578 Osteophyte, vertebrae: Secondary | ICD-10-CM | POA: Diagnosis not present

## 2024-01-04 MED ORDER — OXYCODONE-ACETAMINOPHEN 7.5-325 MG PO TABS
1.0000 | ORAL_TABLET | Freq: Four times a day (QID) | ORAL | 0 refills | Status: DC | PRN
  Filled 2024-01-04: qty 120, 30d supply, fill #0

## 2024-01-05 ENCOUNTER — Other Ambulatory Visit (HOSPITAL_COMMUNITY): Payer: Self-pay

## 2024-01-05 ENCOUNTER — Other Ambulatory Visit: Payer: Self-pay

## 2024-01-05 NOTE — Transitions of Care (Post Inpatient/ED Visit) (Signed)
 Transition of Care week 4  Visit Note  01/05/2024  Name: Amanda Kim MRN: [id number]          DOB: [birth year]  Situation: Patient enrolled in [facility] 30-day program. Visit completed with patient by telephone.   Background:   Initial Transition Care Management Follow-up Telephone Call Discharge Date and Diagnosis: 12/12/23, Spinal stenosis of cervical region with radiculopathy   Past Medical History:  Diagnosis Date   Back pain    d/t horse fell on pt in the 80's   Bone spur    in neck(since 1990) and 1st 3 fingers on right hand go numb   Chronic pain    d/t horse accident in the 80's   Headache(784.0)    tension   History of colon polyps    Hyperlipidemia    takes Fish Oil and CoQ10   Hypertension    takes Inderal  daily   Hypothyroid 05/07/2011   takes Synthroid  daily   Leg cramps    takes Potassium prn   Liver disease    nonalcholic fatty liver disease   Myocardial infarction (HCC)    Nausea & vomiting    phenergan  and zofran  prn   PONV (postoperative nausea and vomiting)    Spondylolisthesis    Spondylosis of thoracolumbar region without myelopathy or radiculopathy    spine   Urinary frequency     Assessment: Patient Reported Symptoms: Cognitive Cognitive Status: Alert and oriented to person, place, and time, Normal speech and language skills      Neurological [place] Neurological Symptoms/Conditions [RPT]: Spinal Stenosis with recent surgery.  Doing good.  Taking yoga.  Pain 7/10. Taking tylenol  as needed. Neurological Management Strategies: Medication therapy, Routine screening, Activity Neurological Self-Management Outcome: 4 (good)  HEENT HEENT Symptoms Reported: No symptoms reported      Cardiovascular Cardiovascular Symptoms Reported: No symptoms reported    Respiratory Respiratory Symptoms Reported: No symptoms reported    Endocrine Endocrine Symptoms Reported: No symptoms reported    Gastrointestinal Gastrointestinal Symptoms Reported: No  symptoms reported      Genitourinary Genitourinary Symptoms Reported: No symptoms reported    Integumentary Integumentary Symptoms Reported: Incision Additional Integumentary Details: Patient reports mid neck incision. No signs of infection. Skin Management Strategies: Routine screening Skin Self-Management Outcome: 4 (good)  Musculoskeletal Musculoskelatal Symptoms Reviewed: Limited mobility Additional Musculoskeletal Details: Patient doing more and starting yoga Musculoskeletal Management Strategies: Activity Musculoskeletal Self-Management Outcome: 4 (good)      Psychosocial Psychosocial Symptoms Reported: No symptoms reported         There were no vitals filed for this visit. Pain Scale: 0-10 Pain Score: 7  Pain Type: Surgical pain Pain Location: Neck Pain Orientation: Right, Anterior Pain Descriptors / Indicators: Discomfort Pain Onset: On-going Patients Stated Pain Goal: 1 Pain Intervention(s): Medication (See eMAR)  Medications Reviewed Today     Reviewed by Marquel Pottenger, RN (Case Manager) on 01/05/24 at 1141  Med List Status: <None>   Medication Order Taking? Sig Documenting Provider Last Dose Status Informant  acetaminophen  (TYLENOL ) 500 MG tablet [id number] Yes Take 500-1,000 mg by mouth every 6 (six) hours as needed (pain.). [provider]  Active Self  amLODipine  (NORVASC ) 10 MG tablet [id number]  Take 1 tablet (10 mg total) by mouth daily.  Patient not taking: Reported on 01/05/2024     Active Self  clopidogrel (PLAVIX) 75 MG tablet [id number] Yes Take 1 tablet (75 mg total) by mouth daily. Ladona Heinz, MD  Active   diclofenac  Sodium (VOLTAREN  ARTHRITIS  PAIN) 1 % GEL [id number] Yes Apply 1 Application topically 4 (four) times daily as needed (pain.). [provider]  Active Self  DULoxetine  (CYMBALTA ) 60 MG capsule [id number] Yes Take 1 capsule (60 mg total) by mouth daily.   Active Self  Estradiol  (VAGIFEM ) 10 MCG TABS vaginal tablet [id number]  Yes Place 10 mcg vaginally 2 (two) times a week. [provider]  Active Self  hydrALAZINE  (APRESOLINE ) 50 MG tablet [id number] Yes Take 1 tablet (50 mg total) by mouth 3 (three) times daily. Ladona Heinz, MD  Active   hydrOXYzine  (ATARAX ) 25 MG tablet [id number] Yes Take 25 mg by mouth 3 (three) times daily as needed for anxiety (or sleep). [provider]  Active Self  isosorbide  dinitrate (ISORDIL ) 30 MG tablet [id number] Yes Take 1 tablet (30 mg total) by mouth 3 (three) times daily. Ladona Heinz, MD  Active   levothyroxine  (SYNTHROID ) 100 MCG tablet [id number] Yes Take 1 tablet (100 mcg total) by mouth in the morning on an empty stomach.   Active Self  levothyroxine  (SYNTHROID ) 100 MCG tablet [id number] Yes Take 1 tablet (100 mcg total) by mouth every morning on an empty stomach.   Active Self  lidocaine  (LIDODERM ) 5 % [id number] Yes Apply 1 patch to skin per package directions, leave on most painful area for up to 12 hours. Max 1 patch daily  Patient taking differently: Place 1 patch onto the skin daily as needed (pain.).     Active Self  losartan  (COZAAR ) 100 MG tablet [id number] Yes Take 1 tablet (100 mg total) by mouth daily.   Active Self  metoprolol  succinate (TOPROL  XL) 25 MG 24 hr tablet [id number] Yes Take 1 tablet (25 mg total) by mouth daily. Ladona Heinz, MD  Active Self  nitroGLYCERIN  (NITROSTAT ) 0.4 MG SL tablet [id number] Yes Place 1 tablet (0.4 mg total) under the tongue every 5 (five) minutes as needed for chest pain. Ladona Heinz, MD  Active Self  OVER THE COUNTER MEDICATION [id number] Yes Take 1 Dose by mouth at bedtime. Sound Asleep Gummy [provider]  Active Self, Pharmacy Records  Oxycodone  HCl 10 MG TABS [id number] Yes Take 1 tablet (10 mg total) by mouth every 6 (six) hours as needed for pain.   Active   oxyCODONE -acetaminophen  (PERCOCET) 7.5-325 MG tablet [id number] Yes Take 1 tablet by mouth 4 (four) times daily as needed for pain   Active    oxyCODONE -acetaminophen  (PERCOCET/ROXICET) 5-325 MG tablet [id number] Yes Take 1 tablet by mouth 5 (five) times daily as needed for pain.   Active Self  rosuvastatin  (CRESTOR ) 20 MG tablet [id number] Yes Take 1 tablet (20 mg total) by mouth daily. Madie Jon Garre, PA  Active Self  Tiotropium Bromide  Monohydrate (SPIRIVA  RESPIMAT) 1.25 MCG/ACT AERS [id number] Yes Inhale 2 puffs into the lungs daily. Geronimo Amel, MD  Active Self  tiZANidine  (ZANAFLEX ) 4 MG tablet [id number]  Take 1 tablet (4 mg total) by mouth every 6 (six) hours as needed for muscle spasms.  Patient not taking: Reported on 01/05/2024   Gillie Duncans, MD  Active   tiZANidine  (ZANAFLEX ) 4 MG tablet [id number] Yes Take 1 tablet by mouth every 6 hours as needed for muscle spasms.   Active   valACYclovir  (VALTREX ) 1000 MG tablet [id number] Yes Take 1,000 mg by mouth 2 (two) times daily as needed ([place]). [provider]  Active Self, Pharmacy Records           Med Note CLAUD, MICHEAL ONEIDA Schaumann Dec 03, 2023 12:18 PM)    Vitamin D -Vitamin K (D3 + K2 PO) [id number] Yes Take 1 tablet by mouth daily. [provider]  Active Self            Goals Addressed             This Visit's Progress    [facility]BCI Transitions of Care (TOC) Care Plan       Problems:  Recent Hospitalization for treatment of Spinal stenosis of cervical region with radiculopathy Knowledge Deficit Related to Spinal stenosis of cervical region with radiculopathy/s/p Anterior cervical discectomies and fusion and C3-4 C4-5 C5-6  Goal:  Over the next 30 days, the patient will not experience hospital readmission  Interventions:  Transitions of Care: 01/05/24 Patient reports she is doing better.  Doing more and feeling better and doing gentle yoga.  Pain 7/10 today. Taking pain medication and muscle relaxer as needed.  No issues with incision.  Discussed importance of pacing self with activities.  Follow up with surgeon 12/2.    Doctor Visits  -  discussed the importance of doctor visits Post discharge activity limitations prescribed by provider reviewed Reviewed Signs and symptoms of infection  Patient Self Care Activities:  Attend all scheduled provider appointments Call pharmacy for medication refills 3-7 days in advance of running out of medications Call provider office for new concerns or questions  Notify RN Care Manager of TOC call rescheduling needs Participate in Transition of Care Program/Attend TOC scheduled calls Take medications as prescribed   Monitor incision for Redness, drainage, or swelling Leave steri-strips on neck. They will fall off by themselves. Do not put any creams, lotions, or ointments on incision. You are fine to shower. Let water run over incision and pat dry. Walk each and every day, increasing distance each day. No lifting greater than 8 lbs. Avoid excessive neck motion. No driving, or riding a car unless coming back and forth to see the doctor Call Your Doctor if these occur Redness, drainage, or swelling at the wound. Temperature greater than 101 degrees. Severe pain not relieved by pain medication. Incision starts to come apart.  Plan:  The patient has been provided with contact information for the care management team and has been advised to call with any health related questions or concerns.         Recommendation:   Continue Current Plan of Care  Follow Up Plan:   Telephone follow-up 2 weeks  Aricka Goldberger J. Israel Werts RN, MSN [facility], [facility] Health RN Care Manager Direct Dial: [phone]  Fax: [phone] Website: [email or web address]

## 2024-01-05 NOTE — Patient Instructions (Signed)
 Visit Information  Thank you for taking time to visit with me today. Please don't hesitate to contact me if I can be of assistance to you before our next scheduled telephone appointment.  Our next appointment is by telephone on 01/20/24 at 1130  Following is a copy of your care plan:   Goals Addressed             This Visit's Progress    [facility]BCI Transitions of Care (TOC) Care Plan       Problems:  Recent Hospitalization for treatment of Spinal stenosis of cervical region with radiculopathy Knowledge Deficit Related to Spinal stenosis of cervical region with radiculopathy/s/p Anterior cervical discectomies and fusion and C3-4 C4-5 C5-6  Goal:  Over the next 30 days, the patient will not experience hospital readmission  Interventions:  Transitions of Care: 01/05/24 Patient reports she is doing better.  Doing more and feeling better and doing gentle yoga.  Pain 7/10 today. Taking pain medication and muscle relaxer as needed.  No issues with incision.  Discussed importance of pacing self with activities.  Follow up with surgeon 12/2.    Doctor Visits  - discussed the importance of doctor visits Post discharge activity limitations prescribed by provider reviewed Reviewed Signs and symptoms of infection  Patient Self Care Activities:  Attend all scheduled provider appointments Call pharmacy for medication refills 3-7 days in advance of running out of medications Call provider office for new concerns or questions  Notify RN Care Manager of TOC call rescheduling needs Participate in Transition of Care Program/Attend TOC scheduled calls Take medications as prescribed   Monitor incision for Redness, drainage, or swelling Leave steri-strips on neck. They will fall off by themselves. Do not put any creams, lotions, or ointments on incision. You are fine to shower. Let water run over incision and pat dry. Walk each and every day, increasing distance each day. No lifting greater than 8 lbs.  Avoid excessive neck motion. No driving, or riding a car unless coming back and forth to see the doctor Call Your Doctor if these occur Redness, drainage, or swelling at the wound. Temperature greater than 101 degrees. Severe pain not relieved by pain medication. Incision starts to come apart.  Plan:  The patient has been provided with contact information for the care management team and has been advised to call with any health related questions or concerns.         Patient verbalizes understanding of instructions and care plan provided today and agrees to view in MyChart. Active MyChart status and patient understanding of how to access instructions and care plan via MyChart confirmed with patient.     The patient has been provided with contact information for the care management team and has been advised to call with any health related questions or concerns.   Please call the care guide team at [phone] if you need to cancel or reschedule your appointment.   Please call the Suicide and Crisis Lifeline: 988 if you are experiencing a Mental Health or Behavioral Health Crisis or need someone to talk to.  Aleynah Rocchio J. Irini Leet RN, MSN [facility], [facility] Health RN Care Manager Direct Dial: [phone]  Fax: [phone] Website: [email or web address]

## 2024-01-06 ENCOUNTER — Ambulatory Visit

## 2024-01-06 DIAGNOSIS — Z79899 Other long term (current) drug therapy: Secondary | ICD-10-CM | POA: Diagnosis not present

## 2024-01-19 DIAGNOSIS — Z6825 Body mass index (BMI) 25.0-25.9, adult: Secondary | ICD-10-CM | POA: Diagnosis not present

## 2024-01-19 DIAGNOSIS — M5481 Occipital neuralgia: Secondary | ICD-10-CM | POA: Diagnosis not present

## 2024-01-19 DIAGNOSIS — M542 Cervicalgia: Secondary | ICD-10-CM | POA: Diagnosis not present

## 2024-01-20 ENCOUNTER — Telehealth: Payer: Self-pay

## 2024-01-20 ENCOUNTER — Ambulatory Visit: Admission: RE | Admit: 2024-01-20 | Discharge: 2024-01-20 | Disposition: A | Source: Ambulatory Visit

## 2024-01-20 ENCOUNTER — Other Ambulatory Visit: Payer: Self-pay

## 2024-01-20 DIAGNOSIS — Z1231 Encounter for screening mammogram for malignant neoplasm of breast: Secondary | ICD-10-CM | POA: Diagnosis not present

## 2024-01-20 DIAGNOSIS — F4323 Adjustment disorder with mixed anxiety and depressed mood: Secondary | ICD-10-CM | POA: Diagnosis not present

## 2024-01-20 NOTE — Transitions of Care (Post Inpatient/ED Visit) (Signed)
 Transition of Care Week 5  Visit Note  01/20/2024  Name: Amanda Kim MRN: [id number]          DOB: June 09, 1945  Situation: Patient enrolled in [facility] 30-day program. Visit completed with patient by telephone.   Background:   Initial Transition Care Management Follow-up Telephone Call Discharge Date and Diagnosis: 12/12/23, Spinal stenosis of cervical region with radiculopathy   Past Medical History:  Diagnosis Date   Back pain    d/t horse fell on pt in the 80's   Bone spur    in neck(since 1990) and 1st 3 fingers on right hand go numb   Chronic pain    d/t horse accident in the 80's   Headache(784.0)    tension   History of colon polyps    Hyperlipidemia    takes Fish Oil and CoQ10   Hypertension    takes Inderal  daily   Hypothyroid 05/07/2011   takes Synthroid  daily   Leg cramps    takes Potassium prn   Liver disease    nonalcholic fatty liver disease   Myocardial infarction (HCC)    Nausea & vomiting    phenergan  and zofran  prn   PONV (postoperative nausea and vomiting)    Spondylolisthesis    Spondylosis of thoracolumbar region without myelopathy or radiculopathy    spine   Urinary frequency     Assessment: Patient Reported Symptoms: Cognitive Cognitive Status: Alert and oriented to person, place, and time, Normal speech and language skills      Neurological Neurological Review of Symptoms: Weakness, Other: [place] Neurological Symptoms/Conditions [RPT]: s/p Spinal Stenosis surgery, states she is not doing well, pain issues, Can't lift RIGHT arm, Headaches, shoulder spasms, I am worse off after surgery. Has been to see neurosurgeon. Neurological Management Strategies: Medication therapy, Coping strategies, Adequate rest, Activity, Routine screening Neurological Self-Management Outcome: 2 (bad) Neurological Comment: Trying alternative therapies accupuncture and massage and cupping. To check with payor benefit to see if anything can be covered to assist.   HEENT HEENT Symptoms Reported: No symptoms reported      Cardiovascular   Cardiovascular Comment: Reports has a heart attack last year, no chest pain.  Respiratory Respiratory Symptoms Reported: No symptoms reported    Endocrine Endocrine Symptoms Reported: No symptoms reported    Gastrointestinal Gastrointestinal Symptoms Reported: Constipation Additional Gastrointestinal Details: spells of constipation, takes miralax daily. Gastrointestinal Management Strategies: Exercise Gastrointestinal Comment: Doing gentle yoga.    Genitourinary Genitourinary Symptoms Reported: No symptoms reported    Integumentary Integumentary Symptoms Reported: Incision Additional Integumentary Details: Surgical incision: patient reports scar tissue. Reported to provider. Skin Management Strategies: Routine screening Skin Self-Management Outcome: 4 (good)  Musculoskeletal Musculoskelatal Symptoms Reviewed: Limited mobility, Muscle pain, Weakness        Psychosocial Psychosocial Symptoms Reported: Sadness - if selected complete PHQ 2-9, Depression - if selected complete PHQ 2-9, Anxiety - if selected complete GAD Additional Psychological Details: Feeling worse and reports feeling not herself, depressed over pain and lack of progress. Behavioral Management Strategies: Adequate rest, Activity, Complementary therapy(ies), Coping strategies, Counseling, Medication therapy, Support system Behavioral Health Self-Management Outcome: 3 (uncertain) Major Change/Loss/Stressor/Fears (CP): Medical condition, self Techniques to Cope with Loss/Stress/Change: Counseling, Massage, Medication, Diversional activities Quality of Family Relationships: helpful, supportive Do you feel physically threatened by others?: No   There were no vitals filed for this visit. Pain Scale: 0-10 Pain Type: Surgical pain Pain Location: Neck Pain Orientation: Right, Anterior Pain Descriptors / Indicators: Discomfort, Burning, Spasm  (Having referred  shoulder pain) Pain Onset: On-going Patients Stated Pain Goal: 1 Pain Intervention(s): Medication (See eMAR), Acupuncture, Massage, Relaxation, Acupressure, Emotional support, MD notified (Comment) (Patient has connected with neurosurgeon and is also getting cupping.)  Medications Reviewed Today     Reviewed by Carolee Heron NOVAK, RN (Case Manager) on 01/20/24 at 1302  Med List Status: <None>   Medication Order Taking? Sig Documenting Provider Last Dose Status Informant  acetaminophen  (TYLENOL ) 500 MG tablet [id number] Yes Take 500-1,000 mg by mouth every 6 (six) hours as needed (pain.). [provider]  Active Self  amLODipine  (NORVASC ) 10 MG tablet [id number]  Take 1 tablet (10 mg total) by mouth daily.  Patient not taking: Reported on 01/20/2024     Active Self  clopidogrel  (PLAVIX ) 75 MG tablet [id number] Yes Take 1 tablet (75 mg total) by mouth daily. Ladona Heinz, MD  Active   diclofenac  Sodium (VOLTAREN  ARTHRITIS PAIN) 1 % GEL [id number] Yes Apply 1 Application topically 4 (four) times daily as needed (pain.). [provider]  Active Self  DULoxetine  (CYMBALTA ) 60 MG capsule [id number] Yes Take 1 capsule (60 mg total) by mouth daily.   Active Self  Estradiol  (VAGIFEM ) 10 MCG TABS vaginal tablet [id number] Yes Place 10 mcg vaginally 2 (two) times a week. [provider]  Active Self  hydrALAZINE  (APRESOLINE ) 50 MG tablet [id number] Yes Take 1 tablet (50 mg total) by mouth 3 (three) times daily. Ladona Heinz, MD  Active   hydrOXYzine  (ATARAX ) 25 MG tablet [id number] Yes Take 25 mg by mouth 3 (three) times daily as needed for anxiety (or sleep). [provider]  Active Self  isosorbide  dinitrate (ISORDIL ) 30 MG tablet [id number] Yes Take 1 tablet (30 mg total) by mouth 3 (three) times daily. Ladona Heinz, MD  Active   levothyroxine  (SYNTHROID ) 100 MCG tablet [id number] Yes Take 1 tablet (100 mcg total) by mouth in the morning on an empty stomach.   Active  Self  levothyroxine  (SYNTHROID ) 100 MCG tablet [id number] Yes Take 1 tablet (100 mcg total) by mouth every morning on an empty stomach.   Active Self  lidocaine  (LIDODERM ) 5 % [id number] Yes Apply 1 patch to skin per package directions, leave on most painful area for up to 12 hours. Max 1 patch daily  Patient taking differently: Place 1 patch onto the skin daily as needed (pain.).     Active Self  losartan  (COZAAR ) 100 MG tablet [id number] Yes Take 1 tablet (100 mg total) by mouth daily.   Active Self  metoprolol  succinate (TOPROL  XL) 25 MG 24 hr tablet [id number] Yes Take 1 tablet (25 mg total) by mouth daily. Ladona Heinz, MD  Active Self  nitroGLYCERIN  (NITROSTAT ) 0.4 MG SL tablet [id number] Yes Place 1 tablet (0.4 mg total) under the tongue every 5 (five) minutes as needed for chest pain. Ladona Heinz, MD  Active Self  OVER THE COUNTER MEDICATION [id number] Yes Take 1 Dose by mouth at bedtime. Sound Asleep Gummy [provider]  Active Self, Pharmacy Records  Oxycodone  HCl 10 MG TABS [id number] Yes Take 1 tablet (10 mg total) by mouth every 6 (six) hours as needed for pain.   Active   oxyCODONE -acetaminophen  (PERCOCET) 7.5-325 MG tablet [id number] Yes Take 1 tablet by mouth 4 (four) times daily as needed for pain   Active   oxyCODONE -acetaminophen  (PERCOCET/ROXICET) 5-325 MG tablet [id number] Yes Take 1 tablet by mouth 5 (five) times daily as needed for pain.   Active Self  polyethylene glycol (MIRALAX / GLYCOLAX) 17  g packet [id number] Yes Take 17 g by mouth daily as needed for moderate constipation. [provider]  Active   rosuvastatin  (CRESTOR ) 20 MG tablet [id number] Yes Take 1 tablet (20 mg total) by mouth daily. Madie Jon Garre, PA  Active Self  Tiotropium Bromide  Monohydrate (SPIRIVA  RESPIMAT) 1.25 MCG/ACT AERS [id number] Yes Inhale 2 puffs into the lungs daily. Geronimo Amel, MD  Active Self  tiZANidine  (ZANAFLEX ) 4 MG tablet [id number]  Take 1 tablet (4 mg total) by mouth every  6 (six) hours as needed for muscle spasms.  Patient not taking: Reported on 01/20/2024   Gillie Duncans, MD  Active   tiZANidine  (ZANAFLEX ) 4 MG tablet [id number] Yes Take 1 tablet by mouth every 6 hours as needed for muscle spasms.   Active   valACYclovir  (VALTREX ) 1000 MG tablet [id number] Yes Take 1,000 mg by mouth 2 (two) times daily as needed ([place]). [provider]  Active Self, Pharmacy Records           Med Note CLAUD, MICHEAL ONEIDA Schaumann Dec 03, 2023 12:18 PM)    Vitamin D -Vitamin K (D3 + K2 PO) [id number] Yes Take 1 tablet by mouth daily. [provider]  Active Self            Goals Addressed             This Visit's Progress    VBCI Transitions of Care (TOC) Care Plan       01/20/24 TOC RN CM reviewed and discussed with patient.   Problems:  Recent Hospitalization for treatment of Spinal stenosis of cervical region with radiculopathy Knowledge Deficit Related to Spinal stenosis of cervical region with radiculopathy/s/p Anterior cervical discectomies and fusion and C3-4 C4-5 C5-6  Goal:  Over the next 30 days, the patient will not experience hospital readmission  Interventions:  Transitions of Care: 01/05/24 Patient reports she is doing better.  Doing more and feeling better and doing gentle yoga.  Pain 7/10 today. Taking pain medication and muscle relaxer as needed.  No issues with incision.  Discussed importance of pacing self with activities.  Follow up with surgeon 12/2.   01/20/24: TOC week 5 outreach:  Patient reports feeling worse than before surgery with constant pain in neck, Right shoulder pain and weakness, constant headaches.   Saw neurosurgeon on 01/19/24, X-rays reviewed, nothing out of place, no real explanation for pain, referred for steroid injection patient will follow up with.  Reviewed modalities patient is using such as massage, acupuncture, cupping, medications, support system, therapy, antidepressant.  Reviewed medications list, no  changes since last outreach.  Discussed, allowed ventilation and expression of feelings of frustration and depression over ongoing pain, loss of 70 yr old dog last week, depression screening, review of therapy and medications.  Has therapy appointment today at 1 pm.  History of suicide in direct family members,  Discussed/allowed time for patient to express thoughts, does not express suicidal intent or a plan, but encouraged to discuss with therapist and reach out to suicide/crisis resources listed in AVS.  Good support at home with significant other.  Requests another follow up outreach next week.   Doctor Visits  - discussed the importance of doctor visits Post discharge activity limitations prescribed by provider reviewed Reviewed Signs and symptoms of infection  Patient Self Care Activities:   Attend all scheduled provider appointments. Continue self care activities for pain management.  Call pharmacy for medication refills 3-7 days in advance of running out of medications  Call provider office for new concerns or questions and inform of alternative modalities patient is using for pain.  Notify RN Care Manager of TOC call rescheduling needs Participate in Transition of Care Program/Attend TOC scheduled calls Take medications as prescribed   Monitor incision for Redness, drainage, or swelling: no issues reported other than some scar tissue forming that was discussed with provider on 01/19/24. Leave steri-strips on neck. They will fall off by themselves. Do not put any creams, lotions, or ointments on incision. You are fine to shower. Let water run over incision and pat dry. Walk each and every day, increasing distance each day. No lifting greater than 8 lbs. Avoid excessive neck motion. No driving, or riding a car unless coming back and forth to see the doctor  Call Your Doctor if these occur Redness, drainage, or swelling at the wound. Temperature greater than 101 degrees. Severe  pain not relieved by pain medication: Has seen neurosurgeon on 01/19/24 for sustained pain.  Incision starts to come apart.  Plan:  The patient has been provided with contact information for the care management team and has been advised to call with any health related questions or concerns.          Recommendation:   Continue Current Plan of Care  Follow Up Plan:   Telephone follow-up in 1 week 01/27/24 at 1130 am.    Bing Edison MSN, RN RN Case Manager [facility]  VBCI-Population Health Office Hours M-F [phone] Direct Dial: [phone] Main Phone [phone]  Fax: [phone] [email or web address]

## 2024-01-20 NOTE — Patient Instructions (Addendum)
 Visit Information  Thank you for taking time to visit with me today. Please don't hesitate to contact me if I can be of assistance to you before our next scheduled telephone appointment.  Our next appointment is by telephone on 01/27/24 at 1130 am  Following is a copy of your care plan:   Goals Addressed             Not on track    [facility]BCI Transitions of Care [facility]) Care Plan       01/20/24 [facility] RN CM reviewed and discussed with patient.   Problems:  Recent Hospitalization for treatment of Spinal stenosis of cervical region with radiculopathy Knowledge Deficit Related to Spinal stenosis of cervical region with radiculopathy/s/p Anterior cervical discectomies and fusion and C3-4 C4-5 C5-6  Goal:  Over the next 30 days, the patient will not experience hospital readmission  Interventions:  Transitions of Care: 01/05/24 Patient reports she is doing better.  Doing more and feeling better and doing gentle yoga.  Pain 7/10 today. Taking pain medication and muscle relaxer as needed.  No issues with incision.  Discussed importance of pacing self with activities.  Follow up with surgeon 12/2.   01/20/24: TOC week 5 outreach:  Patient reports feeling worse than before surgery with constant pain in neck, Right shoulder pain and weakness, constant headaches.   Saw neurosurgeon on 01/19/24, X-rays reviewed, nothing out of place, no real explanation for pain, referred for steroid injection patient will follow up with.  Reviewed modalities patient is using such as massage, acupuncture, cupping, medications, support system, therapy, antidepressant.  Reviewed medications list, no changes since last outreach.  Discussed, allowed ventilation and expression of feelings of frustration and depression over ongoing pain, loss of 67 yr old dog last week, depression screening, review of therapy and medications.  Has therapy appointment today at 1 pm.  History of suicide in direct family members,  Discussed/allowed  time for patient to express thoughts, does not express suicidal intent or a plan, but encouraged to discuss with therapist and reach out to suicide/crisis resources listed in AVS.  Good support at home with significant other.  Requests another follow up outreach next week.   Doctor Visits  - discussed the importance of doctor visits Post discharge activity limitations prescribed by provider reviewed Reviewed Signs and symptoms of infection  Patient Self Care Activities:   Attend all scheduled provider appointments. Continue self care activities for pain management.  Call pharmacy for medication refills 3-7 days in advance of running out of medications Call provider office for new concerns or questions and inform of alternative modalities patient is using for pain.  Notify RN Care Manager of TOC call rescheduling needs Participate in Transition of Care Program/Attend TOC scheduled calls Take medications as prescribed   Monitor incision for Redness, drainage, or swelling: no issues reported other than some scar tissue forming that was discussed with provider on 01/19/24. Leave steri-strips on neck. They will fall off by themselves. Do not put any creams, lotions, or ointments on incision. You are fine to shower. Let water run over incision and pat dry. Walk each and every day, increasing distance each day. No lifting greater than 8 lbs. Avoid excessive neck motion. No driving, or riding a car unless coming back and forth to see the doctor  Call Your Doctor if these occur Redness, drainage, or swelling at the wound. Temperature greater than 101 degrees. Severe pain not relieved by pain medication: Has seen neurosurgeon on 01/19/24 for  sustained pain.  Incision starts to come apart.  Plan:  The patient has been provided with contact information for the care management team and has been advised to call with any health related questions or concerns.            Telephone follow up  appointment with care management team member scheduled for: Our next appointment is by telephone on 01/27/24 at 1130 am  Please call the care guide team at [phone] if you need to cancel or reschedule your appointment.   Please call the Suicide and Crisis Lifeline: 988 if in crisis.  Call the [place]  National Suicide Prevention Lifeline: (704)578-2280 or TTY: (302)871-7589 TTY 361-808-2352) to talk to a trained counselor Call 1-800-273-TALK (toll free, 24 hour hotline) Go to [facility] [address] [place] [phone]) if you are experiencing a Mental Health or Behavioral Health Crisis or need someone to talk to.   Bing Edison MSN, RN RN Case Sales [facility]  VBCI-Population Health Office Hours M-F [phone] Direct Dial: [phone] Main Phone [phone]  Fax: [phone] [email or web address]

## 2024-01-21 ENCOUNTER — Encounter (HOSPITAL_COMMUNITY): Payer: Self-pay

## 2024-01-21 ENCOUNTER — Other Ambulatory Visit (HOSPITAL_COMMUNITY): Payer: Self-pay

## 2024-01-21 DIAGNOSIS — Z23 Encounter for immunization: Secondary | ICD-10-CM | POA: Diagnosis not present

## 2024-01-25 ENCOUNTER — Other Ambulatory Visit (HOSPITAL_COMMUNITY): Payer: Self-pay

## 2024-01-25 ENCOUNTER — Other Ambulatory Visit: Payer: Self-pay

## 2024-01-25 MED ORDER — LOSARTAN POTASSIUM 100 MG PO TABS
100.0000 mg | ORAL_TABLET | Freq: Every day | ORAL | 2 refills | Status: AC
  Filled 2024-01-25: qty 30, 30d supply, fill #0
  Filled 2024-02-19: qty 30, 30d supply, fill #1
  Filled 2024-03-23: qty 30, 30d supply, fill #2

## 2024-01-25 MED ORDER — VALACYCLOVIR HCL 1 G PO TABS
1.0000 g | ORAL_TABLET | Freq: Two times a day (BID) | ORAL | 0 refills | Status: AC | PRN
  Filled 2024-01-25: qty 20, 10d supply, fill #0

## 2024-01-26 ENCOUNTER — Other Ambulatory Visit (HOSPITAL_COMMUNITY): Payer: Self-pay

## 2024-01-26 MED ORDER — TIZANIDINE HCL 4 MG PO TABS
4.0000 mg | ORAL_TABLET | Freq: Four times a day (QID) | ORAL | 0 refills | Status: DC | PRN
  Filled 2024-01-26: qty 40, 10d supply, fill #0

## 2024-01-27 ENCOUNTER — Telehealth: Payer: Self-pay

## 2024-01-27 DIAGNOSIS — M5481 Occipital neuralgia: Secondary | ICD-10-CM | POA: Diagnosis not present

## 2024-01-27 DIAGNOSIS — M25511 Pain in right shoulder: Secondary | ICD-10-CM | POA: Diagnosis not present

## 2024-01-28 ENCOUNTER — Encounter: Payer: Self-pay | Admitting: Cardiology

## 2024-01-28 ENCOUNTER — Other Ambulatory Visit (HOSPITAL_COMMUNITY): Payer: Self-pay

## 2024-01-28 ENCOUNTER — Telehealth: Payer: Self-pay

## 2024-01-28 NOTE — Transitions of Care (Post Inpatient/ED Visit) (Signed)
 Transition of Care Week 6  Visit Note  01/28/2024  Name: Amanda Kim MRN: [id number]          DOB: [birth year]  Situation: Patient enrolled in [facility] 30-day program. Visit completed with patient Amanda Kim by telephone.   Background:   Initial Transition Care Management Follow-up Telephone Call Discharge Date and Diagnosis: 12/12/23, Spinal stenosis of cervical region with radiculopathy   Past Medical History:  Diagnosis Date   Back pain    d/t horse fell on pt in the 80's   Bone spur    in neck(since 1990) and 1st 3 fingers on right hand go numb   Chronic pain    d/t horse accident in the 80's   Headache(784.0)    tension   History of colon polyps    Hyperlipidemia    takes Fish Oil and CoQ10   Hypertension    takes Inderal  daily   Hypothyroid 05/07/2011   takes Synthroid  daily   Leg cramps    takes Potassium prn   Liver disease    nonalcholic fatty liver disease   Myocardial infarction (HCC)    Nausea & vomiting    phenergan  and zofran  prn   PONV (postoperative nausea and vomiting)    Spondylolisthesis    Spondylosis of thoracolumbar region without myelopathy or radiculopathy    spine   Urinary frequency     Assessment: Patient Reported Symptoms: Cognitive Cognitive Status: Alert and oriented to person, place, and time, Normal speech and language skills      Neurological [place] Neurological Symptoms/Conditions [RPT]: s/p Spinal Stenosis- patient reports she is doing much better. Only taking ain medication about twice a day. Neurological Management Strategies: Medication therapy, Routine screening Neurological Self-Management Outcome: 4 (good)  HEENT HEENT Symptoms Reported: No symptoms reported      Cardiovascular Cardiovascular Symptoms Reported: No symptoms reported    Respiratory Respiratory Symptoms Reported: No symptoms reported    Endocrine Endocrine Symptoms Reported: No symptoms reported    Gastrointestinal Gastrointestinal Symptoms  Reported: No symptoms reported      Genitourinary Genitourinary Symptoms Reported: No symptoms reported    Integumentary Integumentary Symptoms Reported: Incision Additional Integumentary Details: No signs of infection Skin Management Strategies: Routine screening  Musculoskeletal Musculoskelatal Symptoms Reviewed: Limited mobility Musculoskeletal Management Strategies: Activity Musculoskeletal Self-Management Outcome: 4 (good)      Psychosocial Psychosocial Symptoms Reported: No symptoms reported Additional Psychological Details: Patient reports feeling well and is back with her therapist for counseling Behavioral Management Strategies: Complementary therapy(ies) Behavioral Health Self-Management Outcome: 4 (good)       There were no vitals filed for this visit. Pain Scale: 0-10 Pain Score: 0-No pain  Medications Reviewed Today     Reviewed by Ronel Rodeheaver, RN (Case Manager) on 01/28/24 at 1057  Med List Status: <None>   Medication Order Taking? Sig Documenting Provider Last Dose Status Informant  acetaminophen  (TYLENOL ) 500 MG tablet [id number]  Take 500-1,000 mg by mouth every 6 (six) hours as needed (pain.). [provider]  Active Self   Patient not taking:    Discontinued 01/21/24 1302 (Patient has not taken in last 30 days)          Med Note>> Feliciano Laymon LABOR, [facility]   01/21/2024  1:02 PM Per patient, no longer taking (sent us  mychart msg 12.4.25 - BB)    clopidogrel  (PLAVIX ) 75 MG tablet [id number]  Take 1 tablet (75 mg total) by mouth daily. Ladona Heinz, MD  Active   diclofenac  Sodium (VOLTAREN   ARTHRITIS PAIN) 1 % GEL [id number]  Apply 1 Application topically 4 (four) times daily as needed (pain.). [provider]  Active Self  DULoxetine  (CYMBALTA ) 60 MG capsule [id number]  Take 1 capsule (60 mg total) by mouth daily.   Active Self  Estradiol  (VAGIFEM ) 10 MCG TABS vaginal tablet [id number]  Place 10 mcg vaginally 2 (two) times a week. [provider]   Active Self  hydrALAZINE  (APRESOLINE ) 50 MG tablet [id number]  Take 1 tablet (50 mg total) by mouth 3 (three) times daily. Ladona Heinz, MD  Active   hydrOXYzine  (ATARAX ) 25 MG tablet [id number]  Take 25 mg by mouth 3 (three) times daily as needed for anxiety (or sleep). [provider]  Active Self  isosorbide  dinitrate (ISORDIL ) 30 MG tablet [id number]  Take 1 tablet (30 mg total) by mouth 3 (three) times daily. Ladona Heinz, MD  Active   levothyroxine  (SYNTHROID ) 100 MCG tablet [id number]  Take 1 tablet (100 mcg total) by mouth in the morning on an empty stomach.   Active Self  levothyroxine  (SYNTHROID ) 100 MCG tablet [id number]  Take 1 tablet (100 mcg total) by mouth every morning on an empty stomach.   Active Self  lidocaine  (LIDODERM ) 5 % [id number]  Apply 1 patch to skin per package directions, leave on most painful area for up to 12 hours. Max 1 patch daily  Patient taking differently: Place 1 patch onto the skin daily as needed (pain.).     Active Self  losartan  (COZAAR ) 100 MG tablet [id number]  Take 1 tablet (100 mg total) by mouth daily.   Active   metoprolol  succinate (TOPROL  XL) 25 MG 24 hr tablet [id number]  Take 1 tablet (25 mg total) by mouth daily. Ladona Heinz, MD  Active Self  nitroGLYCERIN  (NITROSTAT ) 0.4 MG SL tablet [id number]  Place 1 tablet (0.4 mg total) under the tongue every 5 (five) minutes as needed for chest pain. Ladona Heinz, MD  Active Self  OVER THE COUNTER MEDICATION [id number]  Take 1 Dose by mouth at bedtime. Sound Asleep Gummy [provider]  Active Self, Pharmacy Records  Oxycodone  HCl 10 MG TABS [id number]  Take 1 tablet (10 mg total) by mouth every 6 (six) hours as needed for pain.   Active   oxyCODONE -acetaminophen  (PERCOCET) 7.5-325 MG tablet [id number]  Take 1 tablet by mouth 4 (four) times daily as needed for pain   Active   oxyCODONE -acetaminophen  (PERCOCET/ROXICET) 5-325 MG tablet [id number]  Take 1 tablet by mouth 5 (five) times daily as needed for pain.    Active Self  polyethylene glycol (MIRALAX / GLYCOLAX) 17 g packet [id number]  Take 17 g by mouth daily as needed for moderate constipation. [provider]  Active   rosuvastatin  (CRESTOR ) 20 MG tablet [id number]  Take 1 tablet (20 mg total) by mouth daily. Madie Jon Garre, PA  Active Self  Tiotropium Bromide  Monohydrate (SPIRIVA  RESPIMAT) 1.25 MCG/ACT AERS [id number]  Inhale 2 puffs into the lungs daily. Geronimo Amel, MD  Active Self  tiZANidine  (ZANAFLEX ) 4 MG tablet [id number]  Take 1 tablet (4 mg total) by mouth every 6 (six) hours as needed for muscle spasms.  Patient not taking: Reported on 01/20/2024   Gillie Duncans, MD  Active   tiZANidine  (ZANAFLEX ) 4 MG tablet [id number]  Take 1 tablet (4 mg total) by mouth every 6 (six) hours as needed for muscle spasms.   Active   valACYclovir  (VALTREX ) 1000 MG tablet [id number]  Take 1,000 mg by mouth 2 (two)  times daily as needed ([place]). [provider]  Active Self, Pharmacy Records           Med Note CLAUD, MICHEAL ONEIDA Schaumann Dec 03, 2023 12:18 PM)    valACYclovir  (VALTREX ) 1000 MG tablet [id number]  Take 1 tablet (1,000 mg total) by mouth 2 (two) times daily as needed for cold sores.   Active   Vitamin D -Vitamin K (D3 + K2 PO) [id number]  Take 1 tablet by mouth daily. [provider]  Active Self            Goals Addressed             This Visit's Progress    COMPLETED: [facility] Transitions of Care (TOC) Care Plan       01/20/24 TOC RN CM reviewed and discussed with patient.   Problems:  Recent Hospitalization for treatment of Spinal stenosis of cervical region with radiculopathy Knowledge Deficit Related to Spinal stenosis of cervical region with radiculopathy/s/p Anterior cervical discectomies and fusion and C3-4 C4-5 C5-6  Goal:  Over the next 30 days, the patient will not experience hospital readmission  Interventions:  Transitions of Care: 01/05/24 Patient reports she is doing better.  Doing more and  feeling better and doing gentle yoga.  Pain 7/10 today. Taking pain medication and muscle relaxer as needed.  No issues with incision.  Discussed importance of pacing self with activities.  Follow up with surgeon 12/2.   01/20/24: TOC week 5 outreach:  Patient reports feeling worse than before surgery with constant pain in neck, Right shoulder pain and weakness, constant headaches.   Saw neurosurgeon on 01/19/24, X-rays reviewed, nothing out of place, no real explanation for pain, referred for steroid injection patient will follow up with.  Reviewed modalities patient is using such as massage, acupuncture, cupping, medications, support system, therapy, antidepressant.  Reviewed medications list, no changes since last outreach.  Discussed, allowed ventilation and expression of feelings of frustration and depression over ongoing pain, loss of 8 yr old dog last week, depression screening, review of therapy and medications.  Has therapy appointment today at 1 pm.  History of suicide in direct family members,  Discussed/allowed time for patient to express thoughts, does not express suicidal intent or a plan, but encouraged to discuss with therapist and reach out to suicide/crisis resources listed in AVS.  Good support at home with significant other.  Requests another follow up outreach next week.   01/28/24 Patient reports she is doing much better.  She reports pain less.  Only taking  pain medication in the am and pm.  Still grieving loss of her pet.  Back with her therapist for counseling.  Reports also having family and friend support.  Discussed further outreach.  Patient declined at this time.  Doctor Visits  - discussed the importance of doctor visits Post discharge activity limitations prescribed by provider reviewed Reviewed Signs and symptoms of infection  Patient Self Care Activities:   Attend all scheduled provider appointments. Continue self care activities for pain management.  Call  pharmacy for medication refills 3-7 days in advance of running out of medications Call provider office for new concerns or questions and inform of alternative modalities patient is using for pain.  Notify RN Care Manager of TOC call rescheduling needs Participate in Transition of Care Program/Attend TOC scheduled calls Take medications as prescribed   Monitor incision for Redness, drainage, or swelling: no issues reported other than some scar tissue forming that was discussed  with provider on 01/19/24. Leave steri-strips on neck. They will fall off by themselves. Do not put any creams, lotions, or ointments on incision. You are fine to shower. Let water run over incision and pat dry. Walk each and every day, increasing distance each day. No lifting greater than 8 lbs. Avoid excessive neck motion. No driving, or riding a car unless coming back and forth to see the doctor  Call Your Doctor if these occur Redness, drainage, or swelling at the wound. Temperature greater than 101 degrees. Severe pain not relieved by pain medication: Has seen neurosurgeon on 01/19/24 for sustained pain.  Incision starts to come apart.  Plan:  The patient has been provided with contact information for the care management team and has been advised to call with any health related questions or concerns.         Recommendation:   Continue Current Plan of Care  Follow Up Plan:   Closing From:  Transitions of Care Program Patient has met goals of TOC program.  Declines further follow up.   Adysson Revelle J. Kandie Keiper RN, MSN [facility], [facility] RN Care Manager Direct Dial: [phone]  Fax: [phone] Website: [email or web address]

## 2024-01-28 NOTE — Patient Instructions (Signed)
 Visit Information  Thank you for taking time to visit with me today. Please don't hesitate to contact me if I can be of assistance to you.   Following is a copy of your care plan:   Goals Addressed             This Visit's Progress    COMPLETED: [facility] Transitions of Care (TOC) Care Plan       01/20/24 TOC RN CM reviewed and discussed with patient.   Problems:  Recent Hospitalization for treatment of Spinal stenosis of cervical region with radiculopathy Knowledge Deficit Related to Spinal stenosis of cervical region with radiculopathy/s/p Anterior cervical discectomies and fusion and C3-4 C4-5 C5-6  Goal:  Over the next 30 days, the patient will not experience hospital readmission  Interventions:  Transitions of Care: 01/05/24 Patient reports she is doing better.  Doing more and feeling better and doing gentle yoga.  Pain 7/10 today. Taking pain medication and muscle relaxer as needed.  No issues with incision.  Discussed importance of pacing self with activities.  Follow up with surgeon 12/2.   01/20/24: TOC week 5 outreach:  Patient reports feeling worse than before surgery with constant pain in neck, Right shoulder pain and weakness, constant headaches.   Saw neurosurgeon on 01/19/24, X-rays reviewed, nothing out of place, no real explanation for pain, referred for steroid injection patient will follow up with.  Reviewed modalities patient is using such as massage, acupuncture, cupping, medications, support system, therapy, antidepressant.  Reviewed medications list, no changes since last outreach.  Discussed, allowed ventilation and expression of feelings of frustration and depression over ongoing pain, loss of 34 yr old dog last week, depression screening, review of therapy and medications.  Has therapy appointment today at 1 pm.  History of suicide in direct family members,  Discussed/allowed time for patient to express thoughts, does not express suicidal intent or a plan, but  encouraged to discuss with therapist and reach out to suicide/crisis resources listed in AVS.  Good support at home with significant other.  Requests another follow up outreach next week.   01/28/24 Patient reports she is doing much better.  She reports pain less.  Only taking  pain medication in the am and pm.  Still grieving loss of her pet.  Back with her therapist for counseling.  Reports also having family and friend support.  Discussed further outreach.  Patient declined at this time.  Doctor Visits  - discussed the importance of doctor visits Post discharge activity limitations prescribed by provider reviewed Reviewed Signs and symptoms of infection  Patient Self Care Activities:   Attend all scheduled provider appointments. Continue self care activities for pain management.  Call pharmacy for medication refills 3-7 days in advance of running out of medications Call provider office for new concerns or questions and inform of alternative modalities patient is using for pain.  Notify RN Care Manager of TOC call rescheduling needs Participate in Transition of Care Program/Attend TOC scheduled calls Take medications as prescribed   Monitor incision for Redness, drainage, or swelling: no issues reported other than some scar tissue forming that was discussed with provider on 01/19/24. Leave steri-strips on neck. They will fall off by themselves. Do not put any creams, lotions, or ointments on incision. You are fine to shower. Let water run over incision and pat dry. Walk each and every day, increasing distance each day. No lifting greater than 8 lbs. Avoid excessive neck motion. No driving, or riding a car  unless coming back and forth to see the doctor  Call Your Doctor if these occur Redness, drainage, or swelling at the wound. Temperature greater than 101 degrees. Severe pain not relieved by pain medication: Has seen neurosurgeon on 01/19/24 for sustained pain.  Incision starts to  come apart.  Plan:  The patient has been provided with contact information for the care management team and has been advised to call with any health related questions or concerns.         Patient verbalizes understanding of instructions and care plan provided today and agrees to view in MyChart. Active MyChart status and patient understanding of how to access instructions and care plan via MyChart confirmed with patient.     The patient has been provided with contact information for the care management team and has been advised to call with any health related questions or concerns.   Please call the care guide team at [phone] if you need to cancel or reschedule your appointment.   Please call the Suicide and Crisis Lifeline: 988 if you are experiencing a Mental Health or Behavioral Health Crisis or need someone to talk to.  Pristine Gladhill J. Markita Stcharles RN, MSN [facility], [facility] RN Care Manager Direct Dial: [phone]  Fax: [phone] Website: [email or web address]

## 2024-02-01 ENCOUNTER — Telehealth: Payer: Self-pay | Admitting: Cardiology

## 2024-02-01 NOTE — Telephone Encounter (Signed)
°  Per MyChart scheduling message:  Pt c/o BP issue: STAT if pt c/o blurred vision, one-sided weakness or slurred speech.  STAT if BP is GREATER than 180/120 TODAY.  STAT if BP is LESS than 90/60 and SYMPTOMATIC TODAY  1. What is your BP concern?   2. Have you taken any BP medication today?  3. What are your last 5 BP readings?  4. Are you having any other symptoms (ex. Dizziness, headache, blurred vision, passed out)?    BP was an issue this Fall. Went to ER in [place] because I couldnt get BP under 200.    BP lately   123/65 157/86 169/87 146/85 175/91   Dizzy upon rising. Heartbeat irregular at times. Headache has been constant since surgery( got cortisone shot a week ago in head.

## 2024-02-01 NOTE — Telephone Encounter (Signed)
 I spoke with patient.  She reports she received message she was due for appointment.  Six month follow up has been scheduled for 04/12/24.  Patient also sent the information below regarding her BP. Patient reports she has been having a headache since her neck surgery.  Received a cortisone shot last Wednesday.  BP has been up and down prior to receiving this injection. Headache has improved some after injection. Patient keeps record of BP at home and will send these reading in through my chart.

## 2024-02-02 ENCOUNTER — Other Ambulatory Visit: Payer: Self-pay

## 2024-02-02 ENCOUNTER — Other Ambulatory Visit (HOSPITAL_COMMUNITY): Payer: Self-pay

## 2024-02-02 MED ORDER — OXYCODONE-ACETAMINOPHEN 7.5-325 MG PO TABS
1.0000 | ORAL_TABLET | Freq: Four times a day (QID) | ORAL | 0 refills | Status: AC | PRN
  Filled 2024-02-02: qty 120, 30d supply, fill #0

## 2024-02-03 ENCOUNTER — Encounter: Payer: Self-pay | Admitting: *Deleted

## 2024-02-03 NOTE — Telephone Encounter (Signed)
 My chart message sent to patient to remind her to send in blood pressure readings.

## 2024-02-08 ENCOUNTER — Other Ambulatory Visit: Payer: Self-pay

## 2024-02-08 ENCOUNTER — Other Ambulatory Visit (HOSPITAL_COMMUNITY): Payer: Self-pay

## 2024-02-17 ENCOUNTER — Other Ambulatory Visit (HOSPITAL_COMMUNITY): Payer: Self-pay

## 2024-02-19 ENCOUNTER — Other Ambulatory Visit: Payer: Self-pay

## 2024-02-19 ENCOUNTER — Other Ambulatory Visit (HOSPITAL_COMMUNITY): Payer: Self-pay

## 2024-02-19 MED ORDER — DULOXETINE HCL 60 MG PO CPEP
60.0000 mg | ORAL_CAPSULE | Freq: Every day | ORAL | 1 refills | Status: AC
  Filled 2024-02-19 – 2024-03-05 (×2): qty 90, 90d supply, fill #0

## 2024-02-19 MED ORDER — TIZANIDINE HCL 4 MG PO TABS
4.0000 mg | ORAL_TABLET | Freq: Four times a day (QID) | ORAL | 0 refills | Status: AC | PRN
  Filled 2024-02-19 – 2024-02-25 (×2): qty 40, 10d supply, fill #0

## 2024-02-20 ENCOUNTER — Other Ambulatory Visit (HOSPITAL_COMMUNITY): Payer: Self-pay

## 2024-02-22 ENCOUNTER — Other Ambulatory Visit (HOSPITAL_COMMUNITY): Payer: Self-pay

## 2024-02-22 ENCOUNTER — Other Ambulatory Visit: Payer: Self-pay

## 2024-02-23 ENCOUNTER — Other Ambulatory Visit (HOSPITAL_COMMUNITY): Payer: Self-pay

## 2024-02-25 ENCOUNTER — Other Ambulatory Visit (HOSPITAL_COMMUNITY): Payer: Self-pay

## 2024-02-25 ENCOUNTER — Other Ambulatory Visit: Payer: Self-pay

## 2024-02-26 ENCOUNTER — Other Ambulatory Visit (HOSPITAL_COMMUNITY): Payer: Self-pay

## 2024-03-04 ENCOUNTER — Other Ambulatory Visit (HOSPITAL_COMMUNITY): Payer: Self-pay

## 2024-03-04 MED ORDER — OXYCODONE-ACETAMINOPHEN 5-325 MG PO TABS
1.0000 | ORAL_TABLET | Freq: Four times a day (QID) | ORAL | 0 refills | Status: DC | PRN
  Filled 2024-03-04: qty 40, 10d supply, fill #0

## 2024-03-05 ENCOUNTER — Other Ambulatory Visit (HOSPITAL_COMMUNITY): Payer: Self-pay

## 2024-03-07 ENCOUNTER — Other Ambulatory Visit (HOSPITAL_COMMUNITY): Payer: Self-pay

## 2024-03-07 ENCOUNTER — Other Ambulatory Visit: Payer: Self-pay

## 2024-03-11 ENCOUNTER — Other Ambulatory Visit (HOSPITAL_COMMUNITY): Payer: Self-pay

## 2024-03-11 MED ORDER — OXYCODONE-ACETAMINOPHEN 5-325 MG PO TABS: 1.0000 | ORAL_TABLET | Freq: Four times a day (QID) | ORAL | 0 refills | Status: AC | PRN

## 2024-03-17 ENCOUNTER — Other Ambulatory Visit (HOSPITAL_COMMUNITY): Payer: Self-pay

## 2024-03-17 ENCOUNTER — Other Ambulatory Visit: Payer: Self-pay

## 2024-03-17 MED ORDER — AMOXICILLIN 500 MG PO CAPS
500.0000 mg | ORAL_CAPSULE | Freq: Three times a day (TID) | ORAL | 0 refills | Status: AC
  Filled 2024-03-17: qty 21, 7d supply, fill #0

## 2024-03-18 ENCOUNTER — Other Ambulatory Visit (HOSPITAL_COMMUNITY): Payer: Self-pay

## 2024-03-18 ENCOUNTER — Other Ambulatory Visit: Payer: Self-pay

## 2024-04-12 ENCOUNTER — Ambulatory Visit: Admitting: Cardiology
